# Patient Record
Sex: Female | Born: 1990 | Race: White | Hispanic: No | Smoking: Former smoker
Health system: Southern US, Community
[De-identification: ages and names within clinical notes are randomized; demographics above are authoritative.]

## PROBLEM LIST (undated history)

## (undated) DIAGNOSIS — I10 Essential (primary) hypertension: Secondary | ICD-10-CM

## (undated) DIAGNOSIS — J45909 Unspecified asthma, uncomplicated: Secondary | ICD-10-CM

## (undated) DIAGNOSIS — F329 Major depressive disorder, single episode, unspecified: Secondary | ICD-10-CM

## (undated) HISTORY — PX: TUBAL LIGATION: SHX77

---
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
## 2012-02-27 ENCOUNTER — Ambulatory Visit
Admission: RE | Admit: 2012-02-27 | Discharge: 2012-02-27 | Disposition: A | Discharge status: Home / Self Care | Payer: BC Managed Care – PPO | Source: Ambulatory Visit | Attending: Family Medicine | Admitting: Family Medicine

## 2012-02-27 ENCOUNTER — Other Ambulatory Visit: Payer: Self-pay | Admitting: Family Medicine

## 2012-02-27 DIAGNOSIS — R1031 Right lower quadrant pain: Secondary | ICD-10-CM

## 2012-02-27 MED ORDER — IOHEXOL 300 MG/ML  SOLN
125.0000 mL | Freq: Once | INTRAMUSCULAR | Status: AC | PRN
  Administered 2012-02-27: 125 mL via INTRAVENOUS

## 2017-08-31 ENCOUNTER — Emergency Department (HOSPITAL_COMMUNITY)
Admission: EM | Admit: 2017-08-31 | Discharge: 2017-09-01 | Disposition: A | Discharge status: Other Acute Inpatient Hospital | Payer: Self-pay | Attending: Emergency Medicine | Admitting: Emergency Medicine

## 2017-08-31 ENCOUNTER — Encounter (HOSPITAL_COMMUNITY): Payer: Self-pay

## 2017-08-31 DIAGNOSIS — T1491XA Suicide attempt, initial encounter: Secondary | ICD-10-CM

## 2017-08-31 DIAGNOSIS — Z79899 Other long term (current) drug therapy: Secondary | ICD-10-CM | POA: Insufficient documentation

## 2017-08-31 DIAGNOSIS — T426X2A Poisoning by other antiepileptic and sedative-hypnotic drugs, intentional self-harm, initial encounter: Secondary | ICD-10-CM | POA: Insufficient documentation

## 2017-08-31 DIAGNOSIS — T391X2A Poisoning by 4-Aminophenol derivatives, intentional self-harm, initial encounter: Secondary | ICD-10-CM | POA: Insufficient documentation

## 2017-08-31 DIAGNOSIS — I10 Essential (primary) hypertension: Secondary | ICD-10-CM | POA: Insufficient documentation

## 2017-08-31 DIAGNOSIS — R000 Tachycardia, unspecified: Secondary | ICD-10-CM | POA: Insufficient documentation

## 2017-08-31 DIAGNOSIS — T438X2A Poisoning by other psychotropic drugs, intentional self-harm, initial encounter: Secondary | ICD-10-CM | POA: Insufficient documentation

## 2017-08-31 DIAGNOSIS — T50902A Poisoning by unspecified drugs, medicaments and biological substances, intentional self-harm, initial encounter: Secondary | ICD-10-CM

## 2017-08-31 DIAGNOSIS — Z915 Personal history of self-harm: Secondary | ICD-10-CM | POA: Insufficient documentation

## 2017-08-31 DIAGNOSIS — T424X2A Poisoning by benzodiazepines, intentional self-harm, initial encounter: Secondary | ICD-10-CM | POA: Insufficient documentation

## 2017-08-31 HISTORY — DX: Essential (primary) hypertension: I10

## 2017-08-31 LAB — CBC WITH DIFFERENTIAL/PLATELET
BASOS PCT: 0 %
Basophils Absolute: 0 10*3/uL (ref 0.0–0.1)
EOS ABS: 0.2 10*3/uL (ref 0.0–0.7)
Eosinophils Relative: 2 %
HCT: 35.2 % — ABNORMAL LOW (ref 36.0–46.0)
HEMOGLOBIN: 11.4 g/dL — AB (ref 12.0–15.0)
Lymphocytes Relative: 23 %
Lymphs Abs: 2.1 10*3/uL (ref 0.7–4.0)
MCH: 26.8 pg (ref 26.0–34.0)
MCHC: 32.4 g/dL (ref 30.0–36.0)
MCV: 82.8 fL (ref 78.0–100.0)
Monocytes Absolute: 0.5 10*3/uL (ref 0.1–1.0)
Monocytes Relative: 6 %
NEUTROS PCT: 69 %
Neutro Abs: 6.5 10*3/uL (ref 1.7–7.7)
Platelets: 323 10*3/uL (ref 150–400)
RBC: 4.25 MIL/uL (ref 3.87–5.11)
RDW: 14.6 % (ref 11.5–15.5)
WBC: 9.3 10*3/uL (ref 4.0–10.5)

## 2017-08-31 MED ORDER — AMMONIA AROMATIC IN INHA
RESPIRATORY_TRACT | Status: AC
  Filled 2017-08-31: qty 10

## 2017-08-31 MED ORDER — SODIUM CHLORIDE 0.9 % IV BOLUS (SEPSIS)
1000.0000 mL | Freq: Once | INTRAVENOUS | Status: AC
  Administered 2017-08-31: 1000 mL via INTRAVENOUS

## 2017-08-31 NOTE — ED Notes (Signed)
ED Provider at bedside. 

## 2017-08-31 NOTE — ED Notes (Signed)
Poison control recommendations recheck tylenol at 4 hours of ingestion, check aspirin level, ekg, and iv fluids. They stated if she had seizures to give pt ativan and observe pt for 6-8 hours.

## 2017-08-31 NOTE — ED Triage Notes (Signed)
Pt stated that she took was under stress and was having a panic attack so she took 24 Tylenol PM, 10 gabapentin, 3 Klonopin and 1 Ambien. Pt stated she was not tryng to kill or harm herself.

## 2017-08-31 NOTE — ED Provider Notes (Signed)
[facility] DEPT Provider Note   CSN: [id number] Arrival date & time: 08/31/17  2215     History   Chief Complaint Chief Complaint  Patient presents with  . Drug Overdose    HPI Veronica Waters is a 26 y.o. female.  HPI   26 year old female with history of hypertension, seizure brought here via EMS from home for drug overdose. history is limited as patient is a poor historian.  Patient states that she was having a panic attack due to dealing with a recent divorce and therefore she took much medication to "calm me down". EMS documented that she took 24 Tylenol PM, 10 gabapentin, 3 Klonopin, and 1 Ambien. Patient currently denies intentional overdose to kill herself but does admit that she has tried to intentionally overdose with intention of harming herself 3 separate times in the past 6 months. She denies homicidal ideation, having auditory or visual hallucination. Denies any recent alcohol or illicit drug use. When asked if she is any pain, patient denies. Patient however does appear drowsy. Level 5 caveats applies  Past Medical History:  Diagnosis Date  . Hypertension   . Seizure (HCC)     There are no active problems to display for this patient.   Past Surgical History:  Procedure Laterality Date  . TUBAL LIGATION      OB History    No data available       Home Medications    Prior to Admission medications   Medication Sig Start Date End Date Taking? Authorizing Provider  ARIPiprazole (ABILIFY) 15 MG tablet Take 15 mg by mouth daily.   Yes [provider]  gabapentin (NEURONTIN) 800 MG tablet Take 800 mg by mouth 3 (three) times daily.   Yes [provider]  lisinopril-hydrochlorothiazide (PRINZIDE,ZESTORETIC) 20-12.5 MG tablet Take 1 tablet by mouth daily.   Yes [provider]    Family History History reviewed. No pertinent family history.  Social History Social History  Substance Use Topics  . Smoking status: Never Smoker  .  Smokeless tobacco: Never Used  . Alcohol use No     Allergies   Aspirin; Cranberry; and Vicodin [hydrocodone-acetaminophen]   Review of Systems Review of Systems  Unable to perform ROS: Acuity of condition     Physical Exam Updated Vital Signs BP 115/67 (BP Location: Left Arm)   Pulse (!) 117   Temp 98.1 F (36.7 C) (Oral)   Wt 131.5 kg (290 lb)   LMP  (LMP Unknown)   SpO2 97%   Physical Exam  Constitutional: She is oriented to person, place, and time. She appears well-developed and well-nourished. No distress.  Morbidly obese female laying in bed with eyes closed, easily arousable and answering questions.  HENT:  Head: Atraumatic.  Mouth/Throat: Oropharynx is clear and moist.  Eyes: Conjunctivae are normal.  Pupils are equal, dilated but reactive bilaterally  Neck: Normal range of motion. Neck supple.  No nuchal rigidity  Cardiovascular:  Tachycardia without murmurs rubs or gallops  Pulmonary/Chest:  Breathing with poor effort but no wheezes, rales or rhonchi  Abdominal: Soft. Bowel sounds are normal. She exhibits no distension. There is no tenderness.  Neurological: She is alert and oriented to person, place, and time. No cranial nerve deficit or sensory deficit. GCS eye subscore is 4. GCS verbal subscore is 5. GCS motor subscore is 6.  Able to move all 4 extremities with poor effort  Skin: No rash noted.  Psychiatric: Her affect is blunt. Her speech is delayed.  She is slowed. Thought content is not paranoid. She expresses no homicidal and no suicidal ideation.  Nursing note and vitals reviewed.    ED Treatments / Results  Labs (all labs ordered are listed, but only abnormal results are displayed) Labs Reviewed  CBC WITH DIFFERENTIAL/PLATELET - Abnormal; Notable for the following:       Result Value   Hemoglobin 11.4 (*)    HCT 35.2 (*)    All other components within normal limits  COMPREHENSIVE METABOLIC PANEL - Abnormal; Notable for the following:     Glucose, Bld 115 (*)    Calcium 8.4 (*)    Albumin 3.2 (*)    All other components within normal limits  ETHANOL  SALICYLATE LEVEL  ACETAMINOPHEN LEVEL  PREGNANCY, URINE  URINALYSIS, ROUTINE W REFLEX MICROSCOPIC  RAPID URINE DRUG SCREEN, HOSP PERFORMED    EKG  EKG Interpretation  Date/Time:  Saturday August 31 2017 22:32:33 EDT Ventricular Rate:  118 PR Interval:    QRS Duration: 90 QT Interval:  320 QTC Calculation: 449 R Axis:   74 Text Interpretation:  Sinus tachycardia Borderline T wave abnormalities No previous ECGs available Confirmed by Glynn Octave [phone]) on 08/31/2017 11:33:02 PM       Radiology No results found.  Procedures Procedures (including critical care time)  Medications Ordered in ED Medications  ammonia inhalant (not administered)  sodium chloride 0.9 % bolus 1,000 mL (0 mLs Intravenous Stopped 09/01/17 0008)     Initial Impression / Assessment and Plan / ED Course  I have reviewed the triage vital signs and the nursing notes.  Pertinent labs & imaging results that were available during my care of the patient were reviewed by me and considered in my medical decision making (see chart for details).     BP (!) 127/48   Pulse (!) 106   Temp 98.1 F (36.7 C) (Oral)   Resp 20   Wt 131.5 kg (290 lb)   LMP  (LMP Unknown)   SpO2 96%    Final Clinical Impressions(s) / ED Diagnoses   Final diagnoses:  Intentional drug overdose, initial encounter [facility])    New Prescriptions New Prescriptions   No medications on file   11:01 PM Pt here for evaluation of drug overdose.  Report hx of intentional drug overdose with self harm.  Nurse have contacted poison control who recommend recheck tylenol level at 4 hrs of ingestion, check aspirin level, EKG and IVF.  If pt develop seizure, then give ativan.  Pt to be observe for 6-8 hrs.    Pt is currently protecting her airway, she is tachycardic, IVF given.  Care discussed with Dr. Manus Gunning.    1:08  AM Pt sts she took her meds around 7pm.  However, questionable history.  Plan to recheck tylenol level at 2am.  Will continue monitoring and once pt is medically cleared she will need further evaluation by TTS.  She may need IVC paper.  Care sign out to Dr. Manus Gunning.     Fayrene Helper, PA-C 09/01/17 Gean Maidens, MD 09/01/17 [phone]

## 2017-08-31 NOTE — ED Notes (Signed)
Pt able to state place, year, and but not month, pt believes it's August

## 2017-09-01 ENCOUNTER — Inpatient Hospital Stay (HOSPITAL_COMMUNITY)
Admission: AD | Admit: 2017-09-01 | Discharge: 2017-09-05 | DRG: 885 | Disposition: A | Discharge status: Home / Self Care | Payer: No Typology Code available for payment source | Source: Intra-hospital | Attending: Psychiatry | Admitting: Psychiatry

## 2017-09-01 ENCOUNTER — Encounter (HOSPITAL_COMMUNITY): Payer: Self-pay | Admitting: *Deleted

## 2017-09-01 DIAGNOSIS — Z79899 Other long term (current) drug therapy: Secondary | ICD-10-CM | POA: Diagnosis not present

## 2017-09-01 DIAGNOSIS — F4310 Post-traumatic stress disorder, unspecified: Secondary | ICD-10-CM | POA: Diagnosis present

## 2017-09-01 DIAGNOSIS — G4700 Insomnia, unspecified: Secondary | ICD-10-CM | POA: Diagnosis present

## 2017-09-01 DIAGNOSIS — F603 Borderline personality disorder: Secondary | ICD-10-CM | POA: Diagnosis present

## 2017-09-01 DIAGNOSIS — Z635 Disruption of family by separation and divorce: Secondary | ICD-10-CM | POA: Diagnosis not present

## 2017-09-01 DIAGNOSIS — F332 Major depressive disorder, recurrent severe without psychotic features: Secondary | ICD-10-CM | POA: Diagnosis not present

## 2017-09-01 DIAGNOSIS — F419 Anxiety disorder, unspecified: Secondary | ICD-10-CM | POA: Diagnosis not present

## 2017-09-01 DIAGNOSIS — I10 Essential (primary) hypertension: Secondary | ICD-10-CM | POA: Diagnosis present

## 2017-09-01 DIAGNOSIS — F39 Unspecified mood [affective] disorder: Secondary | ICD-10-CM | POA: Diagnosis not present

## 2017-09-01 DIAGNOSIS — R45851 Suicidal ideations: Secondary | ICD-10-CM | POA: Diagnosis present

## 2017-09-01 DIAGNOSIS — Z885 Allergy status to narcotic agent status: Secondary | ICD-10-CM

## 2017-09-01 DIAGNOSIS — E669 Obesity, unspecified: Secondary | ICD-10-CM | POA: Diagnosis not present

## 2017-09-01 DIAGNOSIS — Z91018 Allergy to other foods: Secondary | ICD-10-CM | POA: Diagnosis not present

## 2017-09-01 DIAGNOSIS — Z915 Personal history of self-harm: Secondary | ICD-10-CM | POA: Diagnosis not present

## 2017-09-01 DIAGNOSIS — F410 Panic disorder [episodic paroxysmal anxiety] without agoraphobia: Secondary | ICD-10-CM | POA: Diagnosis present

## 2017-09-01 DIAGNOSIS — Z62810 Personal history of physical and sexual abuse in childhood: Secondary | ICD-10-CM | POA: Diagnosis present

## 2017-09-01 DIAGNOSIS — F515 Nightmare disorder: Secondary | ICD-10-CM | POA: Diagnosis not present

## 2017-09-01 DIAGNOSIS — G4710 Hypersomnia, unspecified: Secondary | ICD-10-CM | POA: Diagnosis present

## 2017-09-01 DIAGNOSIS — Z6841 Body Mass Index (BMI) 40.0 and over, adult: Secondary | ICD-10-CM | POA: Diagnosis not present

## 2017-09-01 DIAGNOSIS — J45909 Unspecified asthma, uncomplicated: Secondary | ICD-10-CM | POA: Diagnosis present

## 2017-09-01 DIAGNOSIS — R569 Unspecified convulsions: Secondary | ICD-10-CM | POA: Diagnosis present

## 2017-09-01 LAB — ACETAMINOPHEN LEVEL
ACETAMINOPHEN (TYLENOL), SERUM: 42 ug/mL — AB (ref 10–30)
Acetaminophen (Tylenol), Serum: 29 ug/mL (ref 10–30)
Acetaminophen (Tylenol), Serum: 62 ug/mL — ABNORMAL HIGH (ref 10–30)

## 2017-09-01 LAB — URINALYSIS, ROUTINE W REFLEX MICROSCOPIC
Bacteria, UA: NONE SEEN
Bilirubin Urine: NEGATIVE
Glucose, UA: NEGATIVE mg/dL
Ketones, ur: NEGATIVE mg/dL
Leukocytes, UA: NEGATIVE
NITRITE: NEGATIVE
Protein, ur: 30 mg/dL — AB
SPECIFIC GRAVITY, URINE: 1.036 — AB (ref 1.005–1.030)
pH: 5 (ref 5.0–8.0)

## 2017-09-01 LAB — RAPID URINE DRUG SCREEN, HOSP PERFORMED
AMPHETAMINES: NOT DETECTED
Barbiturates: NOT DETECTED
Benzodiazepines: POSITIVE — AB
Cocaine: NOT DETECTED
OPIATES: NOT DETECTED
TETRAHYDROCANNABINOL: NOT DETECTED

## 2017-09-01 LAB — COMPREHENSIVE METABOLIC PANEL
ALBUMIN: 3.2 g/dL — AB (ref 3.5–5.0)
ALT: 27 U/L (ref 14–54)
ANION GAP: 8 (ref 5–15)
AST: 21 U/L (ref 15–41)
Alkaline Phosphatase: 54 U/L (ref 38–126)
BILIRUBIN TOTAL: 0.3 mg/dL (ref 0.3–1.2)
BUN: 10 mg/dL (ref 6–20)
CO2: 22 mmol/L (ref 22–32)
Calcium: 8.4 mg/dL — ABNORMAL LOW (ref 8.9–10.3)
Chloride: 109 mmol/L (ref 101–111)
Creatinine, Ser: 0.55 mg/dL (ref 0.44–1.00)
GFR calc non Af Amer: >60 mL/min (ref >60)
GLUCOSE: 115 mg/dL — AB (ref 65–99)
POTASSIUM: 3.9 mmol/L (ref 3.5–5.1)
Sodium: 139 mmol/L (ref 135–145)
TOTAL PROTEIN: 6.9 g/dL (ref 6.5–8.1)

## 2017-09-01 LAB — PREGNANCY, URINE: Preg Test, Ur: NEGATIVE

## 2017-09-01 LAB — ETHANOL: Alcohol, Ethyl (B): <5 mg/dL (ref <5)

## 2017-09-01 LAB — SALICYLATE LEVEL

## 2017-09-01 MED ORDER — HYDROCHLOROTHIAZIDE 12.5 MG PO CAPS
12.5000 mg | ORAL_CAPSULE | Freq: Every day | ORAL | Status: DC
  Administered 2017-09-01 – 2017-09-05 (×5): 12.5 mg via ORAL
  Filled 2017-09-01 (×9): qty 1

## 2017-09-01 MED ORDER — HYDROXYZINE HCL 25 MG PO TABS
25.0000 mg | ORAL_TABLET | Freq: Four times a day (QID) | ORAL | Status: DC | PRN
  Administered 2017-09-03: 25 mg via ORAL
  Filled 2017-09-01: qty 1

## 2017-09-01 MED ORDER — ARIPIPRAZOLE 15 MG PO TABS
15.0000 mg | ORAL_TABLET | Freq: Every day | ORAL | Status: DC
  Administered 2017-09-01 – 2017-09-02 (×2): 15 mg via ORAL
  Filled 2017-09-01 (×5): qty 1

## 2017-09-01 MED ORDER — CHLORDIAZEPOXIDE HCL 25 MG PO CAPS
25.0000 mg | ORAL_CAPSULE | Freq: Four times a day (QID) | ORAL | Status: DC | PRN
  Administered 2017-09-01 – 2017-09-02 (×3): 25 mg via ORAL
  Filled 2017-09-01 (×3): qty 1

## 2017-09-01 MED ORDER — ONDANSETRON 4 MG PO TBDP: 4.0000 mg | ORAL_TABLET | Freq: Four times a day (QID) | ORAL | Status: DC | PRN

## 2017-09-01 MED ORDER — MAGNESIUM HYDROXIDE 400 MG/5ML PO SUSP: 30.0000 mL | Freq: Every day | ORAL | Status: DC | PRN

## 2017-09-01 MED ORDER — ADULT MULTIVITAMIN W/MINERALS CH
1.0000 | ORAL_TABLET | Freq: Every day | ORAL | Status: DC
  Administered 2017-09-02 – 2017-09-05 (×4): 1 via ORAL
  Filled 2017-09-01 (×8): qty 1

## 2017-09-01 MED ORDER — VITAMIN B-1 100 MG PO TABS
100.0000 mg | ORAL_TABLET | Freq: Every day | ORAL | Status: DC
  Administered 2017-09-02 – 2017-09-05 (×4): 100 mg via ORAL
  Filled 2017-09-01 (×7): qty 1

## 2017-09-01 MED ORDER — THIAMINE HCL 100 MG/ML IJ SOLN: 100.0000 mg | Freq: Once | INTRAMUSCULAR | Status: DC

## 2017-09-01 MED ORDER — LOPERAMIDE HCL 2 MG PO CAPS: 2.0000 mg | ORAL_CAPSULE | ORAL | Status: DC | PRN

## 2017-09-01 MED ORDER — ZOLPIDEM TARTRATE 5 MG PO TABS
5.0000 mg | ORAL_TABLET | Freq: Once | ORAL | Status: AC | PRN
  Administered 2017-09-01: 5 mg via ORAL
  Filled 2017-09-01: qty 1

## 2017-09-01 MED ORDER — VORTIOXETINE HBR 10 MG PO TABS
1.0000 | ORAL_TABLET | Freq: Every day | ORAL | Status: DC
  Filled 2017-09-01 (×3): qty 1

## 2017-09-01 MED ORDER — LISINOPRIL-HYDROCHLOROTHIAZIDE 20-12.5 MG PO TABS: 1.0000 | ORAL_TABLET | Freq: Every day | ORAL | Status: DC

## 2017-09-01 MED ORDER — GABAPENTIN 400 MG PO CAPS
800.0000 mg | ORAL_CAPSULE | Freq: Three times a day (TID) | ORAL | Status: DC
  Administered 2017-09-01 – 2017-09-02 (×3): 800 mg via ORAL
  Filled 2017-09-01 (×8): qty 2

## 2017-09-01 MED ORDER — VORTIOXETINE HBR 5 MG PO TABS
10.0000 mg | ORAL_TABLET | Freq: Every day | ORAL | Status: DC
  Administered 2017-09-01 – 2017-09-02 (×2): 10 mg via ORAL
  Filled 2017-09-01 (×4): qty 2

## 2017-09-01 MED ORDER — GABAPENTIN 400 MG PO CAPS
800.0000 mg | ORAL_CAPSULE | Freq: Once | ORAL | Status: AC
  Administered 2017-09-01: 800 mg via ORAL
  Filled 2017-09-01 (×2): qty 2

## 2017-09-01 MED ORDER — LISINOPRIL 20 MG PO TABS
20.0000 mg | ORAL_TABLET | Freq: Every day | ORAL | Status: DC
  Administered 2017-09-01 – 2017-09-05 (×5): 20 mg via ORAL
  Filled 2017-09-01 (×9): qty 1

## 2017-09-01 MED ORDER — TRAZODONE HCL 50 MG PO TABS: 50.0000 mg | ORAL_TABLET | Freq: Every evening | ORAL | Status: DC | PRN

## 2017-09-01 MED ORDER — ALUM & MAG HYDROXIDE-SIMETH 200-200-20 MG/5ML PO SUSP: 30.0000 mL | ORAL | Status: DC | PRN

## 2017-09-01 MED ORDER — SODIUM CHLORIDE 0.9 % IV BOLUS (SEPSIS)
1000.0000 mL | Freq: Once | INTRAVENOUS | Status: AC
Start: 2017-09-01 — End: 2017-09-01
  Administered 2017-09-01: 1000 mL via INTRAVENOUS

## 2017-09-01 MED ORDER — ALBUTEROL SULFATE HFA 108 (90 BASE) MCG/ACT IN AERS: 1.0000 | INHALATION_SPRAY | Freq: Four times a day (QID) | RESPIRATORY_TRACT | Status: DC | PRN

## 2017-09-01 MED ORDER — HYDROXYZINE HCL 25 MG PO TABS: 25.0000 mg | ORAL_TABLET | Freq: Three times a day (TID) | ORAL | Status: DC | PRN
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
## 2017-09-01 NOTE — BH Assessment (Signed)
Called to do a tele assessment.  Spoke with patient's nurse who indicated that patient was not alert enough to assess at this time.
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
## 2017-09-01 NOTE — ED Notes (Signed)
[place] RN at [facility] states this patient's case is closed and she is medically clear from their standpoint.

## 2017-09-01 NOTE — Progress Notes (Signed)
Writer observed patient up in the dayroom writing in her journal or coloring. She had minimal to no interaction with peers. Writer spoke with her 1:1 about her medications available and she became very upset and angry because she reported that she takes Ambien and cursed saying, " you don't understand I don't need to be in this fucking place." She inquired about her neurontin asking if she be getting anymore at bedtime. Writer explained to her that I would speak with NP on sight and see if a one time order could be given until she see the doctor on tomorrow. She just went silent and walked off. Writer received order an notified her, she was on the phone crying at the time. Safety maintained on unit with 15 min checks.

## 2017-09-01 NOTE — Progress Notes (Signed)
[facility] Group Notes:  (Nursing/MHT/Case Management/Adjunct)  Date:  09/01/2017  Time:  2045 Type of Therapy:  wrap up group  Participation Level:  Active  Participation Quality:  Resistant and Supportive  Affect:  Depressed, Irritable and Tearful  Cognitive:  Appropriate  Insight:  Lacking  Engagement in Group:  Limited  Modes of Intervention:  Clarification, Education and Support  Summary of Progress/Problems: Pt shares one good thing was that her mother and best friend visited her but is unhappy that she is forced to be here, pt is involuntarily committed. Pt states that her mother understanding her more would be emotionally supportive and having help with her bills would be a physical support. Pt is unable to feel grateful for anything in her life right now.   Veronica Waters, Veronica Waters 09/01/2017, 10:35 PM

## 2017-09-01 NOTE — Tx Team (Signed)
Initial Treatment Plan 09/01/2017 8:17 PM Veronica Waters [id number]    PATIENT STRESSORS: Educational concerns Financial difficulties Health problems Marital or family conflict   PATIENT STRENGTHS: Ability for insight Active sense of humor Average or above average intelligence Capable of independent living   PATIENT IDENTIFIED PROBLEMS: Depression  Overdose          " I didn't want to die"  " Life has been stressful"       DISCHARGE CRITERIA:  Ability to meet basic life and health needs Adequate post-discharge living arrangements Improved stabilization in mood, thinking, and/or behavior  PRELIMINARY DISCHARGE PLAN: Attend aftercare/continuing care group Attend PHP/IOP Attend 12-step recovery group Outpatient therapy  PATIENT/FAMILY INVOLVEMENT: This treatment plan has been presented to and reviewed with the patient, Veronica Waters, and/or family member, .  The patient and family have been given the opportunity to ask questions and make suggestions.  Rich Braveuke, Remas Sobel Lynn, RN 09/01/2017, 8:17 PM

## 2017-09-02 DIAGNOSIS — E669 Obesity, unspecified: Secondary | ICD-10-CM

## 2017-09-02 DIAGNOSIS — Z635 Disruption of family by separation and divorce: Secondary | ICD-10-CM

## 2017-09-02 DIAGNOSIS — Z6841 Body Mass Index (BMI) 40.0 and over, adult: Secondary | ICD-10-CM

## 2017-09-02 DIAGNOSIS — F332 Major depressive disorder, recurrent severe without psychotic features: Principal | ICD-10-CM

## 2017-09-02 DIAGNOSIS — R45851 Suicidal ideations: Secondary | ICD-10-CM

## 2017-09-02 MED ORDER — GABAPENTIN 300 MG PO CAPS
600.0000 mg | ORAL_CAPSULE | Freq: Three times a day (TID) | ORAL | Status: DC
  Administered 2017-09-02 – 2017-09-05 (×9): 600 mg via ORAL
  Filled 2017-09-02: qty 2
  Filled 2017-09-02 (×4): qty 42
  Filled 2017-09-02 (×2): qty 2
  Filled 2017-09-02: qty 42
  Filled 2017-09-02: qty 6
  Filled 2017-09-02 (×8): qty 2
  Filled 2017-09-02: qty 42
  Filled 2017-09-02 (×2): qty 2

## 2017-09-02 MED ORDER — CLONAZEPAM 0.5 MG PO TABS
0.5000 mg | ORAL_TABLET | Freq: Three times a day (TID) | ORAL | Status: DC | PRN
  Administered 2017-09-02 – 2017-09-05 (×9): 0.5 mg via ORAL
  Filled 2017-09-02 (×9): qty 1

## 2017-09-02 MED ORDER — PRAZOSIN HCL 1 MG PO CAPS
1.0000 mg | ORAL_CAPSULE | Freq: Every day | ORAL | Status: DC
  Administered 2017-09-02 – 2017-09-03 (×2): 1 mg via ORAL
  Filled 2017-09-02: qty 7
  Filled 2017-09-02 (×3): qty 1
  Filled 2017-09-02: qty 7
  Filled 2017-09-02 (×3): qty 1

## 2017-09-02 MED ORDER — DULOXETINE HCL 30 MG PO CPEP
30.0000 mg | ORAL_CAPSULE | Freq: Every day | ORAL | Status: DC
  Administered 2017-09-03: 30 mg via ORAL
  Filled 2017-09-02 (×3): qty 1

## 2017-09-02 MED ORDER — LURASIDONE HCL 20 MG PO TABS
20.0000 mg | ORAL_TABLET | Freq: Every day | ORAL | Status: DC
  Administered 2017-09-03 – 2017-09-05 (×3): 20 mg via ORAL
  Filled 2017-09-02: qty 1
  Filled 2017-09-02: qty 7
  Filled 2017-09-02 (×2): qty 1
  Filled 2017-09-02: qty 7
  Filled 2017-09-02: qty 1

## 2017-09-02 MED ORDER — ZOLPIDEM TARTRATE 5 MG PO TABS
5.0000 mg | ORAL_TABLET | Freq: Every day | ORAL | Status: DC
  Filled 2017-09-02: qty 1
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
## 2017-09-02 NOTE — Progress Notes (Signed)
DAR NOTE: Pt present with flat affect and depressed mood in the unit. Pt has been isolating himself most of the time stating she did not sleep well last night . Pt denies physical pain, took all her meds as scheduled. As per self inventory, pt had a poor night sleep, fair appetite, low energy, and good concentration. Pt rate depression at 9, hopeless ness at 8, and anxiety at 8. Pt stated her goal is to "stay positive and learn coping skills." Pt's safety ensured with 15 minute and environmental checks. Pt currently endorses passive SI, but denies HI and A/V hallucinations. Pt verbally agrees to seek staff if SI/HI or A/VH occurs and to consult with staff before acting on these thoughts. Will continue POC.

## 2017-09-02 NOTE — Plan of Care (Signed)
Problem: Coping: Goal: Ability to interact with others will improve Outcome: Progressing Pt informed the staff about her being suicidal

## 2017-09-02 NOTE — BHH Group Notes (Signed)
LCSW Group Therapy Note   09/02/2017 1:15pm   Type of Therapy and Topic:  Group Therapy:  Overcoming Obstacles   Participation Level:  Minimal   Description of Group:    In this group patients will be encouraged to explore what they see as obstacles to their own wellness and recovery. They will be guided to discuss their thoughts, feelings, and behaviors related to these obstacles. The group will process together ways to cope with barriers, with attention given to specific choices patients can make. Each patient will be challenged to identify changes they are motivated to make in order to overcome their obstacles. This group will be process-oriented, with patients participating in exploration of their own experiences as well as giving and receiving support and challenge from other group members.   Therapeutic Goals: 1. Patient will identify personal and current obstacles as they relate to admission. 2. Patient will identify barriers that currently interfere with their wellness or overcoming obstacles.  3. Patient will identify feelings, thought process and behaviors related to these barriers. 4. Patient will identify two changes they are willing to make to overcome these obstacles:      Summary of Patient Progress   Veronica Waters was attentive but did not actively participate in discussion unless prompted. She shared that her goal was to "return to work." Veronica Waters continues to show progress in the group setting with limited insight.    Therapeutic Modalities:   Cognitive Behavioral Therapy Solution Focused Therapy Motivational Interviewing Relapse Prevention Therapy  Ledell PeoplesHeather N Smart, LCSW 09/02/2017 2:16 PM

## 2017-09-02 NOTE — BHH Suicide Risk Assessment (Signed)
BHH INPATIENT:  Family/Significant Other Suicide Prevention Education  Suicide Prevention Education:  Patient Refusal for Family/Significant Other Suicide Prevention Education: The patient Veronica Waters has refused to provide written consent for family/significant other to be provided Family/Significant Other Suicide Prevention Education during admission and/or prior to discharge.  Physician notified.  Verdene LennertLauren C Byrne Capek 09/02/2017, 12:37 PM

## 2017-09-02 NOTE — Progress Notes (Signed)
Recreation Therapy Notes  Date: 09/02/17 Time: 0930 Location: 400 Hall Dayroom  Group Topic: Stress Management  Goal Area(s) Addresses:  Patient will verbalize importance of using healthy stress management.  Patient will identify positive emotions associated with healthy stress management.   Behavioral Response: Engaged  Intervention: Stress Management  Activity :  Meditation.   LRT introduced the stress management technique of meditation.  LRT played Waters meditation from the Calm app that focused on scanning the body.  Patients were to focus on and make not of whatever sensations they were feeling.  Education:  Stress Management, Discharge Planning.   Education Outcome: Acknowledges edcuation/In group clarification offered/Needs additional education  Clinical Observations/Feedback: Pt attended group.   Veronica Waters, LRT/CTRS         Veronica Waters 09/02/2017 12:02 PM 

## 2017-09-03 LAB — TSH: TSH: 3.994 u[IU]/mL (ref 0.350–4.500)

## 2017-09-03 MED ORDER — DULOXETINE HCL 20 MG PO CPEP
40.0000 mg | ORAL_CAPSULE | Freq: Every day | ORAL | Status: DC
  Administered 2017-09-04 – 2017-09-05 (×2): 40 mg via ORAL
  Filled 2017-09-03 (×2): qty 2
  Filled 2017-09-03 (×2): qty 14
  Filled 2017-09-03 (×2): qty 2

## 2017-09-03 NOTE — Progress Notes (Signed)
[facility] MD Progress Note  09/03/2017 12:30 PM Veronica Waters  MRN:  [id number] Subjective:  Patient states " I am feeling anxious today". Denies suicidal ideations. Describes ongoing depression, and describes ongoing decreased energy level, decreased enjoyment. Reports hypersomnia. Denies medication side effects.  Objective : I have discussed case with treatment team and have met with patient. Patient presents with depressed mood and flat affect, but does smile at times appropriately. As above, reports ongoing depression, anxiety, but today denies suicidal ideations. Denies hallucinations, does not appear internally preoccupied. She has been going to some groups, no disruptive or agitated behaviors on unit . Thus far tolerating medications well ( currently on Latuda, Cymbalta, Minipress  )  Labs - TSH 3.99    Principal Problem: MDD (major depressive disorder), recurrent severe, without psychosis ([place]) Diagnosis:   Patient Active Problem List   Diagnosis Date Noted  . MDD (major depressive disorder), recurrent severe, without psychosis ([place]) [F33.2] 09/01/2017   Total Time spent with patient: 20 minutes   Past Medical History:  Past Medical History:  Diagnosis Date  . Hypertension   . Seizure [facility])     Past Surgical History:  Procedure Laterality Date  . TUBAL LIGATION     Family History: History reviewed. No pertinent family history. Social History:  History  Alcohol Use No     History  Drug use: Unknown    Social History   Social History  . Marital status: Single    Spouse name: N/A  . Number of children: N/A  . Years of education: N/A   Social History Main Topics  . Smoking status: Never Smoker  . Smokeless tobacco: Never Used  . Alcohol use No  . Drug use: Unknown  . Sexual activity: Not Asked   Other Topics Concern  . None   Social History Narrative  . None   Additional Social History:    Pain Medications: n/a Negative Consequences of Use: Financial,  Legal Withdrawal Symptoms: Agitation  Sleep: Fair  Appetite:  Fair  Current Medications: Current Facility-Administered Medications  Medication Dose Route Frequency Provider Last Rate Last Dose  . albuterol (PROVENTIL HFA;VENTOLIN HFA) 108 (90 Base) MCG/ACT inhaler 1-2 puff  1-2 puff Inhalation Q6H PRN Okonkwo, Justina A, NP      . alum & mag hydroxide-simeth (MAALOX/MYLANTA) 200-200-20 MG/5ML suspension 30 mL  30 mL Oral Q4H PRN Okonkwo, Justina A, NP      . clonazePAM (KLONOPIN) tablet 0.5 mg  0.5 mg Oral TID PRN , Myer Peer, MD   0.5 mg at 09/03/17 0821  . DULoxetine (CYMBALTA) DR capsule 30 mg  30 mg Oral Daily , Myer Peer, MD   30 mg at 09/03/17 0815  . gabapentin (NEURONTIN) capsule 600 mg  600 mg Oral TID , Myer Peer, MD   600 mg at 09/03/17 1200  . lisinopril (PRINIVIL,ZESTRIL) tablet 20 mg  20 mg Oral Daily , Myer Peer, MD   20 mg at 09/03/17 0815   And  . hydrochlorothiazide (MICROZIDE) capsule 12.5 mg  12.5 mg Oral Daily ,  A, MD   12.5 mg at 09/03/17 0815  . hydrOXYzine (ATARAX/VISTARIL) tablet 25 mg  25 mg Oral Q6H PRN Arfeen, Arlyce Harman, MD      . loperamide (IMODIUM) capsule 2-4 mg  2-4 mg Oral PRN Arfeen, Arlyce Harman, MD      . lurasidone (LATUDA) tablet 20 mg  20 mg Oral Q breakfast , Myer Peer, MD   20 mg at 09/03/17 0815  .  magnesium hydroxide (MILK OF MAGNESIA) suspension 30 mL  30 mL Oral Daily PRN Okonkwo, Justina A, NP      . multivitamin with minerals tablet 1 tablet  1 tablet Oral Daily Arfeen, Arlyce Harman, MD   1 tablet at 09/03/17 0815  . ondansetron (ZOFRAN-ODT) disintegrating tablet 4 mg  4 mg Oral Q6H PRN Arfeen, Arlyce Harman, MD      . prazosin (MINIPRESS) capsule 1 mg  1 mg Oral QHS , Myer Peer, MD   1 mg at 09/02/17 2112  . thiamine (B-1) injection 100 mg  100 mg Intramuscular Once Arfeen, Dossie Der T, MD      . thiamine (VITAMIN B-1) tablet 100 mg  100 mg Oral Daily Arfeen, Arlyce Harman, MD   100 mg at 09/03/17 6168    Lab Results:   Results for orders placed or performed during the hospital encounter of 09/01/17 (from the past 48 hour(s))  TSH     Status: None   Collection Time: 09/03/17  6:13 AM  Result Value Ref Range   TSH 3.994 0.350 - 4.500 uIU/mL    Comment: Performed by a 3rd Generation assay with a functional sensitivity of <=0.01 uIU/mL. Performed at [facility], [place] [address] [place], [place] [address]     Blood Alcohol level:  Lab Results  Component Value Date   ETH <5 21/10/5519    Metabolic Disorder Labs: No results found for: HGBA1C, MPG No results found for: PROLACTIN No results found for: CHOL, TRIG, HDL, CHOLHDL, VLDL, LDLCALC  Physical Findings: AIMS: Facial and Oral Movements Muscles of Facial Expression: None, normal Lips and Perioral Area: None, normal Jaw: None, normal Tongue: None, normal,Extremity Movements Upper (arms, wrists, hands, fingers): None, normal Lower (legs, knees, ankles, toes): None, normal, Trunk Movements Neck, shoulders, hips: None, normal, Overall Severity Severity of abnormal movements (highest score from questions above): None, normal Incapacitation due to abnormal movements: None, normal Patient's awareness of abnormal movements (rate only patient's report): No Awareness, Dental Status Current problems with teeth and/or dentures?: No Does patient usually wear dentures?: No  CIWA:  CIWA-Ar Total: 3 COWS:  COWS Total Score: 0  Musculoskeletal: Strength & Muscle Tone: within normal limits Gait & Station: normal Patient leans: N/A  Psychiatric Specialty Exam: Physical Exam  ROS describes mild headache, denies chest pain, no shortness of breath, no vomiting  Blood pressure 124/67, pulse (!) 123, temperature 98.7 F (37.1 C), temperature source Oral, resp. rate 18, height 5' 7" (1.702 m), weight 131.5 kg (290 lb), SpO2 100 %.Body mass index is 45.42 kg/m.  General Appearance: Fairly Groomed  Eye Contact:  Good  Speech:  Normal Rate   Volume:  Normal  Mood:  Depressed  Affect:  remains constricted, anxious, but smiles at times appropriately  Thought Process:  Linear and Descriptions of Associations: Intact  Orientation:  Full (Time, Place, and Person)  Thought Content:  no hallucinations, no delusions, not internally preoccupied   Suicidal Thoughts:  No denies any current suicidal ideations, denies any homicidal or violent ideations  Homicidal Thoughts:  No  Memory:  recent and remote grossly intact   Judgement:  Fair  Insight:  Fair  Psychomotor Activity:  Decreased  Concentration:  Concentration: Good and Attention Span: Good  Recall:  Good  Fund of Knowledge:  Good  Language:  Good  Akathisia:  Negative  Handed:  Right  AIMS (if indicated):     Assets:  Desire for Improvement Resilience  ADL's:  Intact  Cognition:  WNL  Sleep:  Number of Hours: 4.75   Assessment - At this time patient remains depressed, vaguely anxious, and endorsing neuro-vegetative symptoms such as decreased energy, anhedonia. No psychotic symptoms, no current suicidal ideations, tolerating medications well at present.   Treatment Plan Summary: Daily contact with patient to assess and evaluate symptoms and progress in treatment, Medication management, Plan inpatient treatment  and medications as below Encourage ongoing group and milieu participation to work on coping skills and symptom reduction Continue Klonopin 0.5 mgrs TID PRN for anxiety Continue Neurontin 600 mgrs TID for anxiety,pain Increase Cymbalta to 40 mgrs QDAY for depression, anxiety Continue Minipress 1 mgr QHS for nightmares  Continue Latuda 20 mgrs QDAY for mood disorder  Treatment team working on disposition [place], MD 09/03/2017, 12:30 PM

## 2017-09-03 NOTE — Progress Notes (Signed)
D: Patient reports ongoing anxiety and requested her clonopin twice today. She presents with flat, blunted affect and depressed and anxious mood.  She rates her depression as a 10; hopelessness and anxiety as an 8.  Her goal is to "stay positive and start my new meds."  She denies any thoughts of self harm.   A: Continue to monitor medication management and MD orders.  Safety checks completed every 15 minutes per protocol.  Offer support and encouragement as needed. R: Patient remains isolative and withdrawn.

## 2017-09-03 NOTE — Plan of Care (Signed)
Problem: Safety: Goal: Periods of time without injury will increase Outcome: Progressing Pt safe on the unit at this time   

## 2017-09-03 NOTE — Progress Notes (Signed)
Adult Psychoeducational Group Note  Date:  09/03/2017 Time:  5:16 AM  Group Topic/Focus:  Wrap-Up Group:   The focus of this group is to help patients review their daily goal of treatment and discuss progress on daily workbooks.  Participation Level:  Active  Participation Quality:  Appropriate  Affect:  Appropriate  Cognitive:  Appropriate  Insight: Appropriate  Engagement in Group:  Engaged  Modes of Intervention:  Discussion  Additional Comments:  Pt stated her goal for today was to get some rest and learned some coping skills to use when she become upset. Pt stated she felt good when she achieved her goal. Pt rated her over all day was a 4 out of 10.  Felipa FurnaceChristopher  Wilhemenia Camba 09/03/2017, 5:16 AM

## 2017-09-03 NOTE — Progress Notes (Signed)
D: Pt  SI- contracts denies HI/AVH. Pt is pleasant and cooperative. Pt stated she was very anxious .   A: Pt was offered support and encouragement. Pt was given scheduled medications. Pt was encourage to attend groups. Q 15 minute checks were done for safety.    R:Pt attends groups and interacts well with peers and staff. Pt is taking medication. Pt receptive to treatment and safety maintained on unit.

## 2017-09-03 NOTE — [facility] Group Notes (Signed)
LCSW Group Therapy Note  09/03/2017 1:15pm  Type of Therapy/Topic:  Group Therapy:  Feelings about Diagnosis  Participation Level:  Active   Description of Group:   This group will allow patients to explore their thoughts and feelings about diagnoses they have received. Patients will be guided to explore their level of understanding and acceptance of these diagnoses. Facilitator will encourage patients to process their thoughts and feelings about the reactions of others to their diagnosis and will guide patients in identifying ways to discuss their diagnosis with significant others in their lives. This group will be process-oriented, with patients participating in exploration of their own experiences, giving and receiving support, and processing challenge from other group members.   Therapeutic Goals: 1. Patient will demonstrate understanding of diagnosis as evidenced by identifying two or more symptoms of the disorder 2. Patient will be able to express two feelings regarding the diagnosis 3. Patient will demonstrate their ability to communicate their needs through discussion and/or role play  Summary of Patient Progress: Pt reports feeling more motivated for treatment and wold like to utilize her support system in a more effective way. Pt offered validation to peers and encouragement regarding their depression.   Therapeutic Modalities:   Cognitive Behavioral Therapy Brief Therapy Feelings Identification    Veronica LennertLauren C Brycen Bean, LCSW 09/03/2017 3:58 PM

## 2017-09-04 DIAGNOSIS — F39 Unspecified mood [affective] disorder: Secondary | ICD-10-CM

## 2017-09-04 DIAGNOSIS — F515 Nightmare disorder: Secondary | ICD-10-CM

## 2017-09-04 DIAGNOSIS — F419 Anxiety disorder, unspecified: Secondary | ICD-10-CM

## 2017-09-04 MED ORDER — ACETAMINOPHEN 325 MG PO TABS
650.0000 mg | ORAL_TABLET | Freq: Once | ORAL | Status: AC
  Administered 2017-09-04: 650 mg via ORAL
  Filled 2017-09-04 (×2): qty 2

## 2017-09-04 MED ORDER — PROPRANOLOL HCL 10 MG PO TABS
ORAL_TABLET | ORAL | Status: AC
  Filled 2017-09-04: qty 2

## 2017-09-04 MED ORDER — PROPRANOLOL HCL 20 MG PO TABS
20.0000 mg | ORAL_TABLET | Freq: Two times a day (BID) | ORAL | Status: DC
  Administered 2017-09-04 – 2017-09-05 (×3): 20 mg via ORAL
  Filled 2017-09-04 (×4): qty 1

## 2017-09-04 NOTE — Progress Notes (Signed)
[facility]  09/04/2017 1:38 PM Veronica Waters  MRN:  [id number]   Subjective:  Patient reports feeling much better today. She still has some complaint of anxiety. She also reports that her elevated HR has been there for a long time and the Propranolol she was taking helped with that. She reports that she has a plan to follow up at [facility] and is looking to go back to Partial hospitalization after discharge. She is requesting Dr. Jama Flavorsobos discharge her tomorrow.  Objective : Patient Is cooperative and pleasant. Will start her on Propranolol 20 mg PO BID for anxiety, with the plan that it will help with her HR as well. No decision has been made about discharge yet. She has been going to some groups, no disruptive or agitated behaviors on unit . Continuing to tolerate medications well ( currently on Latuda, Cymbalta, Minipress  )      Principal Problem: MDD (major depressive disorder), recurrent severe, without psychosis (HCC) Diagnosis:   Patient Active Problem List   Diagnosis Date Noted  . MDD (major depressive disorder), recurrent severe, without psychosis (HCC) [F33.2] 09/01/2017   Total Time spent with patient: 25 minutes   Past Medical History:  Past Medical History:  Diagnosis Date  . Hypertension   . Seizure [facility](HCC)     Past Surgical History:  Procedure Laterality Date  . TUBAL LIGATION     Family History: History reviewed. No pertinent family history. Social History:  History  Alcohol Use No     History  Drug use: Unknown    Social History   Social History  . Marital status: Single    Spouse name: N/A  . Number of children: N/A  . Years of education: N/A   Social History Main Topics  . Smoking status: Never Smoker  . Smokeless tobacco: Never Used  . Alcohol use No  . Drug use: Unknown  . Sexual activity: Not Asked   Other Topics Concern  . None   Social History Narrative  . None   Additional Social History:    Pain Medications: n/a Negative  Consequences of Use: Financial, Legal Withdrawal Symptoms: Agitation  Sleep: Fair  Appetite:  Fair  Current Medications: Current Facility-Administered Medications  Medication Dose Route Frequency Provider Last Rate Last Dose  . albuterol (PROVENTIL HFA;VENTOLIN HFA) 108 (90 Base) MCG/ACT inhaler 1-2 puff  1-2 puff Inhalation Q6H PRN Okonkwo, Justina A, NP      . alum & mag hydroxide-simeth (MAALOX/MYLANTA) 200-200-20 MG/5ML suspension 30 mL  30 mL Oral Q4H PRN Okonkwo, Justina A, NP      . clonazePAM (KLONOPIN) tablet 0.5 mg  0.5 mg Oral TID PRN Cobos, Rockey SituFernando A, MD   0.5 mg at 09/04/17 1334  . DULoxetine (CYMBALTA) DR capsule 40 mg  40 mg Oral Daily Cobos, Rockey SituFernando A, MD   40 mg at 09/04/17 0759  . gabapentin (NEURONTIN) capsule 600 mg  600 mg Oral TID Cobos, Rockey SituFernando A, MD   600 mg at 09/04/17 1208  . lisinopril (PRINIVIL,ZESTRIL) tablet 20 mg  20 mg Oral Daily Cobos, Rockey SituFernando A, MD   20 mg at 09/04/17 0759   And  . hydrochlorothiazide (MICROZIDE) capsule 12.5 mg  12.5 mg Oral Daily Cobos, Rockey SituFernando A, MD   12.5 mg at 09/04/17 0759  . hydrOXYzine (ATARAX/VISTARIL) tablet 25 mg  25 mg Oral Q6H PRN Cleotis NipperArfeen, Syed T, MD   25 mg at 09/03/17 2102  . loperamide (IMODIUM) capsule 2-4 mg  2-4 mg Oral  PRN Cleotis Nipper, MD      . lurasidone (LATUDA) tablet 20 mg  20 mg Oral Q breakfast Cobos, Rockey Situ, MD   20 mg at 09/04/17 0759  . magnesium hydroxide (MILK OF MAGNESIA) suspension 30 mL  30 mL Oral Daily PRN Okonkwo, Justina A, NP      . multivitamin with minerals tablet 1 tablet  1 tablet Oral Daily Arfeen, Phillips Grout, MD   1 tablet at 09/04/17 0759  . ondansetron (ZOFRAN-ODT) disintegrating tablet 4 mg  4 mg Oral Q6H PRN Arfeen, Syed T, MD      . prazosin (MINIPRESS) capsule 1 mg  1 mg Oral QHS Cobos, Rockey Situ, MD   1 mg at 09/03/17 2102  . propranolol (INDERAL) tablet 20 mg  20 mg Oral BID Money, Feliz Beam B, FNP      . thiamine (B-1) injection 100 mg  100 mg Intramuscular Once Arfeen, Kathi Ludwig T, MD       . thiamine (VITAMIN B-1) tablet 100 mg  100 mg Oral Daily Arfeen, Phillips Grout, MD   100 mg at 09/04/17 1610    Lab Results:  Results for orders placed or performed during the hospital encounter of 09/01/17 (from the past 48 hour(s))  TSH     Status: None   Collection Time: 09/03/17  6:13 AM  Result Value Ref Range   TSH 3.994 0.350 - 4.500 uIU/mL    Comment: Performed by a 3rd Generation assay with a functional sensitivity of <=0.01 uIU/mL. Performed at [facility], [address], [place], [place] [address]     Blood Alcohol level:  Lab Results  Component Value Date   ETH <5 08/31/2017    Metabolic Disorder Labs: No results found for: HGBA1C, MPG No results found for: PROLACTIN No results found for: CHOL, TRIG, HDL, CHOLHDL, VLDL, LDLCALC  Physical Findings: AIMS: Facial and Oral Movements Muscles of Facial Expression: None, normal Lips and Perioral Area: None, normal Jaw: None, normal Tongue: None, normal,Extremity Movements Upper (arms, wrists, hands, fingers): None, normal Lower (legs, knees, ankles, toes): None, normal, Trunk Movements Neck, shoulders, hips: None, normal, Overall Severity Severity of abnormal movements (highest score from questions above): None, normal Incapacitation due to abnormal movements: None, normal Patient's awareness of abnormal movements (rate only patient's report): No Awareness, Dental Status Current problems with teeth and/or dentures?: No Does patient usually wear dentures?: No  CIWA:  CIWA-Ar Total: 3 COWS:  COWS Total Score: 0  Musculoskeletal: Strength & Muscle Tone: within normal limits Gait & Station: normal Patient leans: N/A  Psychiatric Specialty Exam: Physical Exam  Nursing note and vitals reviewed. Constitutional: She is oriented to person, place, and time. She appears well-developed and well-nourished.  Cardiovascular: Normal rate.   Respiratory: Effort normal.  Musculoskeletal: Normal range of  motion.  Neurological: She is alert and oriented to person, place, and time.  Skin: Skin is warm.    Review of Systems  Constitutional: Negative.   HENT: Negative.   Eyes: Negative.   Respiratory: Negative.   Cardiovascular: Negative.   Gastrointestinal: Negative.   Genitourinary: Negative.   Musculoskeletal: Negative.   Skin: Negative.   Neurological: Negative.   Endo/Heme/Allergies: Negative.    describes mild headache, denies chest pain, no shortness of breath, no vomiting  Blood pressure 133/60, pulse (!) 128, temperature 98.6 F (37 C), temperature source Oral, resp. rate 20, height  (1.702 m), weight 131.5 kg (290 lb), SpO2 100 %.Body mass index is 45.42 kg/m.  General Appearance: Fairly Groomed  Eye Contact:  Good  Speech:  Normal Rate  Volume:  Normal  Mood:  Euthymic  Affect:  remains constricted, anxious, but smiles at times appropriately  Thought Process:  Linear and Descriptions of Associations: Intact  Orientation:  Full (Time, Place, and Person)  Thought Content:  WDL  Suicidal Thoughts:  No   Homicidal Thoughts:  No  Memory:  recent and remote grossly intact   Judgement:  Fair  Insight:  Fair  Psychomotor Activity:  Decreased  Concentration:  Concentration: Good and Attention Span: Good  Recall:  Good  Fund of Knowledge:  Good  Language:  Good  Akathisia:  Negative  Handed:  Right  AIMS (if indicated):     Assets:  Desire for Improvement Resilience  ADL's:  Intact  Cognition:  WNL  Sleep:  Number of Hours: 6   Treatment Plan Summary: Daily contact with patient to assess and evaluate symptoms and progress in treatment, Medication management, Plan inpatient treatment  and medications as below   -Encourage ongoing group and milieu participation to work on Pharmacologist and symptom reduction -Start Propranolol 20 mg PO BID for anxiety and reduce heart rate -Continue Klonopin 0.5 mgrs TID PRN for anxiety -Continue Neurontin 600 mgrs TID for  anxiety,pain -Continue Cymbalta to 40 mgrs QDAY for depression, anxiety -Continue Minipress 1 mgr QHS for nightmares  -Continue Latuda 20 mgrs QDAY for mood disorder  -Treatment team working on disposition planning   Maryfrances Bunnell, FNP 09/04/2017, 1:38 PM   Agree with NP Progress Note

## 2017-09-04 NOTE — Progress Notes (Signed)
D: Pt passive SI-contracts for safety. Pt is pleasant and cooperative. Pt stated she was feeling worse earlier than she did when she came in due to not being able to talk to her family like she wants to. Pt was very depressed this evening and endorsed feelings of anxiety, but pt did brighten up and talk with writer for over 20 minutes and appeared to have a brighter affect. Pt stated she kind of likes being by herself, but pt was encouraged to interact more with her peers on the unit.   A: Pt was offered support and encouragement. Pt was given scheduled medications. Pt was encourage to attend groups. Q 15 minute checks were done for safety.   R:Pt attends groups and interacts well with peers and staff. Pt is taking medication. Pt has no complaints.Pt receptive to treatment and safety maintained on unit.

## 2017-09-04 NOTE — [facility] Group Notes (Signed)
[facility] Mental Health Association Group Therapy 09/04/2017 1:15pm  Type of Therapy: Mental Health Association Presentation  Participation Level: Active  Participation Quality: Attentive  Affect: Appropriate  Cognitive: Oriented  Insight: Developing/Improving  Engagement in Therapy: Engaged  Modes of Intervention: Discussion, Education and Socialization  Summary of Progress/Problems: Mental Health Association (MHA) Speaker came to talk about his personal journey with mental health. The pt processed ways by which to relate to the speaker. MHA speaker provided handouts and educational information pertaining to groups and services offered by the MHA. Pt was engaged in speaker's presentation and was receptive to resources provided.    Adelyna Brockman C Vergene Marland, LCSW 09/04/2017 4:03 PM  

## 2017-09-04 NOTE — Progress Notes (Signed)
Patient ID: Veronica Waters, female   DOB: July 20, 1991, 26 y.o.   MRN: [id number]   MD Cobos notified during treatment team of patient's elevated pulse.

## 2017-09-04 NOTE — Progress Notes (Signed)
Patient ID: Veronica Waters, female   DOB: 05-28-1991, 26 y.o.   MRN: [id number]  EKG performed and placed on patient's chart.

## 2017-09-04 NOTE — Progress Notes (Cosign Needed)
Adult Psychoeducational Group Note  Date:  09/04/2017 Time:  4:45 AM  Group Topic/Focus:  Wrap-Up Group:   The focus of this group is to help patients review their daily goal of treatment and discuss progress on daily workbooks.  Participation Level:  Active  Participation Quality:  Appropriate  Affect:  Appropriate  Cognitive:  Appropriate  Insight: Appropriate  Engagement in Group:  Engaged  Modes of Intervention:  Discussion  Additional Comments:  Pt stated her goal for today was to talk with doctor about discharge plan. Pt stated she was able to accomplished her goal today. Pt rated her day a 4 out of 10.  Felipa FurnaceChristopher  Amandajo Gonder 09/04/2017, 4:45 AM

## 2017-09-04 NOTE — Progress Notes (Signed)
Recreation Therapy Notes  Date: 09/04/17 Time: 0930 Location: [address] Dayroom  Group Topic: Stress Management  Goal Area(s) Addresses:  Patient will verbalize importance of using healthy stress management.  Patient will identify positive emotions associated with healthy stress management.   Intervention: Stress Management  Activity :  Guided Imagery.  LRT introduced the stress management technique of guided imagery.  LRT read a script to allow patients to participate in the activity.  Patients were to follow along as LRT read the script to engage in the activity.  Education:  Stress Management, Discharge Planning.   Education Outcome: Acknowledges edcuation/In group clarification offered/Needs additional education  Clinical Observations/Feedback: Pt did not attend group.   Caroll RancherMarjette Gracianna Vink, LRT/CTRS         Caroll RancherLindsay, Vincie Linn A 09/04/2017 12:14 PM

## 2017-09-04 NOTE — Progress Notes (Signed)
Pt complained of pain from headache of 9/10.  Allergy to Aspirin and pt overdosed on acetaminophen prior to admission; on-site provider notified and acetaminophen 650 mg POX1 ordered and administered.  Additional lab orders placed by provider for tomorrow AM.  On-site provider notified of pt blood pressure of 110/46.  No new orders at this time.  Pt encouraged to rest.  PO fluids encouraged and provided.  Fall prevention techniques reviewed with pt, pt verbalized understanding.  Pt denies needs and concerns at this time.  Will continue to monitor and assess.

## 2017-09-04 NOTE — Progress Notes (Signed)
Patient ID: Veronica Waters, female   DOB: January 22, 1991, 26 y.o.   MRN: [id number]  DAR: Pt. Denies SI/HI and A/V Hallucinations. She reports sleep is good, appetite is "sucky" this morning however reported good on her self inventory sheet today. She reports her energy level is normal and her concentration is good. She rates depression 7/10, hopelessness 5/10, and anxiety 6/10. Patient does not report any pain or discomfort at this time. Support and encouragement provided to the patient. Scheduled medications administered to patient per physician's orders. She reports anxiety and received PRN Klonopin. Her pulse is elevated and MD Cobos was made aware. Patient is minimal and guarded at this time. She is seen in the milieu intermittently but appears to be keeping to herself. Q15 minute checks are maintained for safety.

## 2017-09-05 MED ORDER — DULOXETINE HCL 40 MG PO CPEP: 40.0000 mg | ORAL_CAPSULE | Freq: Every day | ORAL | 0 refills | Status: DC

## 2017-09-05 MED ORDER — ALBUTEROL SULFATE HFA 108 (90 BASE) MCG/ACT IN AERS: 1.0000 | INHALATION_SPRAY | Freq: Four times a day (QID) | RESPIRATORY_TRACT | 0 refills | Status: DC | PRN

## 2017-09-05 MED ORDER — HYDROCHLOROTHIAZIDE 12.5 MG PO CAPS: 12.5000 mg | ORAL_CAPSULE | Freq: Every day | ORAL | 0 refills | Status: DC

## 2017-09-05 MED ORDER — LISINOPRIL 20 MG PO TABS: 20.0000 mg | ORAL_TABLET | Freq: Every day | ORAL | 0 refills | Status: DC

## 2017-09-05 MED ORDER — HYDROXYZINE HCL 50 MG PO TABS
50.0000 mg | ORAL_TABLET | Freq: Once | ORAL | Status: AC
  Administered 2017-09-05: 50 mg via ORAL
  Filled 2017-09-05 (×2): qty 1

## 2017-09-05 MED ORDER — PROPRANOLOL HCL 20 MG PO TABS: 20.0000 mg | ORAL_TABLET | Freq: Two times a day (BID) | ORAL | 0 refills | Status: DC

## 2017-09-05 MED ORDER — LURASIDONE HCL 20 MG PO TABS: 20.0000 mg | ORAL_TABLET | Freq: Every day | ORAL | 0 refills | Status: DC

## 2017-09-05 MED ORDER — PROPRANOLOL HCL 10 MG PO TABS: 10.0000 mg | ORAL_TABLET | Freq: Two times a day (BID) | ORAL | 0 refills | Status: DC

## 2017-09-05 MED ORDER — PROPRANOLOL HCL 10 MG PO TABS
10.0000 mg | ORAL_TABLET | Freq: Two times a day (BID) | ORAL | Status: DC
  Filled 2017-09-05 (×2): qty 14
  Filled 2017-09-05 (×2): qty 1

## 2017-09-05 MED ORDER — PRAZOSIN HCL 1 MG PO CAPS: 1.0000 mg | ORAL_CAPSULE | Freq: Every day | ORAL | 0 refills | Status: DC

## 2017-09-05 MED ORDER — GABAPENTIN 300 MG PO CAPS: 600.0000 mg | ORAL_CAPSULE | Freq: Three times a day (TID) | ORAL | 0 refills | Status: DC

## 2017-09-05 NOTE — Progress Notes (Signed)
  [facility] :  Will you be returning to the same living situation after discharge:  Yes,  Pt returning home At discharge, do you have transportation home?: Yes,  Pt mother to pick up Do you have the ability to pay for your medications: Yes,  Pt provided with samples and prescriptions  Release of information consent forms completed and in the chart;  Patient's signature needed at discharge.  Patient to Follow up at: Follow-up Information    [facility] Follow up on 09/09/2017.   Why:  at 9:20am for your assessment with the psychiatrist. You may resume group on Friday 9/14.  Contact information: [address],  [place], [place] [phone] Fax: [phone]       [facility] Follow up on 09/09/2017.   Specialty:  Behavioral Health Why:  at 8:00am for your hospital discharge appointment. Please plan to stay for a few hours to complete this process.  Contact information: [address] [place] [address] [phone]           Next level of care provider [facility] Link:no  Safety Planning and Suicide Prevention discussed: Yes,  with Pt; declined family contact  Have you used any form of tobacco in the last 30 days? (Cigarettes, Smokeless Tobacco, Cigars, and/or Pipes): No  Has patient been referred to the Quitline?: N/A patient is not a smoker  Patient has been referred for addiction treatment: Yes  Verdene LennertLauren C Tatum Corl, LCSW 09/05/2017, 10:29 AM

## 2017-09-05 NOTE — [facility] Suicide Risk Assessment (Addendum)
[facility]   Principal Problem: MDD (major depressive disorder), recurrent severe, without psychosis (HCC) Discharge Diagnoses:  Patient Active Problem List   Diagnosis Date Noted  . MDD (major depressive disorder), recurrent severe, without psychosis (HCC) [F33.2] 09/01/2017    Total Time spent with patient: 30 minutes  Musculoskeletal: Strength & Muscle Tone: within normal limits Gait & Station: normal Patient leans: N/A  Psychiatric Specialty Exam: ROS denies headache, no chest pain, no shortness of breath, no vomiting   Blood pressure 118/82, pulse 96, temperature 98.2 F (36.8 C), temperature source Oral, resp. rate 16, height 5\' 7"  (1.702 m), weight 131.5 kg (290 lb), SpO2 98 %.Body mass index is 45.42 kg/m.  General Appearance: improved grooming   Eye Contact::  Good  Speech:  Normal Rate409  Volume:  Decreased  Mood:  reports her mood is improved compared to her admission  Affect:  less constricted   Thought Process:  Linear and Descriptions of Associations: Intact  Orientation:  Other:  fully alert and attentive   Thought Content:  denies hallucinations, no delusions, not internally preoccupied   Suicidal Thoughts:  No denies suicidal or self injurious ideations, denies homicidal ideations  Homicidal Thoughts:  No  Memory:  recent and remote grossly intact   Judgement:  Other:  improving   Insight:  improving   Psychomotor Activity:  Decreased  Concentration:  Good  Recall:  Good  Fund of Knowledge:Good  Language: Good  Akathisia:  Negative  Handed:  Right  AIMS (if indicated):     Assets:  Communication Skills Desire for Improvement Resilience  Sleep:  Number of Hours: 5.25  Cognition: WNL  ADL's:  Intact   Mental Status Per Nursing Assessment::   On Admission:     Demographic Factors:  26 year old divorced female, employed, no children   Loss Factors: Recent finalized divorce   Historical Factors: History of prior  psychiatric admissions, history of depression, history of PTSD symptoms History of substance abuse, sober x 2 years   Risk Reduction Factors:   Employed and Positive coping skills or problem solving skills  Continued Clinical Symptoms:  Patient reports she is feeling better, less depressed, describes mood as 6/10,  and her affect presents more reactive. No thought disorder, no suicidal or self injurious ideations, no homicidal or violent ideations, no hallucinations, no delusions . Future oriented .  Denies medication side effects- but felt slightly dizzy after Inderal dose this AM. Will decrease dose to 10 mgrs BID- it has been effective in decreasing symptoms of anxiety and tachycardia.  Side effects reviewed  Behavior on unit calm and appropriate, polite and pleasant on approach.   Cognitive Features That Contribute To Risk:  No gross cognitive deficits noted upon discharge. Is alert , attentive, and oriented x 3   Suicide Risk:  Mild:  Suicidal ideation of limited frequency, intensity, duration, and specificity.  There are no identifiable plans, no associated intent, mild dysphoria and related symptoms, good self-control (both objective and subjective assessment), few other risk factors, and identifiable protective factors, including available and accessible social support.  Follow-up Information    [facility] Follow up on 09/09/2017.   Why:  at 9:20am for your assessment with the psychiatrist. You may resume group on Friday 9/14.  Contact information: [address],  [place], [place] [phone] Fax: [phone]       [facility] Follow up on 09/09/2017.   Specialty:  Behavioral Health Why:  at 8:00am  for your hospital discharge appointment. Please plan to stay for a few hours to complete this process.  Contact information: [address] [place] [address] [phone]           Plan Of Care/Follow-up recommendations:  Activity:   as tolerated  Diet:  Heart Healthy Tests:  NA Other:  See below  Expresses readiness for discharge, leaving unit in good spirits  Plans to return home- states mother is going to pick her up later today Plans to follow up as above    Craige Cotta, MD 09/05/2017, 10:43 AM

## 2017-09-05 NOTE — Progress Notes (Signed)
Discharge D- Patient verbalizes readiness for discharge: Denies SI/HI/AVH and pain.   A- Discharge instructions read and discussed with patient.  All belongings returned to patient to include cell phone, shorts, charger, and bracelet. R- Patient cooperative with discharge process.  Patient verbalizes understanding of discharge instructions.  Signed for return of belongings. Escorted to the lobby.
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
## 2018-01-05 ENCOUNTER — Other Ambulatory Visit: Payer: Self-pay

## 2018-01-05 ENCOUNTER — Inpatient Hospital Stay (HOSPITAL_COMMUNITY)
Admission: RE | Admit: 2018-01-05 | Discharge: 2018-01-14 | DRG: 885 | Disposition: A | Discharge status: Home / Self Care | Payer: Federal, State, Local not specified - Other | Attending: Psychiatry | Admitting: Psychiatry

## 2018-01-05 ENCOUNTER — Encounter (HOSPITAL_COMMUNITY): Payer: Self-pay

## 2018-01-05 DIAGNOSIS — F419 Anxiety disorder, unspecified: Secondary | ICD-10-CM | POA: Diagnosis not present

## 2018-01-05 DIAGNOSIS — F332 Major depressive disorder, recurrent severe without psychotic features: Principal | ICD-10-CM | POA: Diagnosis present

## 2018-01-05 DIAGNOSIS — Z818 Family history of other mental and behavioral disorders: Secondary | ICD-10-CM | POA: Diagnosis not present

## 2018-01-05 DIAGNOSIS — E663 Overweight: Secondary | ICD-10-CM | POA: Diagnosis present

## 2018-01-05 DIAGNOSIS — Z638 Other specified problems related to primary support group: Secondary | ICD-10-CM | POA: Diagnosis not present

## 2018-01-05 DIAGNOSIS — I10 Essential (primary) hypertension: Secondary | ICD-10-CM | POA: Diagnosis present

## 2018-01-05 DIAGNOSIS — Z23 Encounter for immunization: Secondary | ICD-10-CM

## 2018-01-05 DIAGNOSIS — R45851 Suicidal ideations: Secondary | ICD-10-CM | POA: Diagnosis not present

## 2018-01-05 DIAGNOSIS — Z6841 Body Mass Index (BMI) 40.0 and over, adult: Secondary | ICD-10-CM | POA: Diagnosis not present

## 2018-01-05 DIAGNOSIS — Z813 Family history of other psychoactive substance abuse and dependence: Secondary | ICD-10-CM | POA: Diagnosis not present

## 2018-01-05 DIAGNOSIS — Z915 Personal history of self-harm: Secondary | ICD-10-CM

## 2018-01-05 DIAGNOSIS — Z630 Problems in relationship with spouse or partner: Secondary | ICD-10-CM | POA: Diagnosis not present

## 2018-01-05 DIAGNOSIS — Z91410 Personal history of adult physical and sexual abuse: Secondary | ICD-10-CM | POA: Diagnosis not present

## 2018-01-05 DIAGNOSIS — Z599 Problem related to housing and economic circumstances, unspecified: Secondary | ICD-10-CM | POA: Diagnosis not present

## 2018-01-05 DIAGNOSIS — Z634 Disappearance and death of family member: Secondary | ICD-10-CM | POA: Diagnosis not present

## 2018-01-05 MED ORDER — PRAZOSIN HCL 1 MG PO CAPS
1.0000 mg | ORAL_CAPSULE | Freq: Every day | ORAL | Status: DC
  Administered 2018-01-05: 1 mg via ORAL
  Filled 2018-01-05 (×3): qty 1

## 2018-01-05 MED ORDER — INFLUENZA VAC SPLIT QUAD 0.5 ML IM SUSY
0.5000 mL | PREFILLED_SYRINGE | INTRAMUSCULAR | Status: AC
  Administered 2018-01-08: 0.5 mL via INTRAMUSCULAR
  Filled 2018-01-05: qty 0.5

## 2018-01-05 MED ORDER — HYDROCHLOROTHIAZIDE 12.5 MG PO CAPS
12.5000 mg | ORAL_CAPSULE | Freq: Every day | ORAL | Status: DC
  Administered 2018-01-06 – 2018-01-14 (×9): 12.5 mg via ORAL
  Filled 2018-01-05 (×9): qty 1
  Filled 2018-01-05: qty 7
  Filled 2018-01-05: qty 1

## 2018-01-05 MED ORDER — LURASIDONE HCL 20 MG PO TABS
20.0000 mg | ORAL_TABLET | Freq: Every day | ORAL | Status: DC
  Filled 2018-01-05: qty 1

## 2018-01-05 MED ORDER — PROPRANOLOL HCL 10 MG PO TABS
10.0000 mg | ORAL_TABLET | Freq: Two times a day (BID) | ORAL | Status: DC
  Administered 2018-01-05 – 2018-01-14 (×18): 10 mg via ORAL
  Filled 2018-01-05 (×4): qty 1
  Filled 2018-01-05: qty 14
  Filled 2018-01-05 (×9): qty 1
  Filled 2018-01-05: qty 14
  Filled 2018-01-05 (×9): qty 1

## 2018-01-05 MED ORDER — MAGNESIUM HYDROXIDE 400 MG/5ML PO SUSP: 30.0000 mL | Freq: Every day | ORAL | Status: DC | PRN

## 2018-01-05 MED ORDER — CLONIDINE HCL 0.1 MG PO TABS
0.1000 mg | ORAL_TABLET | Freq: Once | ORAL | Status: AC
  Administered 2018-01-05: 0.1 mg via ORAL
  Filled 2018-01-05 (×2): qty 1

## 2018-01-05 MED ORDER — DULOXETINE HCL 20 MG PO CPEP
20.0000 mg | ORAL_CAPSULE | Freq: Every day | ORAL | Status: DC
  Administered 2018-01-06: 20 mg via ORAL
  Filled 2018-01-05 (×2): qty 1

## 2018-01-05 MED ORDER — ALUM & MAG HYDROXIDE-SIMETH 200-200-20 MG/5ML PO SUSP: 30.0000 mL | ORAL | Status: DC | PRN

## 2018-01-05 MED ORDER — QUETIAPINE FUMARATE 50 MG PO TABS
50.0000 mg | ORAL_TABLET | Freq: Every day | ORAL | Status: DC
  Administered 2018-01-05: 50 mg via ORAL
  Filled 2018-01-05 (×3): qty 1

## 2018-01-05 MED ORDER — LISINOPRIL 20 MG PO TABS
20.0000 mg | ORAL_TABLET | Freq: Every day | ORAL | Status: DC
  Administered 2018-01-06 – 2018-01-14 (×9): 20 mg via ORAL
  Filled 2018-01-05 (×2): qty 1
  Filled 2018-01-05: qty 7
  Filled 2018-01-05 (×8): qty 1

## 2018-01-05 MED ORDER — HYDROXYZINE HCL 25 MG PO TABS
25.0000 mg | ORAL_TABLET | Freq: Three times a day (TID) | ORAL | Status: DC | PRN
  Administered 2018-01-08 – 2018-01-13 (×3): 25 mg via ORAL
  Filled 2018-01-05 (×2): qty 1
  Filled 2018-01-05: qty 10
  Filled 2018-01-05 (×2): qty 1

## 2018-01-05 MED ORDER — GABAPENTIN 300 MG PO CAPS
600.0000 mg | ORAL_CAPSULE | Freq: Three times a day (TID) | ORAL | Status: DC
  Administered 2018-01-06: 600 mg via ORAL
  Filled 2018-01-05 (×4): qty 2

## 2018-01-05 MED ORDER — ALBUTEROL SULFATE HFA 108 (90 BASE) MCG/ACT IN AERS: 1.0000 | INHALATION_SPRAY | Freq: Four times a day (QID) | RESPIRATORY_TRACT | Status: DC | PRN

## 2018-01-05 NOTE — H&P (Signed)
Behavioral Health Medical Screening Exam  Veronica Waters is an 27 y.o. female.  Total Time spent with patient: 15 minutes  Psychiatric Specialty Exam: Physical Exam  Constitutional: She is oriented to person, place, and time. She appears well-developed and well-nourished. No distress.  HENT:  Head: Normocephalic and atraumatic.  Right Ear: External ear normal.  Left Ear: External ear normal.  Cardiovascular: Regular rhythm and normal heart sounds.  Respiratory: Effort normal and breath sounds normal. No respiratory distress.  Neurological: She is alert and oriented to person, place, and time.  Skin: Skin is warm and dry. She is not diaphoretic.  Psychiatric: Her speech is normal. Her mood appears anxious. Her affect is blunt. She is not withdrawn and not actively hallucinating. Thought content is not paranoid and not delusional. Cognition and memory are normal. She expresses impulsivity and inappropriate judgment. She exhibits a depressed mood. She expresses suicidal ideation. She expresses no homicidal ideation. She expresses no suicidal plans.    Review of Systems  Constitutional: Negative for chills and fever.  Respiratory: Negative for cough, shortness of breath and wheezing.   Cardiovascular: Negative for chest pain and palpitations.  Neurological: Negative for dizziness.  Psychiatric/Behavioral: Positive for depression and suicidal ideas. Negative for hallucinations, memory loss and substance abuse. The patient is nervous/anxious and has insomnia.   All other systems reviewed and are negative.   Blood pressure (!) 181/106, pulse (!) 114, temperature 98.6 F (37 C), temperature source Oral, resp. rate (!) 26, height 5' 6.75" (1.695 m), weight (!) 142.9 kg (315 lb), SpO2 100 %.Body mass index is 49.71 kg/m.  General Appearance: Casual and Fairly Groomed  Eye Contact:  Fair  Speech:  Clear and Coherent and Normal Rate  Volume:  Normal  Mood:  Anxious, Depressed, Hopeless, Irritable  and Worthless  Affect:  Blunt  Thought Process:  Coherent and Descriptions of Associations: Intact  Orientation:  Full (Time, Place, and Person)  Thought Content:  Logical and Hallucinations: None  Suicidal Thoughts:  Yes.  without intent/plan  Homicidal Thoughts:  No  Memory:  Immediate;   Fair Recent;   Fair  Judgement:  Fair  Insight:  Fair  Psychomotor Activity:  Normal  Concentration: Concentration: Fair and Attention Span: Fair  Recall:  [employer]Fair  Fund of Knowledge:Fair  Language: Good  Akathisia:  No  Handed:  Right  AIMS (if indicated):     Assets:  Communication Skills Desire for Improvement Financial Resources/Insurance Housing Leisure Time Transportation  Sleep:       Musculoskeletal: Strength & Muscle Tone: within normal limits Gait & Station: normal   Blood pressure (!) 181/106, pulse (!) 114, temperature 98.6 F (37 C), temperature source Oral, resp. rate (!) 26, height 5' 6.75" (1.695 m), weight (!) 142.9 kg (315 lb), SpO2 100 %.  Recommendations:  Based on my evaluation the patient does not appear to have an emergency medical condition.  Jackelyn PolingJason A Xochilt Conant, NP 01/05/2018, 11:01 PM
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
## 2018-01-05 NOTE — Progress Notes (Signed)
Patient presents with sarcastic/superficial/anxious/apprehensive affect and behavior during admission interview and assessment. VS monitored and recorded. VS reported to provider. Skin check performed with Mardella LaymanLindsey MHT and revealed multiple tattoos. Contraband was not found. Patient was oriented to unit and schedule. Pt states "I stopped taking my meds about three weeks ago because I believe I didn't need them anymore. Then, I started getting depressed and not eating. I got in a fight with my mom and stated I wanted to die. I realized I just needed to come on in before I did anything rash.". Pt denies SI/HI/AVH at this time. PO fluids provided. Safety maintained. Rest encouraged.

## 2018-01-05 NOTE — Tx Team (Signed)
Initial Treatment Plan 01/05/2018 10:45 PM Veronica Waters ZOX:[id number]    PATIENT STRESSORS: Health problems Marital or family conflict Medication change or noncompliance   PATIENT STRENGTHS: Ability for insight Active sense of humor Average or above average intelligence   PATIENT IDENTIFIED PROBLEMS: "I want to get back on my meds"  "I want to attend mental health appointments close to home"                   DISCHARGE CRITERIA:  Ability to meet basic life and health needs Adequate post-discharge living arrangements Improved stabilization in mood, thinking, and/or behavior  PRELIMINARY DISCHARGE PLAN: Participate in family therapy Return to previous living arrangement Return to previous work or school arrangements  PATIENT/FAMILY INVOLVEMENT: This treatment plan has been presented to and reviewed with the patient, Veronica Napoleonrica Ricklefs.  The patient and family have been given the opportunity to ask questions and make suggestions.  Jonetta SpeakAshton E Shelita Steptoe, RN 01/05/2018, 10:45 PM

## 2018-01-06 DIAGNOSIS — F4010 Social phobia, unspecified: Secondary | ICD-10-CM

## 2018-01-06 DIAGNOSIS — F419 Anxiety disorder, unspecified: Secondary | ICD-10-CM

## 2018-01-06 DIAGNOSIS — R45851 Suicidal ideations: Secondary | ICD-10-CM

## 2018-01-06 DIAGNOSIS — Z818 Family history of other mental and behavioral disorders: Secondary | ICD-10-CM

## 2018-01-06 DIAGNOSIS — F410 Panic disorder [episodic paroxysmal anxiety] without agoraphobia: Secondary | ICD-10-CM

## 2018-01-06 DIAGNOSIS — Z813 Family history of other psychoactive substance abuse and dependence: Secondary | ICD-10-CM

## 2018-01-06 DIAGNOSIS — F332 Major depressive disorder, recurrent severe without psychotic features: Principal | ICD-10-CM

## 2018-01-06 DIAGNOSIS — Z91410 Personal history of adult physical and sexual abuse: Secondary | ICD-10-CM

## 2018-01-06 LAB — COMPREHENSIVE METABOLIC PANEL
ALK PHOS: 78 U/L (ref 38–126)
ALT: 27 U/L (ref 14–54)
ANION GAP: 7 (ref 5–15)
AST: 18 U/L (ref 15–41)
Albumin: 3.5 g/dL (ref 3.5–5.0)
BILIRUBIN TOTAL: 0.8 mg/dL (ref 0.3–1.2)
BUN: 11 mg/dL (ref 6–20)
CALCIUM: 9.3 mg/dL (ref 8.9–10.3)
CO2: 24 mmol/L (ref 22–32)
Chloride: 109 mmol/L (ref 101–111)
Creatinine, Ser: 0.74 mg/dL (ref 0.44–1.00)
GLUCOSE: 98 mg/dL (ref 65–99)
Potassium: 4 mmol/L (ref 3.5–5.1)
Sodium: 140 mmol/L (ref 135–145)
TOTAL PROTEIN: 8 g/dL (ref 6.5–8.1)

## 2018-01-06 LAB — CBC
HEMATOCRIT: 38.5 % (ref 36.0–46.0)
HEMOGLOBIN: 12 g/dL (ref 12.0–15.0)
MCH: 25.8 pg — AB (ref 26.0–34.0)
MCHC: 31.2 g/dL (ref 30.0–36.0)
MCV: 82.8 fL (ref 78.0–100.0)
Platelets: 379 10*3/uL (ref 150–400)
RBC: 4.65 MIL/uL (ref 3.87–5.11)
RDW: 14.5 % (ref 11.5–15.5)
WBC: 9.4 10*3/uL (ref 4.0–10.5)

## 2018-01-06 LAB — PREGNANCY, URINE: PREG TEST UR: NEGATIVE

## 2018-01-06 LAB — RAPID URINE DRUG SCREEN, HOSP PERFORMED
AMPHETAMINES: NOT DETECTED
Barbiturates: NOT DETECTED
Benzodiazepines: NOT DETECTED
COCAINE: NOT DETECTED
OPIATES: NOT DETECTED
Tetrahydrocannabinol: NOT DETECTED

## 2018-01-06 LAB — TSH: TSH: 5.934 u[IU]/mL — ABNORMAL HIGH (ref 0.350–4.500)

## 2018-01-06 LAB — LIPID PANEL
CHOLESTEROL: 218 mg/dL — AB (ref 0–200)
HDL: 33 mg/dL — ABNORMAL LOW (ref >40)
LDL Cholesterol: 158 mg/dL — ABNORMAL HIGH (ref 0–99)
Total CHOL/HDL Ratio: 6.6 RATIO
Triglycerides: 134 mg/dL (ref <150)
VLDL: 27 mg/dL (ref 0–40)

## 2018-01-06 LAB — HEMOGLOBIN A1C
Hgb A1c MFr Bld: 5.4 % (ref 4.8–5.6)
MEAN PLASMA GLUCOSE: 108.28 mg/dL

## 2018-01-06 MED ORDER — FLUOXETINE HCL 20 MG PO CAPS
20.0000 mg | ORAL_CAPSULE | Freq: Every day | ORAL | Status: DC
  Administered 2018-01-06 – 2018-01-08 (×3): 20 mg via ORAL
  Filled 2018-01-06 (×5): qty 1

## 2018-01-06 MED ORDER — ZIPRASIDONE HCL 20 MG PO CAPS
20.0000 mg | ORAL_CAPSULE | Freq: Two times a day (BID) | ORAL | Status: DC
  Administered 2018-01-06 – 2018-01-14 (×16): 20 mg via ORAL
  Filled 2018-01-06 (×14): qty 1
  Filled 2018-01-06: qty 14
  Filled 2018-01-06 (×2): qty 1
  Filled 2018-01-06: qty 14
  Filled 2018-01-06 (×2): qty 1

## 2018-01-06 MED ORDER — GABAPENTIN 100 MG PO CAPS
200.0000 mg | ORAL_CAPSULE | Freq: Three times a day (TID) | ORAL | Status: DC
  Administered 2018-01-06 – 2018-01-08 (×6): 200 mg via ORAL
  Filled 2018-01-06 (×11): qty 2

## 2018-01-06 MED ORDER — AMITRIPTYLINE HCL 25 MG PO TABS
50.0000 mg | ORAL_TABLET | Freq: Every evening | ORAL | Status: DC | PRN
  Administered 2018-01-06 – 2018-01-13 (×8): 50 mg via ORAL
  Filled 2018-01-06 (×6): qty 2
  Filled 2018-01-06: qty 7
  Filled 2018-01-06 (×2): qty 2

## 2018-01-06 MED ORDER — CLONAZEPAM 0.5 MG PO TABS
0.5000 mg | ORAL_TABLET | Freq: Two times a day (BID) | ORAL | Status: DC | PRN
  Administered 2018-01-06 – 2018-01-11 (×10): 0.5 mg via ORAL
  Filled 2018-01-06 (×11): qty 1

## 2018-01-06 NOTE — Tx Team (Signed)
Interdisciplinary Treatment and Diagnostic Plan Update  01/06/2018 Time of Session: [place] MRN: [id number]  Principal Diagnosis: Severe recurrent major depression without psychotic features [facility])  Secondary Diagnoses: Principal Problem:   Severe recurrent major depression without psychotic features ([place])   Current Medications:  Current Facility-Administered Medications  Medication Dose Route Frequency Provider Last Rate Last Dose  . albuterol (PROVENTIL HFA;VENTOLIN HFA) 108 (90 Base) MCG/ACT inhaler 1-2 puff  1-2 puff Inhalation Q6H PRN Lindon Romp A, NP      . alum & mag hydroxide-simeth (MAALOX/MYLANTA) 200-200-20 MG/5ML suspension 30 mL  30 mL Oral Q4H PRN Lindon Romp A, NP      . clonazePAM Bobbye [place]) tablet 0.5 mg  0.5 mg Oral BID PRN Money, Lowry Ram, FNP   0.5 mg at 01/06/18 1217  . FLUoxetine (PROZAC) capsule 20 mg  20 mg Oral Daily Money, Lowry Ram, FNP   20 mg at 01/06/18 1217  . gabapentin (NEURONTIN) capsule 200 mg  200 mg Oral TID Cobos, Fernando A, MD      . hydrochlorothiazide (MICROZIDE) capsule 12.5 mg  12.5 mg Oral Daily Lindon Romp A, NP   12.5 mg at 01/06/18 7741  . hydrOXYzine (ATARAX/VISTARIL) tablet 25 mg  25 mg Oral TID PRN Rozetta Nunnery, NP      . Influenza vac split quadrivalent PF (FLUARIX) injection 0.5 mL  0.5 mL Intramuscular Tomorrow-1000 Lindon Romp A, NP      . lisinopril (PRINIVIL,ZESTRIL) tablet 20 mg  20 mg Oral Daily Lindon Romp A, NP   20 mg at 01/06/18 0821  . magnesium hydroxide (MILK OF MAGNESIA) suspension 30 mL  30 mL Oral Daily PRN Lindon Romp A, NP      . propranolol (INDERAL) tablet 10 mg  10 mg Oral BID Lindon Romp A, NP   10 mg at 01/06/18 0821  . ziprasidone (GEODON) capsule 20 mg  20 mg Oral BID WC Money, Lowry Ram, FNP       PTA Medications: Medications Prior to Admission  Medication Sig Dispense Refill Last Dose  . albuterol (PROVENTIL HFA;VENTOLIN HFA) 108 (90 Base) MCG/ACT inhaler Inhale 1-2 puffs into the lungs every  6 (six) hours as needed for wheezing or shortness of breath. 1 Inhaler 0   . clonazePAM (KLONOPIN) 0.5 MG tablet Take 0.5 mg by mouth 3 (three) times daily as needed for anxiety.   08/31/2017 at Unknown time  . DULoxetine 40 MG CPEP Take 40 mg by mouth daily. For mood control 30 capsule 0   . gabapentin (NEURONTIN) 300 MG capsule Take 2 capsules (600 mg total) by mouth 3 (three) times daily. 180 capsule 0   . hydrochlorothiazide (MICROZIDE) 12.5 MG capsule Take 1 capsule (12.5 mg total) by mouth daily. 30 capsule 0   . lisinopril (PRINIVIL,ZESTRIL) 20 MG tablet Take 1 tablet (20 mg total) by mouth daily. For high blood pressure 30 tablet 0   . lurasidone (LATUDA) 20 MG TABS tablet Take 1 tablet (20 mg total) by mouth daily with supper. For mood control 30 tablet 0   . prazosin (MINIPRESS) 1 MG capsule Take 1 capsule (1 mg total) by mouth at bedtime. PTSD symptoms 30 capsule 0   . propranolol (INDERAL) 10 MG tablet Take 1 tablet (10 mg total) by mouth 2 (two) times daily. 60 tablet 0     Patient Stressors: Health problems Marital or family conflict Medication change or noncompliance  Patient Strengths: Ability for insight Active sense of humor Average or above  average intelligence  Treatment Modalities: Medication Management, Group therapy, Case management,  1 to 1 session with clinician, Psychoeducation, Recreational therapy.   Physician Treatment Plan for Primary Diagnosis: Severe recurrent major depression without psychotic features ([place]) Long Term Goal(s): Improvement in symptoms so as ready for discharge Improvement in symptoms so as ready for discharge   Short Term Goals: Ability to demonstrate self-control will improve Compliance with prescribed medications will improve Ability to verbalize feelings will improve Ability to disclose and discuss suicidal ideas  Medication Management: Evaluate patient's response, side effects, and tolerance of medication regimen.  Therapeutic  Interventions: 1 to 1 sessions, Unit Group sessions and Medication administration.  Evaluation of Outcomes: Not Met  Physician Treatment Plan for Secondary Diagnosis: Principal Problem:   Severe recurrent major depression without psychotic features ([place])  Long Term Goal(s): Improvement in symptoms so as ready for discharge Improvement in symptoms so as ready for discharge   Short Term Goals: Ability to demonstrate self-control will improve Compliance with prescribed medications will improve Ability to verbalize feelings will improve Ability to disclose and discuss suicidal ideas     Medication Management: Evaluate patient's response, side effects, and tolerance of medication regimen.  Therapeutic Interventions: 1 to 1 sessions, Unit Group sessions and Medication administration.  Evaluation of Outcomes: Not Met   RN Treatment Plan for Primary Diagnosis: Severe recurrent major depression without psychotic features ([place]) Long Term Goal(s): Knowledge of disease and therapeutic regimen to maintain health will improve  Short Term Goals: Ability to identify and develop effective coping behaviors will improve and Compliance with prescribed medications will improve  Medication Management: RN will administer medications as ordered by provider, will assess and evaluate patient's response and provide education to patient for prescribed medication. RN will report any adverse and/or side effects to prescribing provider.  Therapeutic Interventions: 1 on 1 counseling sessions, Psychoeducation, Medication administration, Evaluate responses to treatment, Monitor vital signs and CBGs as ordered, Perform/monitor CIWA, COWS, AIMS and Fall Risk screenings as ordered, Perform wound care treatments as ordered.  Evaluation of Outcomes: Not Met   LCSW Treatment Plan for Primary Diagnosis: Severe recurrent major depression without psychotic features ([place]) Long Term Goal(s): Safe transition to appropriate  next level of care at discharge, Engage patient in therapeutic group addressing interpersonal concerns.  Short Term Goals: Engage patient in aftercare planning with referrals and resources, Identify triggers associated with mental health/substance abuse issues and Increase skills for wellness and recovery  Therapeutic Interventions: Assess for all discharge needs, 1 to 1 time with Social worker, Explore available resources and support systems, Assess for adequacy in community support network, Educate family and significant other(s) on suicide prevention, Complete Psychosocial Assessment, Interpersonal group therapy.  Evaluation of Outcomes: Not Met   Progress in Treatment: Attending groups: No. Participating in groups: No. Taking medication as prescribed: Yes. Toleration medication: Yes. Family/Significant other contact made: No, will contact:  when given permission Patient understands diagnosis: Yes. Discussing patient identified problems/goals with staff: Yes. Medical problems stabilized or resolved: Yes. Denies suicidal/homicidal ideation: Yes. Issues/concerns per patient self-inventory: No. Other: none  New problem(s) identified: No, Describe:  none  New Short Term/Long Term Goal(s):  Discharge Plan or Barriers:   Reason for Continuation of Hospitalization: Depression Medication stabilization  Estimated Length of Stay: 3-5 days.  Attendees: Patient: 01/06/2018   Physician: Dr Parke Poisson, MD 01/06/2018   Nursing: Grayland Ormond, RN 01/06/2018   RN Care Manager: 01/06/2018   Social Worker: Lurline Idol, LCSW 01/06/2018   Recreational Therapist:  01/06/2018   Other:  01/06/2018   Other:  01/06/2018   Other: 01/06/2018     Scribe for Treatment Team: Joanne Chars, [place] 01/06/2018 1:59 PM

## 2018-01-06 NOTE — Progress Notes (Signed)
Recreation Therapy Notes  Date: 01/06/18 Time: 0930 Location: [address] Dayroom  Group Topic: Stress Management  Goal Area(s) Addresses:  Patient will verbalize importance of using healthy stress management.  Patient will identify positive emotions associated with healthy stress management.   Intervention: Stress Management  Activity :  [place].  LRT introduced the stress management technique of guided imagery.  LRT read a script to allow patients to visualize being in a protected wildlife sanctuary.  Patients were to follow along as script was read to engage in activity.  Education:  Stress Management, Discharge Planning.   Education Outcome: Acknowledges edcuation/In group clarification offered/Needs additional education  Clinical Observations/Feedback: Pt did not attend group.     Jairus Tonne, LRT/CTRS         Maika Kaczmarek A 01/06/2018 12:46 PM 

## 2018-01-06 NOTE — Progress Notes (Signed)
Pt presents with a flat affect and depressed mood. Pt rates depression 10/10. Anxiety 10/10. Pt endorses suicidal thoughts with no plan. Pt denies intent and verbally contracts for safety. Pt reports difficulty sleeping last night after taking sleep medication. Treatment made aware of pt complaint. Medications reviewed and administered as ordered per MD. Verbal support provided. Pt encouraged to attend groups. 15 minute checks performed for safety.  Pt compliant with tx plan.
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
## 2018-01-06 NOTE — Progress Notes (Addendum)
Patient ID: Veronica Waters, female   DOB: [birth year], 27 y.o.   MRN: [id number]  Pt currently presents with an animated affect and  behavior. Pt affect is labile. Pt seen smiling, then tearful and then irritable. Pt behavior is impulsive and guarded. Pt reports ongoing poor sleep with current medication regimen. Requests to take a sleep aid but does not want Minipress.   Pt provided with medications per providers orders. Pt's labs and vitals were monitored throughout the night. Pt given a 1:1 about emotional and mental status. Pt supported and encouraged to express concerns and questions. Provider notified of patients requests. Pt educated on medications, given a patient handout.  Pt's safety ensured with 15 minute and environmental checks. Endorses SI, [facility]. Pt currently denies HI and A/V hallucinations. Pt verbally agrees to seek staff if SI worsens, HI or A/VH occurs and to consult with staff before acting on any harmful thoughts. Will continue POC.
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
## 2018-01-06 NOTE — [facility] Group Notes (Signed)
[facility] LCSW Group Therapy Note  Date/Time: 01/06/18, 1315  Type of Therapy and Topic:  Group Therapy:  Overcoming Obstacles  Participation Level: Did not attend   Description of Group:    In this group patients will be encouraged to explore what they see as obstacles to their own wellness and recovery. They will be guided to discuss their thoughts, feelings, and behaviors related to these obstacles. The group will process together ways to cope with barriers, with attention given to specific choices patients can make. Each patient will be challenged to identify changes they are motivated to make in order to overcome their obstacles. This group will be process-oriented, with patients participating in exploration of their own experiences as well as giving and receiving support and challenge from other group members.  Therapeutic Goals: 1. Patient will identify personal and current obstacles as they relate to admission. 2. Patient will identify barriers that currently interfere with their wellness or overcoming obstacles.  3. Patient will identify feelings, thought process and behaviors related to these barriers. 4. Patient will identify two changes they are willing to make to overcome these obstacles:    Summary of Patient Progress      Therapeutic Modalities:   Cognitive Behavioral Therapy Solution Focused Therapy Motivational Interviewing Relapse Prevention Therapy  Daleen SquibbGreg Miquan Tandon, LCSW
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
## 2018-01-07 DIAGNOSIS — G4700 Insomnia, unspecified: Secondary | ICD-10-CM

## 2018-01-07 DIAGNOSIS — Z811 Family history of alcohol abuse and dependence: Secondary | ICD-10-CM

## 2018-01-07 DIAGNOSIS — R450 Nervousness: Secondary | ICD-10-CM

## 2018-01-07 DIAGNOSIS — Z638 Other specified problems related to primary support group: Secondary | ICD-10-CM

## 2018-01-07 DIAGNOSIS — Z91411 Personal history of adult psychological abuse: Secondary | ICD-10-CM

## 2018-01-07 LAB — T4, FREE: Free T4: 0.74 ng/dL (ref 0.61–1.12)

## 2018-01-07 NOTE — [facility] Group Notes (Signed)
[facility] LCSW Group Therapy Note  Date/Time: 01/07/18, 1315  Type of Therapy/Topic:  Group Therapy:  Feelings about Diagnosis  Participation Level:  Did Not Attend   Mood:   Description of Group:    This group will allow patients to explore their thoughts and feelings about diagnoses they have received. Patients will be guided to explore their level of understanding and acceptance of these diagnoses. Facilitator will encourage patients to process their thoughts and feelings about the reactions of others to their diagnosis, and will guide patients in identifying ways to discuss their diagnosis with significant others in their lives. This group will be process-oriented, with patients participating in exploration of their own experiences as well as giving and receiving support and challenge from other group members.   Therapeutic Goals: 1. Patient will demonstrate understanding of diagnosis as evidence by identifying two or more symptoms of the disorder:  2. Patient will be able to express two feelings regarding the diagnosis 3. Patient will demonstrate ability to communicate their needs through discussion and/or role plays  Summary of Patient Progress:        Therapeutic Modalities:   Cognitive Behavioral Therapy Brief Therapy Feelings Identification   Greg Naol Ontiveros, LCSW 

## 2018-01-07 NOTE — Progress Notes (Signed)
D: Patient denies SI, HI or AVH. Patient presents as flat and minimal with no complaints.  Pt. States that she slept well with medication but has had a poor appetite, concentration and decreased energy.  Pt. States her goal for today is to "get back on my medication".  She is visualized on the phone and in the day room.    A: Patient given emotional support from RN. Patient encouraged to come to staff with concerns and/or questions. Patient's medication routine continued. Patient's orders and plan of care reviewed.   R: Patient remains appropriate and cooperative. Will continue to monitor patient q15 minutes for safety.

## 2018-01-07 NOTE — Progress Notes (Signed)
The focus of this group is to help patients review their daily goal of treatment and discuss progress on daily workbooks. Pt attended the evening group session and responded to all discussion prompts from the Writer. Pt shared that today was a generally bad day on the unit. "I tried not to isolate today, which I do a lot when I'm depressed. I left my room for groups but that was about it."  When asked for something positive from her day, Pt mentioned being able to talk with a trusted friend on the phone who was supportive of her.  Pt rated her day a 1 out of 10 and she appeared depressed in the dayroom. Pt verbalized having hope that medication would help her feel better.

## 2018-01-07 NOTE — Progress Notes (Signed)
[facility] MD Progress Note  01/07/2018 11:34 AM Veronica Waters  MRN:  [id number] Subjective:  Veronica Waters reports " I have been feeling well because I was off my medications for the past 3 weeks."  Objective: Veronica Waters is awake, alert and oriented. Seen resting in bed, with slight irritability.   Denies suicidal or homicidal ideation. States " I mainly struggle with depression. Patient was prescribed Prozac 47m, Elavil 50 mg  And Geodon 20 mg BID states taken and tolerating medications.  Reports she follows- up  [facility].  States her mood has been getting worst and needs to be started with her medications.  ESoleireports her depression is 10/10 today. States is has no appetite.   Denies auditory or visual hallucination and does not appear to be responding to internal stimuli.  Support, encouragement and reassurance was provided.   Principal Problem: Severe recurrent major depression without psychotic features ([place] Diagnosis:   Patient Active Problem List   Diagnosis Date Noted  . Severe recurrent major depression without psychotic features ([employer] [F33.2] 01/05/2018  . MDD (major depressive disorder), recurrent severe, without psychosis ([place] [F33.2] 09/01/2017   Total Time spent with patient: 20 minutes  Past Psychiatric History:   Past Medical History:  Past Medical History:  Diagnosis Date  . Hypertension   . Seizure ([facility]     Past Surgical History:  Procedure Laterality Date  . TUBAL LIGATION     Family History: History reviewed. No pertinent family history. Family Psychiatric  History:  Social History:  Social History   Substance and Sexual Activity  Alcohol Use No     Social History   Substance and Sexual Activity  Drug Use No    Social History   Socioeconomic History  . Marital status: Single    Spouse name: None  . Number of children: None  . Years of education: None  . Highest education level: None  Social Needs  . Financial resource strain: None  . Food insecurity -  worry: None  . Food insecurity - inability: None  . Transportation needs - medical: None  . Transportation needs - non-medical: None  Occupational History  . None  Tobacco Use  . Smoking status: Never Smoker  . Smokeless tobacco: Never Used  Substance and Sexual Activity  . Alcohol use: No  . Drug use: No  . Sexual activity: None  Other Topics Concern  . None  Social History Narrative  . None   Additional Social History:    Pain Medications: See MAR Prescriptions: See MAR Over the Counter: See MAR History of alcohol / drug use?: No history of alcohol / drug abuse(Pt denies. )                    Sleep: Fair  Appetite:  Fair  Current Medications: Current Facility-Administered Medications  Medication Dose Route Frequency Provider Last Rate Last Dose  . albuterol (PROVENTIL HFA;VENTOLIN HFA) 108 (90 Base) MCG/ACT inhaler 1-2 puff  1-2 puff Inhalation Q6H PRN BLindon RompA, NP      . alum & mag hydroxide-simeth (MAALOX/MYLANTA) 200-200-20 MG/5ML suspension 30 mL  30 mL Oral Q4H PRN BLindon RompA, NP      . amitriptyline (ELAVIL) tablet 50 mg  50 mg Oral QHS PRN SPatriciaann ClanE, PA-C   50 mg at 01/06/18 2130  . clonazePAM (KLONOPIN) tablet 0.5 mg  0.5 mg Oral BID PRN Money, TLowry Ram FNP   0.5 mg at 01/06/18 2131  . FLUoxetine (  PROZAC) capsule 20 mg  20 mg Oral Daily Money, Lowry Ram, FNP   20 mg at 01/07/18 0843  . gabapentin (NEURONTIN) capsule 200 mg  200 mg Oral TID Cobos, Myer Peer, MD   200 mg at 01/07/18 0843  . hydrochlorothiazide (MICROZIDE) capsule 12.5 mg  12.5 mg Oral Daily Lindon Romp A, NP   12.5 mg at 01/07/18 0843  . hydrOXYzine (ATARAX/VISTARIL) tablet 25 mg  25 mg Oral TID PRN Rozetta Nunnery, NP      . Influenza vac split quadrivalent PF (FLUARIX) injection 0.5 mL  0.5 mL Intramuscular Tomorrow-1000 Lindon Romp A, NP      . lisinopril (PRINIVIL,ZESTRIL) tablet 20 mg  20 mg Oral Daily Lindon Romp A, NP   20 mg at 01/07/18 0843  . magnesium hydroxide  (MILK OF MAGNESIA) suspension 30 mL  30 mL Oral Daily PRN Lindon Romp A, NP      . propranolol (INDERAL) tablet 10 mg  10 mg Oral BID Lindon Romp A, NP   10 mg at 01/07/18 0843  . ziprasidone (GEODON) capsule 20 mg  20 mg Oral BID WC Money, Lowry Ram, FNP   20 mg at 01/07/18 0768    Lab Results:  Results for orders placed or performed during the hospital encounter of 01/05/18 (from the past 48 hour(s))  CBC     Status: Abnormal   Collection Time: 01/06/18  6:20 AM  Result Value Ref Range   WBC 9.4 4.0 - 10.5 K/uL   RBC 4.65 3.87 - 5.11 MIL/uL   Hemoglobin 12.0 12.0 - 15.0 g/dL   HCT 38.5 36.0 - 46.0 %   MCV 82.8 78.0 - 100.0 fL   MCH 25.8 (L) 26.0 - 34.0 pg   MCHC 31.2 30.0 - 36.0 g/dL   RDW 14.5 11.5 - 15.5 %   Platelets 379 150 - 400 K/uL    Comment: Performed at [facility], [place] [address] [place], [place] [address]  Comprehensive metabolic panel     Status: None   Collection Time: 01/06/18  6:20 AM  Result Value Ref Range   Sodium 140 135 - 145 mmol/L   Potassium 4.0 3.5 - 5.1 mmol/L   Chloride 109 101 - 111 mmol/L   CO2 24 22 - 32 mmol/L   Glucose, Bld 98 65 - 99 mg/dL   BUN 11 6 - 20 mg/dL   Creatinine, Ser 0.74 0.44 - 1.00 mg/dL   Calcium 9.3 8.9 - 10.3 mg/dL   Total Protein 8.0 6.5 - 8.1 g/dL   Albumin 3.5 3.5 - 5.0 g/dL   AST 18 15 - 41 U/L   ALT 27 14 - 54 U/L   Alkaline Phosphatase 78 38 - 126 U/L   Total Bilirubin 0.8 0.3 - 1.2 mg/dL   GFR calc non Af Amer >60 >60 mL/min   GFR calc Af Amer >60 >60 mL/min    Comment: (NOTE) The eGFR has been calculated using the CKD EPI equation. This calculation has not been validated in all clinical situations. eGFR's persistently <60 mL/min signify possible Chronic Kidney Disease.    Anion gap 7 5 - 15    Comment: Performed at [facility], [place] [address], [place], [place] [address]  Hemoglobin A1c     Status: None   Collection Time: 01/06/18  6:20 AM  Result Value Ref Range    Hgb A1c MFr Bld 5.4 4.8 - 5.6 %    Comment: (NOTE) Pre diabetes:  5.7%-6.4% Diabetes:              >6.4% Glycemic control for   <7.0% adults with diabetes    Mean Plasma Glucose 108.28 mg/dL    Comment: Performed at [facility] [address] [place], [place] [address]  Lipid panel     Status: Abnormal   Collection Time: 01/06/18  6:20 AM  Result Value Ref Range   Cholesterol 218 (H) 0 - 200 mg/dL   Triglycerides 134 <150 mg/dL   HDL 33 (L) >40 mg/dL   Total CHOL/HDL Ratio 6.6 RATIO   VLDL 27 0 - 40 mg/dL   LDL Cholesterol 158 (H) 0 - 99 mg/dL    Comment:        Total Cholesterol/HDL:CHD Risk Coronary Heart Disease Risk Table                     Men   Women  1/2 Average Risk   3.4   3.3  Average Risk       5.0   4.4  2 X Average Risk   9.6   7.1  3 X Average Risk  23.4   11.0        Use the calculated Patient Ratio above and the CHD Risk Table to determine the patient's CHD Risk.        ATP III CLASSIFICATION (LDL):  <100     mg/dL   Optimal  100-129  mg/dL   Near or Above                    Optimal  130-159  mg/dL   Borderline  160-189  mg/dL   High  >190     mg/dL   Very High Performed at [facility] [address], [place], [place] [address]   TSH     Status: Abnormal   Collection Time: 01/06/18  6:20 AM  Result Value Ref Range   TSH 5.934 (H) 0.350 - 4.500 uIU/mL    Comment: Performed by a 3rd Generation assay with a functional sensitivity of <=0.01 uIU/mL. Performed at [facility], [place] [address] [place], [place] [address]   Pregnancy, urine     Status: None   Collection Time: 01/06/18  2:02 PM  Result Value Ref Range   Preg Test, Ur NEGATIVE NEGATIVE    Comment:        THE SENSITIVITY OF THIS METHODOLOGY IS >20 mIU/mL. Performed at [facility], [place] [address], [place], [place] [address]   Urine rapid drug screen (hosp performed)not at [facility]     Status: None   Collection Time:  01/06/18  2:02 PM  Result Value Ref Range   Opiates NONE DETECTED NONE DETECTED   Cocaine NONE DETECTED NONE DETECTED   Benzodiazepines NONE DETECTED NONE DETECTED   Amphetamines NONE DETECTED NONE DETECTED   Tetrahydrocannabinol NONE DETECTED NONE DETECTED   Barbiturates NONE DETECTED NONE DETECTED    Comment: (NOTE) DRUG SCREEN FOR MEDICAL PURPOSES ONLY.  IF CONFIRMATION IS NEEDED FOR ANY PURPOSE, NOTIFY LAB WITHIN 5 DAYS. LOWEST DETECTABLE LIMITS FOR URINE DRUG SCREEN Drug Class                     Cutoff (ng/mL) Amphetamine and metabolites    1000 Barbiturate and metabolites    200 Benzodiazepine                 200  Tricyclics and metabolites     300 Opiates and metabolites        300 Cocaine and metabolites        300 THC                            50 Performed at [facility], [place] [address], [place], [place] [address]   T4, free     Status: None   Collection Time: 01/07/18  7:16 AM  Result Value Ref Range   Free T4 0.74 0.61 - 1.12 ng/dL    Comment: (NOTE) Biotin ingestion may interfere with free T4 tests. If the results are inconsistent with the TSH level, previous test results, or the clinical presentation, then consider biotin interference. If needed, order repeat testing after stopping biotin. Performed at [facility], [place] [address], [place], [place] [address]     Blood Alcohol level:  Lab Results  Component Value Date   ETH <5 71/05/2693    Metabolic Disorder Labs: Lab Results  Component Value Date   HGBA1C 5.4 01/06/2018   MPG 108.28 01/06/2018   No results found for: PROLACTIN Lab Results  Component Value Date   CHOL 218 (H) 01/06/2018   TRIG 134 01/06/2018   HDL 33 (L) 01/06/2018   CHOLHDL 6.6 01/06/2018   VLDL 27 01/06/2018   LDLCALC 158 (H) 01/06/2018    Physical Findings: AIMS:  , ,  ,  ,    CIWA:    COWS:     Musculoskeletal: Strength & Muscle Tone: within normal limits Gait & Station: normal Patient  leans: N/A  Psychiatric Specialty Exam: Physical Exam  Vitals reviewed. Constitutional: She appears well-developed.  Neurological: She is alert.  Psychiatric: She has a normal mood and affect. Her behavior is normal.    Review of Systems  Cardiovascular: Negative for chest pain and palpitations.  Neurological: Negative for dizziness and headaches.  Psychiatric/Behavioral: Positive for depression. Negative for suicidal ideas. The patient is nervous/anxious.   All other systems reviewed and are negative.   Blood pressure (!) 141/94, pulse (!) 114, temperature 98.2 F (36.8 C), temperature source Oral, resp. rate 16, height 5' 6.75" (1.695 m), weight (!) 142.9 kg (315 lb), SpO2 100 %.Body mass index is 49.71 kg/m.  General Appearance: Casual and Guarded  Eye Contact:  Fair  Speech:  Clear and Coherent  Volume:  Normal  Mood:  Anxious and Depressed  Affect:  Depressed and Flat  Thought Process:  Coherent  Orientation:  Full (Time, Place, and Person)  Thought Content:  Logical and Hallucinations: None  Suicidal Thoughts:  No  Homicidal Thoughts:  No  Memory:  Immediate;   Fair Recent;   Fair Remote;   Fair  Judgement:  Fair  Insight:  Fair  Psychomotor Activity:  Normal  Concentration:  Concentration: Fair  Recall:  [employer] of Knowledge:  Fair  Language:  Good  Akathisia:  No  Handed:  Right  AIMS (if indicated):     Assets:  Communication Skills Desire for Improvement Resilience Social Support  ADL's:  Intact  Cognition:  WNL  Sleep:  Number of Hours: 5.5     Treatment Plan Summary: Daily contact with patient to assess and evaluate symptoms and progress in treatment and Medication management   Continue with current treatment plan on 01/07/2018 except where noted   Mood stabilization:   Continue with Neurontin 231m PO TID  Continue  Prozac 20 mg  PO Q D  Continue Geodon 20 mg PO BID with meals   HTN:  Continue Inderal 10 mg  PO BID, Lisinopril 20 mg     and Microzide 12.32m   Anxiety  Continue Klonopin 0.521mPO PRN    Reviewed labs:  TSH 5.93-T4 0.74 - T3 pending results  BAL - , UDS -  Will continue to monitor vitals ,medication compliance and treatment side effects while patient is here.  CSW will start working on disposition.  Patient to participate in therapeutic milieu  TaDerrill CenterNP 01/07/2018, 11:34 AM

## 2018-01-07 NOTE — BHH Group Notes (Signed)
Adult Psychoeducational Group Note  Date:  01/07/2018 Time:  4:30 AM  Group Topic/Focus:  Wrap-Up Group:   The focus of this group is to help patients review their daily goal of treatment and discuss progress on daily workbooks.  Participation Level:  Active  Participation Quality:  Appropriate and Attentive  Affect:  Appropriate  Cognitive:  Alert and Appropriate  Insight: Appropriate and Good  Engagement in Group:  Engaged  Modes of Intervention:  Discussion and Education  Additional Comments:  Pt attended and participated in wrap up group. Pt rated their day a 1 because they "had a rough day". Pt goal was to get back on their meds, and they were able to get the Dr to regulate those for them. A positive noted by the pt was that they were in their room all day.   Chrisandra NettersOctavia A Januel Doolan 01/07/2018, 4:30 AM

## 2018-01-07 NOTE — Progress Notes (Signed)
Recreation Therapy Notes  Animal-Assisted Activity (AAA) Program Checklist/Progress Notes Patient Eligibility Criteria Checklist & Daily Group note for Rec TxIntervention  Date: 01.15.2019 Time: 2:45pm [address]   AAA/T Program Assumption of Risk Form signed by Patient/ or Parent Legal Guardian Yes  Patient is free of allergies or sever asthma Yes  Patient reports no fear of animals Yes  Patient reports no history of cruelty to animals Yes  Patient understands his/her participation is voluntary Yes  Patient washes hands before animal contact Yes  Patient washes hands after animal contact Yes  Behavioral Response: Appropriate   Education:Hand Washing, Appropriate Animal Interaction   Education Outcome: Acknowledges education.   Clinical Observations/Feedback: Patient attended session and interacted appropriately with therapy dog and peers.   Marykay Lexenise L Ayleah Hofmeister, LRT/CTRS         Ashlen Kiger L 01/07/2018 3:05 PM
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
## 2018-01-07 NOTE — Progress Notes (Signed)
Nursing Progress Note 1900-0730  D) Patient presents with flat affect and depressed mood. Patient did attend group but was minimal with writer this evening. Patient isolated to her room this evening. Patient requested PRN Elavil for sleep. Patient denies SI/HI/AVH or pain. Patient contracts for safety on the unit. Patient states, "my day was bad" but was vague with Clinical research associatewriter about stressors. Patient states, "I just want to get out of here".  A) Emotional support given. 1:1 interaction and active listening provided. Patient medicated as prescribed. Medications and plan of care reviewed with patient. Patient verbalized understanding without further questions. Snacks and fluids provided. Opportunities for questions or concerns presented to patient. Patient encouraged to continue to work on treatment goals. Labs, vital signs and patient behavior monitored throughout shift. Patient safety maintained with q15 min safety checks. High fall risk precautions in place and reviewed with patient; patient verbalized understanding.  R) Patient receptive to interaction with nurse. Patient remains safe on the unit at this time. Patient denies any adverse medication reactions at this time. Patient is resting in bed without complaints. Will continue to monitor.

## 2018-01-08 DIAGNOSIS — Z634 Disappearance and death of family member: Secondary | ICD-10-CM

## 2018-01-08 LAB — T3, FREE: T3 FREE: 3.5 pg/mL (ref 2.0–4.4)

## 2018-01-08 MED ORDER — FLUOXETINE HCL 20 MG PO CAPS
40.0000 mg | ORAL_CAPSULE | Freq: Every day | ORAL | Status: DC
  Administered 2018-01-09 – 2018-01-11 (×3): 40 mg via ORAL
  Filled 2018-01-08 (×5): qty 2

## 2018-01-08 MED ORDER — GABAPENTIN 400 MG PO CAPS
400.0000 mg | ORAL_CAPSULE | Freq: Three times a day (TID) | ORAL | Status: DC
  Administered 2018-01-08 – 2018-01-14 (×18): 400 mg via ORAL
  Filled 2018-01-08 (×8): qty 1
  Filled 2018-01-08: qty 21
  Filled 2018-01-08 (×3): qty 1
  Filled 2018-01-08: qty 21
  Filled 2018-01-08 (×4): qty 1
  Filled 2018-01-08: qty 21
  Filled 2018-01-08 (×6): qty 1

## 2018-01-08 MED ORDER — FLUOXETINE HCL 20 MG PO CAPS
20.0000 mg | ORAL_CAPSULE | Freq: Once | ORAL | Status: AC
  Administered 2018-01-08: 20 mg via ORAL
  Filled 2018-01-08: qty 1

## 2018-01-08 NOTE — Progress Notes (Addendum)
Nursing Progress Note 1900-0730  D) Patient presents with depressed mood and is anxious. Patient did attend group and was seen interactive in the milieu. Patient requests PRN Elavil and Klonopin this evening at bedtime. Patient states, "I just want to break through that door, break into my locker, get my keys- not my phone though, just my keys, and drive into something. I shouldn't even be thinking like this. I've been here since Sunday and I don't feel better at all". Patient reports passive SI but denies HI/AVH or pain. Patient does contract for safety on the unit and has no plan to harm herself on the unit. Patient verbally agrees to seek staff if she feels like harming herself here. Patient denies that she would actually attempt to elope from the unit. Charge nurse, [facility] and [facility] notified of patient's comments.  A) Emotional support given. 1:1 interaction and active listening provided. Patient medicated as prescribed. Medications and plan of care reviewed with patient. Patient verbalized understanding without further questions. Snacks and fluids provided. Opportunities for questions or concerns presented to patient. Patient encouraged to continue to work on treatment goals. Labs, vital signs and patient behavior monitored throughout shift. Patient safety maintained with q15 min safety checks. High fall risk precautions in place and reviewed with patient; patient verbalized understanding.  R) Patient receptive to interaction with nurse. Patient remains safe on the unit at this time. Patient denies any adverse medication reactions at this time. Patient is resting in bed without complaints. Will continue to monitor.

## 2018-01-08 NOTE — [facility] Group Notes (Signed)
[facility] 01/08/2018 1:15pm  Type of Therapy: Mental Health Association Presentation  Participation Level: Active  Participation Quality: Attentive  Affect: Appropriate  Cognitive: Oriented  Insight: Developing/Improving  Engagement in Therapy: Engaged  Modes of Intervention: Discussion, Education and Socialization  Summary of Progress/Problems: Mental Health Association (MHA) Speaker came to talk about his personal journey with mental health. The pt processed ways by which to relate to the speaker. MHA speaker provided handouts and educational information pertaining to groups and services offered by the [facility]. Pt was engaged in speaker's presentation and was receptive to resources provided.    Lorri FrederickWierda, Jimia Gentles Jon, LCSW 01/08/2018 4:06 PM

## 2018-01-08 NOTE — Progress Notes (Signed)
Recreation Therapy Notes  Date: 01/08/18 Time: 0930 Location: [address] Dayroom  Group Topic: Stress Management  Goal Area(s) Addresses:  Patient will verbalize importance of using healthy stress management.  Patient will identify positive emotions associated with healthy stress management.   Intervention: Stress Management  Activity :  Meditation.  LRT introduced the stress management group of meditation.  LRT played a meditation from the Calm app to participate in the meditation.  Patients were to listen and follow along with the meditation in order to engage in the activity.  Education:  Stress Management, Discharge Planning.   Education Outcome: Acknowledges edcuation/In group clarification offered/Needs additional education  Clinical Observations/Feedback: Pt did not attend group.    Caroll RancherMarjette Rayson Rando, LRT/CTRS         Caroll RancherLindsay, Tamyra Fojtik A 01/08/2018 12:35 PM
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
## 2018-01-08 NOTE — Progress Notes (Signed)
DAR NOTE: Pt present with flat affect and depressed mood in the unit. Pt has been isolating a lot in her room. Pt denies physical pain, took all her meds as scheduled. As per self inventory, pt had a poor night sleep, poor appetite, low energy, and poor concentration. Pt rate depression at 10, hopeless ness at 8, and anxiety at 10. Pt's safety ensured with 15 minute and environmental checks. Pt currently denies SI/HI and A/V hallucinations. Pt verbally agrees to seek staff if SI/HI or A/VH occurs and to consult with staff before acting on these thoughts. Will continue POC.

## 2018-01-08 NOTE — Progress Notes (Signed)
Patient states that she had a bad day overall. She explained that she dealt with a number of bad telephone calls in which her family members were very dramatic which made her feel depressed and suicidal. She does not wish to speak with anyone on the telephone and would like to focus on herself for now. Her goal for tomorrow is to get out of bed and spend more time attending groups. She admits to having been off of her medication prior to being admitted to the hospital.

## 2018-01-09 MED ORDER — LITHIUM CARBONATE 300 MG PO CAPS
600.0000 mg | ORAL_CAPSULE | Freq: Every day | ORAL | Status: DC
  Administered 2018-01-09 – 2018-01-13 (×5): 600 mg via ORAL
  Filled 2018-01-09: qty 2
  Filled 2018-01-09: qty 7
  Filled 2018-01-09 (×5): qty 2
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
## 2018-01-09 NOTE — Progress Notes (Signed)
DAR NOTE: Patient presents with anxious affect and depressed mood. Pt has stayed in the room most f the day. Pt complained of increased depression and feels she is not getting nay batter. Reports poor sleep, fair appetite, low energy and poor concentration. Pt states she has no motivation to do anything. Denies pain, auditory and visual hallucinations.  Rates depression at 10, hopelessness at 9, and anxiety at 10.  Maintained on routine safety checks.  Medications given as prescribed.  Support and encouragement offered as needed.  States goal for today is "to get back on my med and try to go to groups to day. Will continue to monitor.

## 2018-01-09 NOTE — [facility] Group Notes (Signed)
[facility] LCSW Group Therapy Note  Date/Time: 01/09/18, 1315  Type of Therapy/Topic:  Group Therapy:  Balance in Life  Participation Level:  Did not attend  Description of Group:    This group will address the concept of balance and how it feels and looks when one is unbalanced. Patients will be encouraged to process areas in their lives that are out of balance, and identify reasons for remaining unbalanced. Facilitators will guide patients utilizing problem- solving interventions to address and correct the stressor making their life unbalanced. Understanding and applying boundaries will be explored and addressed for obtaining  and maintaining a balanced life. Patients will be encouraged to explore ways to assertively make their unbalanced needs known to significant others in their lives, using other group members and facilitator for support and feedback.  Therapeutic Goals: 1. Patient will identify two or more emotions or situations they have that consume much of in their lives. 2. Patient will identify signs/triggers that life has become out of balance:  3. Patient will identify two ways to set boundaries in order to achieve balance in their lives:  4. Patient will demonstrate ability to communicate their needs through discussion and/or role plays  Summary of Patient Progress:          Therapeutic Modalities:   Cognitive Behavioral Therapy Solution-Focused Therapy Assertiveness Training  Daleen SquibbGreg Marselino Slayton, LCSW

## 2018-01-09 NOTE — Progress Notes (Signed)
Psychoeducational Group Note  Date:  01/09/2018 Time:  0915 Group Topic/Focus:  TED talk on vulnerability   Participation Level: Did Not Attend  Participation Quality:  Not Applicable  Affect:  Not Applicable  Cognitive:  Not Applicable  Insight:  Not Applicable  Engagement in Group: Not Applicable  Additional Comments:    Gwenevere Ghazili, Makynzie Dobesh Patience 01/09/2018, 1:06 PM

## 2018-01-09 NOTE — Progress Notes (Signed)
Nursing Progress Note 1900-0730  D) Patient presents with flat affect and depressed mood. Patient reports that her day "started off bad" but that "it could always be worse". Patient did attend group. Patient is seen interactive in the milieu. Patient denies SI/HI/AVH or pain. Patient contracts for safety on the unit. Patient requests PRN Elavil and Klonopin at bedtime. Patient provided first dose of Lithium; educated provided. Patient verbalized understanding and denies concerns at this time.  A) Emotional support given. 1:1 interaction and active listening provided. Patient medicated as prescribed. Medications and plan of care reviewed with patient. Patient verbalized understanding without further questions. Snacks and fluids provided. Opportunities for questions or concerns presented to patient. Patient encouraged to continue to work on treatment goals. Labs, vital signs and patient behavior monitored throughout shift. Patient safety maintained with q15 min safety checks. High fall risk precautions in place and reviewed with patient; patient verbalized understanding.  R) Patient receptive to interaction with nurse. Patient remains safe on the unit at this time. Patient denies any adverse medication reactions at this time. Patient is resting in bed without complaints. Will continue to monitor.

## 2018-01-09 NOTE — [facility] Suicide Risk Assessment (Signed)
[facility] INPATIENT:  Family/Significant Other Suicide Prevention Education  Suicide Prevention Education:  Patient Refusal for Family/Significant Other Suicide Prevention Education: The patient Veronica Waters has refused to provide written consent for family/significant other to be provided Family/Significant Other Suicide Prevention Education during admission and/or prior to discharge.  Physician notified.  Lorri FrederickWierda, Kariann Wecker Jon, LCSW 01/09/2018, 7:55 AM

## 2018-01-10 MED ORDER — CLONAZEPAM 0.5 MG PO TABS
0.5000 mg | ORAL_TABLET | Freq: Once | ORAL | Status: AC
  Administered 2018-01-10: 0.5 mg via ORAL

## 2018-01-10 NOTE — Progress Notes (Signed)
Recreation Therapy Notes  Date: 01/10/18 Time: 0930 Location: 300 Hall Dayroom  Group Topic: Stress Management  Goal Area(s) Addresses:  Patient will verbalize importance of using healthy stress management.  Patient will identify positive emotions associated with healthy stress management.   Intervention: Stress Management  Activity :  Meditation.  LRT introduced the stress management technique of meditation.  LRT played a meditation dealing with gratitude.  Patients were to listen and follow along as the meditation played.  Education:  Stress Management, Discharge Planning.   Education Outcome: Acknowledges edcuation/In group clarification offered/Needs additional education  Clinical Observations/Feedback: Pt did not attend group.    Caroll RancherMarjette Kerem Gilmer, LRT/CTRS         Lillia AbedLindsay, Skylur Fuston A 01/10/2018 1:17 PM

## 2018-01-10 NOTE — Progress Notes (Signed)
Nutrition Education Note  Pt attended group focusing on general, healthful nutrition education.  RD emphasized the importance of eating regular meals and snacks throughout the day. Consuming sugar-free beverages and incorporating fruits and vegetables into diet when possible. Provided examples of healthy snacks. Patient encouraged to leave group with a goal to improve nutrition/healthy eating.   Diet Order: Diet regular Room service appropriate? Yes; Fluid consistency: Thin Pt is also offered choice of unit snacks mid-morning and mid-afternoon.  Pt is eating as desired.   If additional nutrition issues arise, please consult RD.    Taite Schoeppner, MS, RD, LDN, CNSC Inpatient Clinical Dietitian Pager # [phone] After hours/weekend pager # [phone]     

## 2018-01-10 NOTE — Progress Notes (Signed)
Patient described her day as having been "half and half". She states that she remains depressed, however, her nurse (Pattie) was able to have a long talk with her. She felt that her nurse was very supportive. She also mentioned that she spent less time in her room today. As for the theme of the day, her relapse prevention strategy will be to attend counseling sessions and to continue to take her medication on a regular basis.
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
## 2018-01-10 NOTE — Tx Team (Signed)
Interdisciplinary Treatment and Diagnostic Plan Update  01/10/2018 Time of Session: 0940 Veronica Waters MRN: [id number]  Principal Diagnosis: Severe recurrent major depression without psychotic features [facility])  Secondary Diagnoses: Principal Problem:   Severe recurrent major depression without psychotic features (HCC)   Current Medications:  Current Facility-Administered Medications  Medication Dose Route Frequency Provider Last Rate Last Dose  . albuterol (PROVENTIL HFA;VENTOLIN HFA) 108 (90 Base) MCG/ACT inhaler 1-2 puff  1-2 puff Inhalation Q6H PRN Nira Conn A, NP      . alum & mag hydroxide-simeth (MAALOX/MYLANTA) 200-200-20 MG/5ML suspension 30 mL  30 mL Oral Q4H PRN Nira Conn A, NP      . amitriptyline (ELAVIL) tablet 50 mg  50 mg Oral QHS PRN Donell Sievert E, PA-C   50 mg at 01/09/18 2101  . clonazePAM (KLONOPIN) tablet 0.5 mg  0.5 mg Oral BID PRN Money, Gerlene Burdock, FNP   0.5 mg at 01/09/18 2101  . FLUoxetine (PROZAC) capsule 40 mg  40 mg Oral Daily Izediuno, Delight Ovens, MD   40 mg at 01/10/18 0806  . gabapentin (NEURONTIN) capsule 400 mg  400 mg Oral TID Georgiann Cocker, MD   400 mg at 01/10/18 0806  . hydrochlorothiazide (MICROZIDE) capsule 12.5 mg  12.5 mg Oral Daily Nira Conn A, NP   12.5 mg at 01/10/18 4098  . hydrOXYzine (ATARAX/VISTARIL) tablet 25 mg  25 mg Oral TID PRN Jackelyn Poling, NP   25 mg at 01/08/18 1838  . lisinopril (PRINIVIL,ZESTRIL) tablet 20 mg  20 mg Oral Daily Nira Conn A, NP   20 mg at 01/10/18 0807  . lithium carbonate capsule 600 mg  600 mg Oral QHS Izediuno, Delight Ovens, MD   600 mg at 01/09/18 2101  . magnesium hydroxide (MILK OF MAGNESIA) suspension 30 mL  30 mL Oral Daily PRN Nira Conn A, NP      . propranolol (INDERAL) tablet 10 mg  10 mg Oral BID Nira Conn A, NP   10 mg at 01/10/18 0806  . ziprasidone (GEODON) capsule 20 mg  20 mg Oral BID WC Money, Gerlene Burdock, FNP   20 mg at 01/10/18 1191   PTA Medications: Medications Prior to  Admission  Medication Sig Dispense Refill Last Dose  . acetaminophen (TYLENOL) 500 MG tablet Take 500-1,000 mg by mouth every 6 (six) hours as needed for headache.   Past Week at Unknown time  . albuterol (PROVENTIL HFA;VENTOLIN HFA) 108 (90 Base) MCG/ACT inhaler Inhale 1-2 puffs into the lungs every 6 (six) hours as needed for wheezing or shortness of breath. 1 Inhaler 0 Past Week at Unknown time  . clonazePAM (KLONOPIN) 0.5 MG tablet Take 0.5 mg by mouth 3 (three) times daily as needed for anxiety.   Past Month at Unknown time  . FLUoxetine (PROZAC) 20 MG capsule Take 20 mg by mouth daily.   Past Month at Unknown time  . gabapentin (NEURONTIN) 300 MG capsule Take 2 capsules (600 mg total) by mouth 3 (three) times daily. (Patient taking differently: Take 900 mg by mouth 3 (three) times daily. ) 180 capsule 0 Past Month at Unknown time  . lurasidone (LATUDA) 20 MG TABS tablet Take 1 tablet (20 mg total) by mouth daily with supper. For mood control (Patient taking differently: Take 40 mg by mouth daily with supper. For mood control) 30 tablet 0 Past Month at Unknown time  . zolpidem (AMBIEN) 5 MG tablet Take 5 mg by mouth at bedtime as needed for sleep.  Past Month at Unknown time  . DULoxetine 40 MG CPEP Take 40 mg by mouth daily. For mood control (Patient not taking: Reported on 01/06/2018) 30 capsule 0 Not Taking at Unknown time  . hydrochlorothiazide (MICROZIDE) 12.5 MG capsule Take 1 capsule (12.5 mg total) by mouth daily. 30 capsule 0 more than a month  . lisinopril (PRINIVIL,ZESTRIL) 20 MG tablet Take 1 tablet (20 mg total) by mouth daily. For high blood pressure 30 tablet 0 more than a month  . prazosin (MINIPRESS) 1 MG capsule Take 1 capsule (1 mg total) by mouth at bedtime. PTSD symptoms (Patient not taking: Reported on 01/06/2018) 30 capsule 0 Not Taking at Unknown time  . propranolol (INDERAL) 10 MG tablet Take 1 tablet (10 mg total) by mouth 2 (two) times daily. (Patient not taking: Reported  on 01/06/2018) 60 tablet 0 Not Taking at Unknown time    Patient Stressors: Health problems Marital or family conflict Medication change or noncompliance  Patient Strengths: Ability for insight Active sense of humor Average or above average intelligence  Treatment Modalities: Medication Management, Group therapy, Case management,  1 to 1 session with clinician, Psychoeducation, Recreational therapy.   Physician Treatment Plan for Primary Diagnosis: Severe recurrent major depression without psychotic features (HCC) Long Term Goal(s): Improvement in symptoms so as ready for discharge Improvement in symptoms so as ready for discharge   Short Term Goals: Ability to demonstrate self-control will improve Compliance with prescribed medications will improve Ability to verbalize feelings will improve Ability to disclose and discuss suicidal ideas  Medication Management: Evaluate patient's response, side effects, and tolerance of medication regimen.  Therapeutic Interventions: 1 to 1 sessions, Unit Group sessions and Medication administration.  Evaluation of Outcomes: Progressing  Physician Treatment Plan for Secondary Diagnosis: Principal Problem:   Severe recurrent major depression without psychotic features (HCC)  Long Term Goal(s): Improvement in symptoms so as ready for discharge Improvement in symptoms so as ready for discharge   Short Term Goals: Ability to demonstrate self-control will improve Compliance with prescribed medications will improve Ability to verbalize feelings will improve Ability to disclose and discuss suicidal ideas     Medication Management: Evaluate patient's response, side effects, and tolerance of medication regimen.  Therapeutic Interventions: 1 to 1 sessions, Unit Group sessions and Medication administration.  Evaluation of Outcomes: Progressing   RN Treatment Plan for Primary Diagnosis: Severe recurrent major depression without psychotic features  (HCC) Long Term Goal(s): Knowledge of disease and therapeutic regimen to maintain health will improve  Short Term Goals: Ability to identify and develop effective coping behaviors will improve and Compliance with prescribed medications will improve  Medication Management: RN will administer medications as ordered by provider, will assess and evaluate patient's response and provide education to patient for prescribed medication. RN will report any adverse and/or side effects to prescribing provider.  Therapeutic Interventions: 1 on 1 counseling sessions, Psychoeducation, Medication administration, Evaluate responses to treatment, Monitor vital signs and CBGs as ordered, Perform/monitor CIWA, COWS, AIMS and Fall Risk screenings as ordered, Perform wound care treatments as ordered.  Evaluation of Outcomes: Progressing   LCSW Treatment Plan for Primary Diagnosis: Severe recurrent major depression without psychotic features (HCC) Long Term Goal(s): Safe transition to appropriate next level of care at discharge, Engage patient in therapeutic group addressing interpersonal concerns.  Short Term Goals: Engage patient in aftercare planning with referrals and resources, Identify triggers associated with mental health/substance abuse issues and Increase skills for wellness and recovery  Therapeutic Interventions: Assess for  all discharge needs, 1 to 1 time with Child psychotherapistocial worker, Explore available resources and support systems, Assess for adequacy in community support network, Educate family and significant other(s) on suicide prevention, Complete Psychosocial Assessment, Interpersonal group therapy.  Evaluation of Outcomes: Progressing   Progress in Treatment: Attending groups: No. Participating in groups: No. Taking medication as prescribed: Yes. Toleration medication: Yes. Family/Significant other contact made: No, will contact:  pt refused Patient understands diagnosis: Yes. Discussing patient  identified problems/goals with staff: Yes. Medical problems stabilized or resolved: Yes. Denies suicidal/homicidal ideation: Yes. Issues/concerns per patient self-inventory: No. Other: none  New problem(s) identified: No, Describe:  none  New Short Term/Long Term Goal(s):Pt goal: to get my medication back on track.  Discharge Plan or Barriers: [facility]  Reason for Continuation of Hospitalization: Depression Medication stabilization  Estimated Length of Stay: 3-5 days.  Attendees: Patient: Veronica Waters 01/10/2018   Physician: Dr Jama Flavorsobos, MD 01/10/2018   Nursing: Quintella ReichertBeverly Knight, RN 01/10/2018   RN Care Manager: 01/10/2018   Social Worker: Daleen SquibbGreg Tanique Matney, LCSW 01/10/2018   Recreational Therapist:  01/10/2018   Other:  01/10/2018   Other:  01/10/2018   Other: 01/10/2018     Scribe for Treatment Team: Lorri FrederickWierda, Aleister Lady Jon, LCSW 01/10/2018 9:44 AM

## 2018-01-10 NOTE — [facility] Group Notes (Signed)
  [facility] LCSW Group Therapy Note  Date/Time: 01/10/18, 1315  Type of Therapy/Topic:  Group Therapy:  Emotion Regulation  Participation Level:  Did Not Attend   Mood:  Description of Group:    The purpose of this group is to assist patients in learning to regulate negative emotions and experience positive emotions. Patients will be guided to discuss ways in which they have been vulnerable to their negative emotions. These vulnerabilities will be juxtaposed with experiences of positive emotions or situations, and patients challenged to use positive emotions to combat negative ones. Special emphasis will be placed on coping with negative emotions in conflict situations, and patients will process healthy conflict resolution skills.  Therapeutic Goals: 1. Patient will identify two positive emotions or experiences to reflect on in order to balance out negative emotions:  2. Patient will label two or more emotions that they find the most difficult to experience:  3. Patient will be able to demonstrate positive conflict resolution skills through discussion or role plays:   Summary of Patient Progress:       Therapeutic Modalities:   Cognitive Behavioral Therapy Feelings Identification Dialectical Behavioral Therapy  Daleen SquibbGreg Aisley Whan, LCSW
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
## 2018-01-10 NOTE — Progress Notes (Signed)
D.  Pt remains extremely anxious, states that if she left here she would "just go die".  Pt states she will contract for safety on the unit, states "there ain't nothing I could use to hurt myself here".  Reminded Pt of coping skills she had learned with Nurse Patty today and Pt states "she did talk to me about that but I just flip my shit"    A.  Spoke with NP and received one time order for additional dose of Klonopin 0.5 mg.  R.  Pt took dose and is sitting up in bed.  She remains safe at this time, will continue to monitor.

## 2018-01-10 NOTE — Progress Notes (Signed)
D Pt is observed OOB UAL on the 400 hall today. Tolerates well. Makes good eye contact. States " I remember you...you took care of me last time  I Iwas here ". A She completes her daily assessment. On this she wrote she has not had any SI today and she rated her depression, hopelessness and anxeity " 7/5/9", respectively. R She requested and was given a prn kllonopin stating " I just need something for my anxiety". Pt given prn klonopin per MD order and also writer spoke with pt about practicing deep breathing and relaxation today and pt states she will try " to do this". Safetyin palce

## 2018-01-11 MED ORDER — FLUOXETINE HCL 20 MG PO CAPS
60.0000 mg | ORAL_CAPSULE | Freq: Every day | ORAL | Status: DC
  Administered 2018-01-12 – 2018-01-14 (×3): 60 mg via ORAL
  Filled 2018-01-11 (×3): qty 3
  Filled 2018-01-11: qty 21
  Filled 2018-01-11: qty 3

## 2018-01-11 MED ORDER — LORAZEPAM 1 MG PO TABS
ORAL_TABLET | ORAL | Status: AC
  Administered 2018-01-11: 1 mg via ORAL
  Filled 2018-01-11: qty 1

## 2018-01-11 MED ORDER — ACETAMINOPHEN 325 MG PO TABS
650.0000 mg | ORAL_TABLET | Freq: Four times a day (QID) | ORAL | Status: DC | PRN
  Administered 2018-01-11 – 2018-01-14 (×2): 650 mg via ORAL
  Filled 2018-01-11 (×2): qty 2

## 2018-01-11 MED ORDER — LORAZEPAM 0.5 MG PO TABS
0.5000 mg | ORAL_TABLET | Freq: Three times a day (TID) | ORAL | Status: DC
  Administered 2018-01-11: 0.5 mg via ORAL
  Filled 2018-01-11: qty 1

## 2018-01-11 MED ORDER — LORAZEPAM 1 MG PO TABS
1.0000 mg | ORAL_TABLET | Freq: Once | ORAL | Status: AC
  Administered 2018-01-11 – 2018-01-12 (×2): 1 mg via ORAL

## 2018-01-11 NOTE — Progress Notes (Signed)
D.  Pt presents with flat affect but pleasant.  States doctor put her back on Ativan rather than Klonopin and that this works better for her anxiety.  Pt states that providers in this hospital listen to her and that is why she prefers to come here when necessary.  Pt did attend evening wrap up group, and has had more interaction this evening than last.  Pt denies HI/AVH but does continue to endorse passive SI.  Pt contracts for safety on the unit.  A.  Support and encouragement offered, medication given as ordered  R.  Pt remains safe on the unit, will continue to monitor.

## 2018-01-11 NOTE — Progress Notes (Signed)
D Patient has been in her bed...agitated, anxious, sad and " tore up" the majority of the day. She is guarded, sad, emotional and says " I don't know what to do..I'm give out.Marland Kitchen.Marland Kitchen.I'm tired and I'm tired of feeling tired". A Writer received pt's self inventory from pt first thing this morning and on this she wrote she has experienced SI today but she is able to contract for safety and she rated her dep" 09/29/09", respectively. ression, hopelessness and anxeity . She is adamant that she not attend groups today" I can't ..I can't concentrate.I don't even know what I want anymore....". This evening at shift change pt became agitated, started to cry and was unconsolable. Pt stated " nobody saw me today.or yesterday...Marland Kitchen."Pt refused prn vistaril Writer called NP  and received one time order for ativan and it was given to pt pfr MD order. R Pt contracts for safety..Marland Kitchen
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
## 2018-01-11 NOTE — Progress Notes (Addendum)
Adult Psychoeducational Group Note  Date:  01/11/2018 Time:  9:32 PM  Group Topic/Focus:  Wrap-Up Group:   The focus of this group is to help patients review their daily goal of treatment and discuss progress on daily workbooks.  Participation Level:  Active  Participation Quality:  Appropriate  Affect:  Appropriate  Cognitive:  Appropriate  Insight: Appropriate  Engagement in Group:  Engaged  Modes of Intervention:  Discussion  Additional Comments:  Patient attended group and said that her day was a 3. Patient said she had a very bad start to her day.  Thanks to Nurse Patty, who helped her though the day by counseling her and allowing her to speak to the physician to have her medications changed. Patient was asked if she had step parents and she said she did. In fact, they are excellent parents and she calls them Mom and Dad.  Veronica Waters W [name] 01/11/2018, 9:32 PM

## 2018-01-11 NOTE — [facility] Group Notes (Signed)
LCSW Group Therapy Note  01/11/2018     10:15-11:15AM  Type of Therapy and Topic:  Group Therapy:  Decisional Balance/Substance Use  Participation Level:  Did not attend        Description of Group:  The main focus of today's process group was learning how to use a decisional balance exercise to make a decision about whether to change an unhealthy coping skill, as well as how to use the information gathered in the actual process of planning that change.  Patients listed some of their most frequently utilized unhealthy coping techniques and CSW pointed out the similarities.  Motivational Interviewing was utilized to help patients explore in-depth the perceived benefits and costs of unhealthy coping techniques (with an emphasis on drinking & drugging) as well as the benefits and costs of replacing that with other, healthy coping skills.  A handout was distributed for patients to be able to do this exercise for themselves.     Therapeutic Goals 1. Patient will be able to utilize the decision balance exercise on their own 2. Patient will list coping skills they use to fulfill their needs 3. Patient will identify the differences between healthy and  unhealthy coping skills 4. Patient will verbalize the costs and benefits of drinking/drugging versus making the choice to change 5. Patient will learn how about how using this exercise on an ongoing basis can help to make decisions about whether to make a change, and how everyone is at a different place in that decision-making process.  Summary of Patient Progress: N/A  Therapeutic Modalities Cognitive Behavioral Therapy Motivational Interviewing  Sheryll Dymek Grossman-Orr, LCSW 01/11/2018, 1:58 PM    

## 2018-01-12 DIAGNOSIS — E663 Overweight: Secondary | ICD-10-CM

## 2018-01-12 DIAGNOSIS — Z915 Personal history of self-harm: Secondary | ICD-10-CM

## 2018-01-12 DIAGNOSIS — Z630 Problems in relationship with spouse or partner: Secondary | ICD-10-CM

## 2018-01-12 DIAGNOSIS — Z6841 Body Mass Index (BMI) 40.0 and over, adult: Secondary | ICD-10-CM

## 2018-01-12 DIAGNOSIS — Z599 Problem related to housing and economic circumstances, unspecified: Secondary | ICD-10-CM

## 2018-01-12 MED ORDER — LORAZEPAM 1 MG PO TABS: 1.0000 mg | ORAL_TABLET | Freq: Once | ORAL | Status: DC

## 2018-01-12 MED ORDER — LORAZEPAM 0.5 MG PO TABS
0.5000 mg | ORAL_TABLET | Freq: Three times a day (TID) | ORAL | Status: DC | PRN
  Administered 2018-01-12 – 2018-01-14 (×7): 0.5 mg via ORAL
  Filled 2018-01-12 (×7): qty 1

## 2018-01-12 MED ORDER — LORAZEPAM 1 MG PO TABS
ORAL_TABLET | ORAL | Status: AC
  Administered 2018-01-12: 1 mg via ORAL
  Filled 2018-01-12: qty 1

## 2018-01-12 NOTE — Progress Notes (Signed)
1:1 NSG note:  D.  Pt sitting up in bed on approach.  Pt did not attend evening wrap up group.  Pt appears depressed, minimal responses to questions.  When asked what time she would like to take her night medications Pt responded "I don't care" without eye contact.  When asked if she would like them now, she responded with the same.  Pt would not respond to question of whether or not she is suicidal at this time.  Pt would not respond when asked if she would contract for safety.  A.  Support and encouragement offered, medications brought to Pt and Pt did take medications without issue.  Pt asked for and was given Gatorade.  R.  Pt remains safe and is currently sitting in dayroom with sitter.  Will continue 1:1 as ordered for Pt safety.

## 2018-01-12 NOTE — [facility] Group Notes (Signed)
[facility]  Date/Time:  01/12/2018 10:00-11:00AM  Type of Therapy and Topic:  Group Therapy:  Healthy and Unhealthy Supports  Participation Level:  Active   Description of Group:  Patients in this group were introduced to the idea of adding a variety of healthy supports to address the various needs in their lives. The picture on the front of Sunday's workbook was used to demonstrate why more supports are needed in every patient's life.  Patients identified and described healthy supports versus unhealthy supports in general, then gave examples of each in their own lives.   They discussed what additional healthy supports could be helpful in their recovery and wellness after discharge in order to prevent future hospitalizations.   An emphasis was placed on using counselor, doctor, therapy groups, 12-step groups, and problem-specific support groups to expand supports.  They also worked as a group on developing a specific plan for several patients to deal with unhealthy supports through boundary-setting, psychoeducation with loved ones, and even termination of relationships.   Therapeutic Goals:   1)  discuss importance of adding supports to stay well once out of the hospital  2)  compare healthy versus unhealthy supports and identify some examples of each  3)  generate ideas and descriptions of healthy supports that can be added  4)  offer mutual support about how to address unhealthy supports  5)  encourage active participation in and adherence to discharge plan    Summary of Patient Progress:  The patient did not come to group until the last 20-25 minutes and before the end of group was laughing and contributing to the discussion heavily, stating that she was really glad she came even though she had not wanted to.  The patient expressed a willingness to add learning more about her own diagnosis to help in her recovery journey.   Therapeutic Modalities:   Motivational  Interviewing Brief Solution-Focused Therapy  Ambrose MantleMareida Grossman-Orr, LCSW

## 2018-01-12 NOTE — Progress Notes (Signed)
Called to Pt's room and Pt sitting in bed and stating she wanted to "get OUT of this place".  Pt also stated that she is suicidal and would kill herself if she leaves.  Explained criteria for discharge to Pt and Pt states "I don't care any more".  Pt placed her hands over her face, then appeared to raise hand enough to look out and see if staff member was still there.  Explained that our goal is to keep Pt safe and that she can't be discharged until the doctor is sure she can be.  Pt would not look at staff member.  She remains on 1:1 for her safety.  Will continue to monitor.
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
## 2018-01-13 NOTE — Progress Notes (Signed)
[facility] Post 1:1 Observation Documentation  For the first (8) hours following discontinuation of 1:1 precautions, a progress note entry by nursing staff should be documented at least every 2 hours, reflecting the patient's behavior, condition, mood, and conversation.  Use the progress notes for additional entries.  Time 1:1 discontinued:  1058  Patient's Behavior:  Anxious. Sitting on the bed.   Patient's Condition:  15 minute checks performed for safety.   Patient's Conversation:  Soft & minimal logical   Veronica Waters, Veronica Waters 01/13/2018, 3:01 PM

## 2018-01-13 NOTE — Progress Notes (Signed)
1:1 NSG note:  D.  Pt  Has rested fairly well tonight, 5.5 hours of sleep recorded.  Pt did request Vistaril prn around 0345 this morning for continued anxiety and agitation but was able to return to sleep after taking it.  No acute distress noted.  A.  1:1 continued as ordered for Pt safety  R.  Pt remains safe on the unit

## 2018-01-13 NOTE — Progress Notes (Signed)
D: Pt was in the dayroom upon initial approach.  Pt presents with anxious, depressed affect and mood.  Upon initial assessment, pt reports her day was "good" and her goal was to "go to groups."  When asked if she met goal, she states "I think."  Pt went to her room during evening group.  Later in the night, she approached writer and stated "I don't know if I should tell you, but I feel suicidal."  Pt denies having a plan and she verbally contracts for safety.  She reports she really wants to go home but she is unsure if she is ready to.  Pt denies HI, denies hallucinations, denies pain.    A: Introduced self to pt.  Actively listened to pt and offered support and encouragement. Medications administered per order.  PRN medication administered for sleep and anxiety.  Q15 minute safety checks maintained.  Encouraged pt to reiterate any concerns related to discharge to treatment team and she agrees to do so.  R: Pt is safe on the unit.  Pt is compliant with medications.  Pt verbally contracts for safety.  Will continue to monitor and assess.   

## 2018-01-13 NOTE — [facility] Group Notes (Signed)
LCSW Group Therapy Note 01/13/2018 2:54 PM  Type of Therapy and Topic: Group Therapy: Overcoming Obstacles  Participation Level: Did Not Attend  Description of Group:  In this group patients will be encouraged to explore what they see as obstacles to their own wellness and recovery. They will be guided to discuss their thoughts, feelings, and behaviors related to these obstacles. The group will process together ways to cope with barriers, with attention given to specific choices patients can make. Each patient will be challenged to identify changes they are motivated to make in order to overcome their obstacles. This group will be process-oriented, with patients participating in exploration of their own experiences as well as giving and receiving support and challenge from other group members.  Therapeutic Goals: 1. Patient will identify personal and current obstacles as they relate to admission. 2. Patient will identify barriers that currently interfere with their wellness or overcoming obstacles.  3. Patient will identify feelings, thought process and behaviors related to these barriers. 4. Patient will identify two changes they are willing to make to overcome these obstacles:   Summary of Patient Progress  Invited, chose not to attend.     Therapeutic Modalities:  Cognitive Behavioral Therapy Solution Focused Therapy Motivational Interviewing Relapse Prevention Therapy   Alcario DroughtJolan Sherrel Ploch LCSWA Clinical Social Worker

## 2018-01-13 NOTE — Progress Notes (Signed)
1:1 Note: Pt presents with a flat affect. Pt continues to report that she's increasingly anxious. Pt received Ativan this morning for anxiety. Pt asking to have Ativan increased to 1 mg TID. Pt reports passive SI. No active SI verbalized by pt at this time. Pt unable to contract for safety. Pt isolative to her room. When asked to go to groups, pt will cover her face with her hands and hold her head down. Pt remains on 1:1 observation for safety.

## 2018-01-13 NOTE — Progress Notes (Signed)
Recreation Therapy Notes  Date: 01/13/18 Time: 0930 Location: [address] Dayroom  Group Topic: Stress Management  Goal Area(s) Addresses:  Patient will verbalize importance of using healthy stress management.  Patient will identify positive emotions associated with healthy stress management.   Intervention: Stress Management  Activity :  Meditation.  LRT introduced the stress management technique of meditation.  LRT played a meditation from the Calm app dealing with forgiveness of self.  Patients were to follow along with the meditation as it played to engage in the activity.  Education:  Stress Management, Discharge Planning.   Education Outcome: Acknowledges edcuation/In group clarification offered/Needs additional education  Clinical Observations/Feedback: Pt did not attend group.     Caroll RancherMarjette Randale Carvalho, LRT/CTRS         Caroll RancherLindsay, Hunter Pinkard A 01/13/2018 12:16 PM

## 2018-01-13 NOTE — Progress Notes (Signed)
[facility] Post 1:1 Observation Documentation  For the first (8) hours following discontinuation of 1:1 precautions, a progress note entry by nursing staff should be documented at least every 2 hours, reflecting the patient's behavior, condition, mood, and conversation.  Use the progress notes for additional entries.  Time 1:1 discontinued:  1058  Patient's Behavior:  Pt observed sitting in the dayroom playing games with her peers,  Patient's Condition:  Pt verbally contracts for safety. 15 minute checks performed for safety.  Patient's Conversation: logical/coherent     Lashaya Kienitz L 01/13/2018, 5:07 PM

## 2018-01-13 NOTE — Progress Notes (Signed)
1:1 NSG note:  D.  Pt up to bathroom, then remained sitting up in bed.  Refused eye contact but did say "hey" to the new MHT reporting to be her sitter.  Pt has been resting with eyes closed, snoring respirations up to that point.  No acute distress noted.  A.  1:1 continued as ordered for patient safety  R. Pt remains safe on the unit, will continue to monitor.

## 2018-01-13 NOTE — Progress Notes (Signed)
[facility] Post 1:1 Observation Documentation  For the first (8) hours following discontinuation of 1:1 precautions, a progress note entry by nursing staff should be documented at least every 2 hours, reflecting the patient's behavior, condition, mood, and conversation.  Use the progress notes for additional entries.  Time 1:1 discontinued:  1058 am  Patient's Behavior:  Pt observed engaging with her visitors. Pt appropriate at this time.  Patient's Condition:  15 minute checks performed for safety.  Patient's Conversation:  Logical/coherent   Veronica Waters L 01/13/2018, 6:53 PM

## 2018-01-13 NOTE — [facility] Group Notes (Signed)
[facility] Group Notes:  (Nursing/MHT/Case Management/Adjunct)  Date:  01/13/2018  Time:  1600  Type of Therapy:  Psychoeducational Skills  Participation Level:  Did Not Attend  Participation Quality:    Affect:    Cognitive:    Insight:    Engagement in Group:    Modes of Intervention:    Summary of Progress/Problems:

## 2018-01-13 NOTE — Plan of Care (Signed)
  Progressing Self-Concept: Ability to disclose and discuss suicidal ideas will improve 01/13/2018 2353 - Progressing by Arrie Aranhurch, Nickalus Thornsberry J, RN Note Pt endorses SI without a plan tonight.  She verbally contracts for safety.
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
## 2018-01-13 NOTE — Progress Notes (Signed)
[facility] Post 1:1 Observation Documentation  For the first (8) hours following discontinuation of 1:1 precautions, a progress note entry by nursing staff should be documented at least every 2 hours, reflecting the patient's behavior, condition, mood, and conversation.  Use the progress notes for additional entries.  Time 1:1 discontinued:  1058 am  Patient's Behavior:  Anxious. Cooperative. Denies SI and able to contract for safety.  Patient's Condition:  Safety maintained. 15 minute checks performed for safety.  Patient's Conversation:  Logical/coherent   Dmani Mizer L 01/13/2018, 11:12 AM

## 2018-01-13 NOTE — Progress Notes (Signed)
Psychoeducational Group Note  Date:  01/13/2018 Time:  2113  Group Topic/Focus:  Wrap-Up Group:   The focus of this group is to help patients review their daily goal of treatment and discuss progress on daily workbooks.  Participation Level: Did Not Attend  Participation Quality:  Not Applicable  Affect:  Not Applicable  Cognitive:  Not Applicable  Insight:  Not Applicable  Engagement in Group: Not Applicable  Additional Comments:  The patient did not attend group since she left the dayroom prior to the outset of the evening group.   Veronica Waters, Aaliya Maultsby S 01/13/2018, 9:13 PM

## 2018-01-14 LAB — LITHIUM LEVEL: Lithium Lvl: 0.35 mmol/L — ABNORMAL LOW (ref 0.60–1.20)

## 2018-01-14 MED ORDER — LORAZEPAM 0.5 MG PO TABS: 0.5000 mg | ORAL_TABLET | Freq: Two times a day (BID) | ORAL | 0 refills | Status: DC | PRN

## 2018-01-14 MED ORDER — FLUOXETINE HCL 20 MG PO CAPS: 60.0000 mg | ORAL_CAPSULE | Freq: Every day | ORAL | 0 refills | Status: DC

## 2018-01-14 MED ORDER — AMITRIPTYLINE HCL 50 MG PO TABS: 50.0000 mg | ORAL_TABLET | Freq: Every evening | ORAL | 0 refills | Status: DC | PRN

## 2018-01-14 MED ORDER — LITHIUM CARBONATE 600 MG PO CAPS: 600.0000 mg | ORAL_CAPSULE | Freq: Every day | ORAL | 0 refills | Status: DC

## 2018-01-14 MED ORDER — HYDROXYZINE HCL 25 MG PO TABS: 25.0000 mg | ORAL_TABLET | Freq: Three times a day (TID) | ORAL | 0 refills | Status: DC | PRN

## 2018-01-14 MED ORDER — LISINOPRIL 20 MG PO TABS: 20.0000 mg | ORAL_TABLET | Freq: Every day | ORAL | 0 refills | Status: DC

## 2018-01-14 MED ORDER — HYDROCHLOROTHIAZIDE 12.5 MG PO CAPS: 12.5000 mg | ORAL_CAPSULE | Freq: Every day | ORAL | 0 refills | Status: DC

## 2018-01-14 MED ORDER — ALBUTEROL SULFATE HFA 108 (90 BASE) MCG/ACT IN AERS: 1.0000 | INHALATION_SPRAY | Freq: Four times a day (QID) | RESPIRATORY_TRACT | 0 refills | Status: DC | PRN

## 2018-01-14 MED ORDER — ZIPRASIDONE HCL 20 MG PO CAPS: 20.0000 mg | ORAL_CAPSULE | Freq: Two times a day (BID) | ORAL | 0 refills | Status: DC

## 2018-01-14 MED ORDER — GABAPENTIN 400 MG PO CAPS: 400.0000 mg | ORAL_CAPSULE | Freq: Three times a day (TID) | ORAL | 0 refills | Status: DC

## 2018-01-14 MED ORDER — PROPRANOLOL HCL 10 MG PO TABS: 10.0000 mg | ORAL_TABLET | Freq: Two times a day (BID) | ORAL | 0 refills | Status: DC

## 2018-01-14 NOTE — Progress Notes (Signed)
Pt complains of pain from headache of 9/10 this morning.  She reports she slept well and states "I'm in a good mood, I think I'm ready to go home."  PRN medication administered for pain.  Will continue to monitor and assess.

## 2018-01-14 NOTE — Progress Notes (Signed)
  [facility] :  Will you be returning to the same living situation after discharge:  Yes,  with family  At discharge, do you have transportation home?: Yes,  family  Do you have the ability to pay for your medications: Yes,  insurance   Release of information consent forms completed and in the chart;  Patient's signature needed at discharge.  Patient to Follow up at: Follow-up Information    [facility] Go on 01/23/2018.   Specialty:  Behavioral Health Why:  Please attend your medication appt with Dr. Vanetta ShawlHisada on Thursday, 01/23/18, at 2pm.  Please attend your therapy appt with Florencia ReasonsPeggy Bynum on Tuesday, 01/28/18, at 10am. Contact information: [address] [place] [id number] [phone]          Next level of care provider [facility]:yes  Safety Planning and Suicide Prevention discussed: Yes,  with the patient   Have you used any form of tobacco in the last 30 days? (Cigarettes, Smokeless Tobacco, Cigars, and/or Pipes): No  Has patient been referred to the Quitline?: Patient refused referral  Patient has been referred for addiction treatment: Pt. refused referral  Maeola SarahJolan E Nguyen Butler, LCSWA 01/14/2018, 10:58 AM

## 2018-01-14 NOTE — [facility] Suicide Risk Assessment (Addendum)
[facility] Discharge Suicide Risk Assessment   Principal Problem: Severe recurrent major depression without psychotic features [facility]) Discharge Diagnoses:  Patient Active Problem List   Diagnosis Date Noted  . Severe recurrent major depression without psychotic features (HCC) [F33.2] 01/05/2018  . MDD (major depressive disorder), recurrent severe, without psychosis (HCC) [F33.2] 09/01/2017    Total Time spent with patient: 30 minutes  Musculoskeletal: Strength & Muscle Tone: within normal limits Gait & Station: normal Patient leans: N/A  Psychiatric Specialty Exam: ROS denies headache, denies chest pain, no shortness of breath, no vomiting   Blood pressure (!) 145/76, pulse (!) 115, temperature 97.7 F (36.5 C), temperature source Oral, resp. rate 18, height 5' 6.75" (1.695 m), weight (!) 142.9 kg (315 lb), SpO2 100 %.Body mass index is 49.71 kg/m.  General Appearance: improved grooming   Eye Contact::  Good  Speech:  Normal Rate409  Volume:  Normal  Mood:  states mood is improved, and describes mood as 8/10 today  Affect:  appropriate, smiles at times appropriatley   Thought Process:  Linear and Descriptions of Associations: Intact  Orientation:  Full (Time, Place, and Person)  Thought Content:  no hallucinations, no delusions, not internally preoccupied   Suicidal Thoughts:  No denies any suicidal ideations, denies any self injurious ideations, and is future oriented, denies homicidal ideations  Homicidal Thoughts:  No  Memory:  recent and remote grossly intact   Judgement:  Other:  fair- improving   Insight:  fair- improving   Psychomotor Activity:  Normal  Concentration:  Good  Recall:  Good  Fund of Knowledge:Good  Language: Good  Akathisia:  Negative  Handed:  Right  AIMS (if indicated):   no abnormal or involuntary movements noted or reported   Assets:  Communication Skills Desire for Improvement Resilience  Sleep:  Number of Hours: 6  Cognition: WNL  ADL's:  Intact    Mental Status Per Nursing Assessment::   On Admission:     Demographic Factors:  Divorced, no children, lives with roommate, employed   Loss Factors: Medication non compliance   Historical Factors: History of depression, history of suicide attempts, history of self burning, history of PTSD symptoms. Patient reports chronic history of intermittent self injurious ideation ( self cutting, burning )  Prior psychiatric admission last year  Risk Reduction Factors:   Employed, Living with another person, especially a relative and Positive coping skills or problem solving skills  Continued Clinical Symptoms:  At this time patient reports feeling better than she did at admission, describes improved mood , affect presents reactive, smiles at times appropriately, no thought disorder, no suicidal or self injurious ideations at this time, no homicidal ideations, no psychotic symptoms, future oriented, stating she plans to go church tomorrow and planning on returning to work next week, requesting letter for work. Denies medication side effects and states she feels they are helping significantly  We reviewed medication side effects, including movement disorders , metabolic disturbances on antipsychotic, and teratogenic effects of Lithium. We reviewed precautions related to sedation such as not driving if sedated  Li serum level low at 0.35. As patient reports good clinical response and no side effects, will not increase dose further at discharge- patient to follow up with outpatient psychiatrist and discuss further titration if indicated .   Cognitive Features That Contribute To Risk:  No gross cognitive deficits noted upon discharge. Is alert , attentive, and oriented x 3   Suicide Risk:  Mild:  Suicidal ideation of limited frequency,  intensity, duration, and specificity.  There are no identifiable plans, no associated intent, mild dysphoria and related symptoms, good self-control (both objective  and subjective assessment), few other risk factors, and identifiable protective factors, including available and accessible social support.  Follow-up Information    [facility]. Go on 01/23/2018.   Specialty:  Behavioral Health Why:  Please attend your medication appt with Dr. Vanetta ShawlHisada on Thursday, 01/23/18, at 2pm.  Please attend your therapy appt with Florencia ReasonsPeggy Bynum on Tuesday, 01/28/18, at 10am. Contact information: [address] [place] [id number] [phone]          Plan Of Care/Follow-up recommendations:  Activity:  as tolerated  Diet:  heart healthy Tests:  NA Other:  See below  Patient is requesting discharge and at this time there are no grounds for involuntary commitment  She plans to follow up as above  Plans to get a PCP for management of medical issues   Craige CottaFernando A Cobos, MD 01/14/2018, 10:51 AM

## 2018-01-14 NOTE — Progress Notes (Signed)
Pt d/c from the hospital. All items returned. D/C instructions given, prescriptions given and samples given. Pt denies si and hi. 
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
## 2018-01-18 ENCOUNTER — Encounter (HOSPITAL_COMMUNITY): Payer: Self-pay

## 2018-01-18 ENCOUNTER — Observation Stay (HOSPITAL_COMMUNITY)
Admission: EM | Admit: 2018-01-18 | Discharge: 2018-01-19 | Disposition: A | Discharge status: Home / Self Care | Payer: Self-pay | Attending: Internal Medicine | Admitting: Internal Medicine

## 2018-01-18 ENCOUNTER — Other Ambulatory Visit: Payer: Self-pay

## 2018-01-18 DIAGNOSIS — I10 Essential (primary) hypertension: Secondary | ICD-10-CM | POA: Insufficient documentation

## 2018-01-18 DIAGNOSIS — G9081 Serotonin syndrome: Secondary | ICD-10-CM

## 2018-01-18 DIAGNOSIS — R45851 Suicidal ideations: Secondary | ICD-10-CM | POA: Insufficient documentation

## 2018-01-18 DIAGNOSIS — F419 Anxiety disorder, unspecified: Secondary | ICD-10-CM | POA: Insufficient documentation

## 2018-01-18 DIAGNOSIS — G2579 Other drug induced movement disorders: Secondary | ICD-10-CM

## 2018-01-18 DIAGNOSIS — J45909 Unspecified asthma, uncomplicated: Secondary | ICD-10-CM | POA: Insufficient documentation

## 2018-01-18 DIAGNOSIS — F329 Major depressive disorder, single episode, unspecified: Secondary | ICD-10-CM | POA: Insufficient documentation

## 2018-01-18 DIAGNOSIS — R251 Tremor, unspecified: Secondary | ICD-10-CM | POA: Insufficient documentation

## 2018-01-18 DIAGNOSIS — Z6841 Body Mass Index (BMI) 40.0 and over, adult: Secondary | ICD-10-CM | POA: Insufficient documentation

## 2018-01-18 DIAGNOSIS — R000 Tachycardia, unspecified: Principal | ICD-10-CM | POA: Diagnosis present

## 2018-01-18 DIAGNOSIS — Z79899 Other long term (current) drug therapy: Secondary | ICD-10-CM | POA: Insufficient documentation

## 2018-01-18 HISTORY — DX: Unspecified asthma, uncomplicated: J45.909

## 2018-01-18 LAB — CBC
HCT: 38.1 % (ref 36.0–46.0)
HEMOGLOBIN: 12.7 g/dL (ref 12.0–15.0)
MCH: 26.6 pg (ref 26.0–34.0)
MCHC: 33.3 g/dL (ref 30.0–36.0)
MCV: 79.7 fL (ref 78.0–100.0)
PLATELETS: 374 10*3/uL (ref 150–400)
RBC: 4.78 MIL/uL (ref 3.87–5.11)
RDW: 14.1 % (ref 11.5–15.5)
WBC: 13.1 10*3/uL — ABNORMAL HIGH (ref 4.0–10.5)

## 2018-01-18 LAB — COMPREHENSIVE METABOLIC PANEL
ALT: 26 U/L (ref 14–54)
ANION GAP: 11 (ref 5–15)
AST: 19 U/L (ref 15–41)
Albumin: 3.4 g/dL — ABNORMAL LOW (ref 3.5–5.0)
Alkaline Phosphatase: 84 U/L (ref 38–126)
BUN: 12 mg/dL (ref 6–20)
CALCIUM: 9.1 mg/dL (ref 8.9–10.3)
CHLORIDE: 104 mmol/L (ref 101–111)
CO2: 20 mmol/L — AB (ref 22–32)
Creatinine, Ser: 0.72 mg/dL (ref 0.44–1.00)
GFR calc non Af Amer: >60 mL/min (ref >60)
Glucose, Bld: 103 mg/dL — ABNORMAL HIGH (ref 65–99)
Potassium: 3.9 mmol/L (ref 3.5–5.1)
SODIUM: 135 mmol/L (ref 135–145)
Total Bilirubin: 0.7 mg/dL (ref 0.3–1.2)
Total Protein: 8.2 g/dL — ABNORMAL HIGH (ref 6.5–8.1)

## 2018-01-18 LAB — I-STAT BETA HCG BLOOD, ED (MC, WL, AP ONLY)

## 2018-01-18 LAB — SALICYLATE LEVEL: Salicylate Lvl: <7 mg/dL (ref 2.8–30.0)

## 2018-01-18 LAB — ETHANOL: Alcohol, Ethyl (B): <10 mg/dL (ref <10)

## 2018-01-18 LAB — TSH: TSH: 2.572 u[IU]/mL (ref 0.350–4.500)

## 2018-01-18 LAB — T4, FREE: Free T4: 0.79 ng/dL (ref 0.61–1.12)

## 2018-01-18 LAB — CK: Total CK: 39 U/L (ref 38–234)

## 2018-01-18 LAB — LITHIUM LEVEL: Lithium Lvl: 0.2 mmol/L — ABNORMAL LOW (ref 0.60–1.20)

## 2018-01-18 LAB — ACETAMINOPHEN LEVEL

## 2018-01-18 MED ORDER — SODIUM CHLORIDE 0.9 % IV BOLUS (SEPSIS)
1000.0000 mL | Freq: Once | INTRAVENOUS | Status: AC
  Administered 2018-01-18: 1000 mL via INTRAVENOUS

## 2018-01-18 MED ORDER — METOPROLOL TARTRATE 5 MG/5ML IV SOLN
5.0000 mg | INTRAVENOUS | Status: AC
  Administered 2018-01-18: 5 mg via INTRAVENOUS
  Filled 2018-01-18: qty 5

## 2018-01-18 MED ORDER — ALBUTEROL SULFATE (2.5 MG/3ML) 0.083% IN NEBU: 3.0000 mL | INHALATION_SOLUTION | Freq: Four times a day (QID) | RESPIRATORY_TRACT | Status: DC | PRN

## 2018-01-18 MED ORDER — LORAZEPAM 0.5 MG PO TABS: 0.5000 mg | ORAL_TABLET | Freq: Two times a day (BID) | ORAL | Status: DC | PRN

## 2018-01-18 MED ORDER — ENOXAPARIN SODIUM 40 MG/0.4ML ~~LOC~~ SOLN
40.0000 mg | SUBCUTANEOUS | Status: DC
  Administered 2018-01-18: 40 mg via SUBCUTANEOUS
  Filled 2018-01-18: qty 0.4

## 2018-01-18 MED ORDER — SODIUM CHLORIDE 0.9 % IV SOLN
INTRAVENOUS | Status: DC
  Administered 2018-01-18: 23:00:00 via INTRAVENOUS

## 2018-01-18 MED ORDER — PROPRANOLOL HCL 10 MG PO TABS
10.0000 mg | ORAL_TABLET | Freq: Two times a day (BID) | ORAL | Status: DC
  Administered 2018-01-18 – 2018-01-19 (×2): 10 mg via ORAL
  Filled 2018-01-18 (×2): qty 1

## 2018-01-18 MED ORDER — METOPROLOL TARTRATE 5 MG/5ML IV SOLN: 2.5000 mg | INTRAVENOUS | Status: DC | PRN

## 2018-01-18 NOTE — ED Notes (Signed)
Pt got up to use restroom but then when they got to restroom they no longer felt the need to urinate.

## 2018-01-18 NOTE — ED Notes (Signed)
Patient aware we need urine, unable to provide specimen at this time. States she has not eaten or drank anything today

## 2018-01-18 NOTE — ED Notes (Signed)
ED TO INPATIENT HANDOFF REPORT  Name/Age/Gender Veronica Waters 27 y.o. female  Code Status Code Status History    Date Active Date Inactive Code Status Order ID Comments User Context   01/05/2018 22:45 01/14/2018 16:48 Full Code [id number]  Rozetta Nunnery, NP Inpatient   09/01/2017 15:41 09/06/2017 10:36 Full Code [id number]  Vicenta Aly, NP Inpatient   09/01/2017 07:21 09/01/2017 13:35 Full Code [id number]  Ezequiel Essex, MD ED      Home/SNF/Other Home  Chief Complaint medical clearance  Level of Care/Admitting Diagnosis ED Disposition    ED Disposition Condition [facility] Area: [facility] [[id number]]  Level of Care: Telemetry [5]  Admit to tele based on following criteria: Complex arrhythmia (Bradycardia/Tachycardia)  Diagnosis: Tachycardia [628366]  Admitting Physician: Florencia Reasons [[id number]]  Attending Physician: Florencia Reasons [[id number]]  PT Class (Do Not Modify): Observation [104]  PT Acc Code (Do Not Modify): Observation [10022]       Medical History Past Medical History:  Diagnosis Date  . Hypertension   . Seizure ([place])     Allergies Allergies  Allergen Reactions  . Aspirin Hives  . Cranberry Hives  . Vicodin [Hydrocodone-Acetaminophen] Hives    IV Location/Drains/Wounds Patient Lines/Drains/Airways Status   Active Line/Drains/Airways    Name:   Placement date:   Placement time:   Site:   Days:   Peripheral IV 01/18/18 Left Forearm   01/18/18    1831    Forearm   less than 1          Labs/Imaging Results for orders placed or performed during the hospital encounter of 01/18/18 (from the past 48 hour(s))  Comprehensive metabolic panel     Status: Abnormal   Collection Time: 01/18/18  2:49 PM  Result Value Ref Range   Sodium 135 135 - 145 mmol/L   Potassium 3.9 3.5 - 5.1 mmol/L   Chloride 104 101 - 111 mmol/L   CO2 20 (L) 22 - 32 mmol/L   Glucose, Bld 103 (H) 65 - 99 mg/dL   BUN 12 6 - 20 mg/dL   Creatinine, Ser 0.72 0.44  - 1.00 mg/dL   Calcium 9.1 8.9 - 10.3 mg/dL   Total Protein 8.2 (H) 6.5 - 8.1 g/dL   Albumin 3.4 (L) 3.5 - 5.0 g/dL   AST 19 15 - 41 U/L   ALT 26 14 - 54 U/L   Alkaline Phosphatase 84 38 - 126 U/L   Total Bilirubin 0.7 0.3 - 1.2 mg/dL   GFR calc non Af Amer >60 >60 mL/min   GFR calc Af Amer >60 >60 mL/min    Comment: (NOTE) The eGFR has been calculated using the CKD EPI equation. This calculation has not been validated in all clinical situations. eGFR's persistently <60 mL/min signify possible Chronic Kidney Disease.    Anion gap 11 5 - 15  Ethanol     Status: None   Collection Time: 01/18/18  2:49 PM  Result Value Ref Range   Alcohol, Ethyl (B) <10 <10 mg/dL    Comment:        LOWEST DETECTABLE LIMIT FOR SERUM ALCOHOL IS 10 mg/dL FOR MEDICAL PURPOSES ONLY   Salicylate level     Status: None   Collection Time: 01/18/18  2:49 PM  Result Value Ref Range   Salicylate Lvl <5.6 2.8 - 30.0 mg/dL  Acetaminophen level     Status: Abnormal   Collection Time: 01/18/18  2:49 PM  Result Value Ref  Range   Acetaminophen (Tylenol), Serum <10 (L) 10 - 30 ug/mL    Comment:        THERAPEUTIC CONCENTRATIONS VARY SIGNIFICANTLY. A RANGE OF 10-30 ug/mL MAY BE AN EFFECTIVE CONCENTRATION FOR MANY PATIENTS. HOWEVER, SOME ARE BEST TREATED AT CONCENTRATIONS OUTSIDE THIS RANGE. ACETAMINOPHEN CONCENTRATIONS >150 ug/mL AT 4 HOURS AFTER INGESTION AND >50 ug/mL AT 12 HOURS AFTER INGESTION ARE OFTEN ASSOCIATED WITH TOXIC REACTIONS.   cbc     Status: Abnormal   Collection Time: 01/18/18  2:49 PM  Result Value Ref Range   WBC 13.1 (H) 4.0 - 10.5 K/uL   RBC 4.78 3.87 - 5.11 MIL/uL   Hemoglobin 12.7 12.0 - 15.0 g/dL   HCT 38.1 36.0 - 46.0 %   MCV 79.7 78.0 - 100.0 fL   MCH 26.6 26.0 - 34.0 pg   MCHC 33.3 30.0 - 36.0 g/dL   RDW 14.1 11.5 - 15.5 %   Platelets 374 150 - 400 K/uL  I-Stat beta hCG blood, ED     Status: None   Collection Time: 01/18/18  3:02 PM  Result Value Ref Range   I-stat  hCG, quantitative <5.0 <5 mIU/mL   Comment 3            Comment:   GEST. AGE      CONC.  (mIU/mL)   <=1 WEEK        5 - 50     2 WEEKS       50 - 500     3 WEEKS       100 - 10,000     4 WEEKS     1,000 - 30,000        FEMALE AND NON-PREGNANT FEMALE:     LESS THAN 5 mIU/mL   Lithium level     Status: Abnormal   Collection Time: 01/18/18  4:10 PM  Result Value Ref Range   Lithium Lvl 0.20 (L) 0.60 - 1.20 mmol/L  TSH     Status: None   Collection Time: 01/18/18  4:10 PM  Result Value Ref Range   TSH 2.572 0.350 - 4.500 uIU/mL    Comment: Performed by a 3rd Generation assay with a functional sensitivity of <=0.01 uIU/mL.  T4, free     Status: None   Collection Time: 01/18/18  4:10 PM  Result Value Ref Range   Free T4 0.79 0.61 - 1.12 ng/dL    Comment: (NOTE) Biotin ingestion may interfere with free T4 tests. If the results are inconsistent with the TSH level, previous test results, or the clinical presentation, then consider biotin interference. If needed, order repeat testing after stopping biotin. Performed at [facility], [place] [address] [place], [place] [address]   CK     Status: None   Collection Time: 01/18/18  4:10 PM  Result Value Ref Range   Total CK 39 38 - 234 U/L   No results found.  Pending Labs Unresulted Labs (From admission, onward)   Start     Ordered   01/18/18 1610  T3, free  STAT,   STAT     01/18/18 1609   01/18/18 1421  Rapid urine drug screen (hospital performed)  Once,   STAT     01/18/18 1421   Signed and Held  HIV antibody (Routine Testing)  Once,   R     Signed and Held   Signed and Held  CBC  (enoxaparin (LOVENOX)    CrCl >/= 30  ml/min)  Once,   R    Comments:  Baseline for enoxaparin therapy IF NOT ALREADY DRAWN.  Notify MD if PLT < 100 K.    Signed and Held   Signed and Held  Creatinine, serum  (enoxaparin (LOVENOX)    CrCl >/= 30 ml/min)  Once,   R    Comments:  Baseline for enoxaparin therapy IF NOT ALREADY DRAWN.    Signed and  Held   Signed and Held  Creatinine, serum  (enoxaparin (LOVENOX)    CrCl >/= 30 ml/min)  Weekly,   R    Comments:  while on enoxaparin therapy    Signed and Held      Vitals/Pain Today's Vitals   01/18/18 1801 01/18/18 1830 01/18/18 1902 01/18/18 1932  BP: 133/90 (!) 137/109 119/76 119/69  Pulse: (!) 128 (!) 123 99 (!) 106  Resp: (!) 25 (!) 28 (!) 21 18  Temp: 98.3 F (36.8 C)  98.1 F (36.7 C) 97.8 F (36.6 C)  TempSrc: Oral  Oral Oral  SpO2: 100% 98% 99% 99%  Weight:      Height:        Isolation Precautions No active isolations  Medications Medications  0.9 %  sodium chloride infusion (not administered)  albuterol (PROVENTIL) (2.5 MG/3ML) 0.083% nebulizer solution 3 mL (not administered)  LORazepam (ATIVAN) tablet 0.5 mg (not administered)  propranolol (INDERAL) tablet 10 mg (not administered)  sodium chloride 0.9 % bolus 1,000 mL (1,000 mLs Intravenous New Bag/Given 01/18/18 1829)  metoprolol tartrate (LOPRESSOR) injection 5 mg (5 mg Intravenous Given 01/18/18 1829)    Mobility walks

## 2018-01-18 NOTE — ED Triage Notes (Signed)
[facility] last Tuesday with new meds, started to feel SI with occ thought of harming self, pt states she called them and they told her to come here. Pt unsure witch meds may be causing this feeling.

## 2018-01-18 NOTE — ED Notes (Signed)
Patient's belongings in cabinet above computer 16-18

## 2018-01-18 NOTE — H&P (Signed)
History and Physical  [name] ZOX:[id number] DOB: Mar 28, 1991 DOA: 01/18/2018  Referring physician: EDP PCP: Patient, No Pcp Per   Chief Complaint: Tachycardia, tremors, SI  HPI: Veronica Waters is a 27 y.o. female   History of sec hypertension, seizure, anxiety depression presented to emergency room with above complaints. She reports she ran out of all her home medication 3 weeks ago, she self reported to behavioral health hospital and was hospitalized.  She was started on lithium and Geodon and was discharged from behavioral health a week ago.  She reports since returning home she started to feel jittery, hot and tremorous.  She presented to [facility] ED last night.  She was released to home without additional intervention.  Return to [facility] today with similar symptoms.   ED course: She has sinus tach tachycardia range from 116-128.  Blood pressure initially elevated 162/101.  She is tachypneic respiration rate 38.  No fever.  No Confusion.  She reports feeling hot, no visible diaphoresis.  No pupil dilation.  No diarrhea.  Labs WBC 13.  Otherwise unremarkable.  Hospitalist called due to concern of serotonin syndrome.   Patient also expressed suicidal thoughts, sitter in room.  Review of Systems:  Detail per HPI, Review of systems are otherwise negative And medicine Past Medical History:  Diagnosis Date  . Hypertension   . Seizure [facility])    Past Surgical History:  Procedure Laterality Date  . TUBAL LIGATION     Social History:  reports that  has never smoked. she has never used smokeless tobacco. She reports that she does not drink alcohol or use drugs. Patient lives at home & is able to participate in activities of daily living independently.  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   Allergies  Allergen Reactions  . Aspirin Hives  . Cranberry Hives  . Vicodin [Hydrocodone-Acetaminophen] Hives    No family history on file.    Prior to  Admission medications   Medication Sig Start Date End Date Taking? Authorizing Provider  albuterol (PROVENTIL HFA;VENTOLIN HFA) 108 (90 Base) MCG/ACT inhaler Inhale 1-2 puffs into the lungs every 6 (six) hours as needed for wheezing or shortness of breath. 01/14/18  Yes Money, Gerlene Burdock, FNP  amitriptyline (ELAVIL) 50 MG tablet Take 1 tablet (50 mg total) by mouth at bedtime as needed for sleep. 01/14/18  Yes Money, Gerlene Burdock, FNP  FLUoxetine (PROZAC) 20 MG capsule Take 3 capsules (60 mg total) by mouth daily. For mood control 01/15/18  Yes Money, Gerlene Burdock, FNP  gabapentin (NEURONTIN) 400 MG capsule Take 1 capsule (400 mg total) by mouth 3 (three) times daily. For pain and agitation 01/14/18  Yes Money, Gerlene Burdock, FNP  hydrochlorothiazide (MICROZIDE) 12.5 MG capsule Take 1 capsule (12.5 mg total) by mouth daily. For high blood pressure 01/15/18  Yes Money, Gerlene Burdock, FNP  hydrOXYzine (ATARAX/VISTARIL) 25 MG tablet Take 1 tablet (25 mg total) by mouth 3 (three) times daily as needed for anxiety. 01/14/18  Yes Money, Gerlene Burdock, FNP  lisinopril (PRINIVIL,ZESTRIL) 20 MG tablet Take 1 tablet (20 mg total) by mouth daily. For high blood pressure 01/15/18  Yes Money, Gerlene Burdock, FNP  lithium carbonate 600 MG capsule Take 1 capsule (600 mg total) by mouth at bedtime. 01/14/18  Yes Money, Gerlene Burdock, FNP  LORazepam (ATIVAN) 0.5 MG tablet Take 1 tablet (0.5 mg total) by mouth 2 (two) times daily as needed for anxiety. Patient taking differently: Take 0.5 mg  by mouth every 8 (eight) hours as needed for anxiety.  01/14/18  Yes Money, Gerlene Burdockravis B, FNP  propranolol (INDERAL) 10 MG tablet Take 1 tablet (10 mg total) by mouth 2 (two) times daily. 01/14/18  Yes Money, Gerlene Burdockravis B, FNP  ziprasidone (GEODON) 20 MG capsule Take 1 capsule (20 mg total) by mouth 2 (two) times daily with a meal. For mood control 01/14/18  Yes Money, Gerlene Burdockravis B, FNP    Physical Exam: BP (!) 150/100 (BP Location: Right Arm)   Pulse (!) 127   Temp 98 F (36.7 C)  (Oral)   Resp (!) 24   Ht 5\' 7"  (1.702 m)   Wt (!) 141.1 kg (311 lb)   SpO2 99%   BMI 48.71 kg/m   General:  NAD, slightly tremorous  Eyes: PERRL ENT: unremarkable Neck: supple, no JVD Cardiovascular: sinus tachycardia Respiratory: CTABL Abdomen: soft/ND/ND, positive bowel sounds Skin: no rash Musculoskeletal:  No edema Psychiatric: calm/cooperative Neurologic: no focal findings            Labs on Admission:  Basic Metabolic Panel: Recent Labs  Lab 01/18/18 1449  NA 135  K 3.9  CL 104  CO2 20*  GLUCOSE 103*  BUN 12  CREATININE 0.72  CALCIUM 9.1   Liver Function Tests: Recent Labs  Lab 01/18/18 1449  AST 19  ALT 26  ALKPHOS 84  BILITOT 0.7  PROT 8.2*  ALBUMIN 3.4*   No results for input(s): LIPASE, AMYLASE in the last 168 hours. No results for input(s): AMMONIA in the last 168 hours. CBC: Recent Labs  Lab 01/18/18 1449  WBC 13.1*  HGB 12.7  HCT 38.1  MCV 79.7  PLT 374   Cardiac Enzymes: No results for input(s): CKTOTAL, CKMB, CKMBINDEX, TROPONINI in the last 168 hours.  BNP (last 3 results) No results for input(s): BNP in the last 8760 hours.  ProBNP (last 3 results) No results for input(s): PROBNP in the last 8760 hours.  CBG: No results for input(s): GLUCAP in the last 168 hours.  Radiological Exams on Admission: No results found.    Assessment/Plan Present on Admission: **None**  Tachycardia tremorous TSH unremarkable EDP request admission due to concern for serotonin syndrome. We will get a stat EKG, order IV fluids, start beta-blocker UDS pending collection. Hold all antipsychotics.  Continue as needed Ativan  Hypertension, continue home meds propranolol.  Hold lisinopril for now   Suicidal ideation, sitter in room. Psych consult.   Morbid obesity Body mass index is 48.71 kg/m.  Lifestyle modification  DVT prophylaxis: Lovenox  Consultants: Psychiatry  Code Status: full   Family Communication:  Patient    Disposition Plan: Med telemetry obs  Time spent: 60mins  Albertine GratesFang Christon Parada MD, PhD [facility] Pager [phone] If 7PM-7AM, please contact night-coverage at [email or web address], password [email or web address]

## 2018-01-18 NOTE — ED Provider Notes (Signed)
[facility] Provider Note   CSN: [id number] Arrival date & time: 01/18/18  1342     History   Chief Complaint Chief Complaint  Patient presents with  . Medical Clearance    HPI Veronica Waters is a 27 y.o. female who who presents the emergency department with chief complaint of feeling tremulous and suicidal.  The [facility] this past Tuesday on 01/14/2018.  She had lithium and Geodon added to her medication list.  Patient states that since that time she has felt extremely tremulous and because she feels her tremulous is making her feel a little bit suicidal.  She denies an actual plan. She has been having difficulty he sleeping as well.  She denies changes in vision.  She states that she feels extremely hot and flushed and has a dry mouth but denies urinary retention symptoms.  She denies ataxia or other neurologic symptoms. HPI  Past Medical History:  Diagnosis Date  . Asthma   . Hypertension     Patient Active Problem List   Diagnosis Date Noted  . Tachycardia 01/18/2018  . Severe recurrent major depression without psychotic features (HCC) 01/05/2018  . MDD (major depressive disorder), recurrent severe, without psychosis (HCC) 09/01/2017    No past surgical history on file.  OB History    No data available       Home Medications    Prior to Admission medications   Medication Sig Start Date End Date Taking? Authorizing Provider  albuterol (PROVENTIL HFA;VENTOLIN HFA) 108 (90 Base) MCG/ACT inhaler Inhale 1-2 puffs into the lungs every 6 (six) hours as needed for wheezing or shortness of breath. 01/14/18  Yes Money, Gerlene Burdockravis B, FNP  amitriptyline (ELAVIL) 50 MG tablet Take 1 tablet (50 mg total) by mouth at bedtime as needed for sleep. 01/14/18  Yes Money, Gerlene Burdockravis B, FNP  FLUoxetine (PROZAC) 20 MG capsule Take 3 capsules (60 mg total) by mouth daily. For mood control 01/15/18  Yes Money,  Gerlene Burdockravis B, FNP  gabapentin (NEURONTIN) 400 MG capsule Take 1 capsule (400 mg total) by mouth 3 (three) times daily. For pain and agitation 01/14/18  Yes Money, Gerlene Burdockravis B, FNP  hydrochlorothiazide (MICROZIDE) 12.5 MG capsule Take 1 capsule (12.5 mg total) by mouth daily. For high blood pressure 01/15/18  Yes Money, Gerlene Burdockravis B, FNP  hydrOXYzine (ATARAX/VISTARIL) 25 MG tablet Take 1 tablet (25 mg total) by mouth 3 (three) times daily as needed for anxiety. 01/14/18  Yes Money, Gerlene Burdockravis B, FNP  lisinopril (PRINIVIL,ZESTRIL) 20 MG tablet Take 1 tablet (20 mg total) by mouth daily. For high blood pressure 01/15/18  Yes Money, Gerlene Burdockravis B, FNP  lithium carbonate 600 MG capsule Take 1 capsule (600 mg total) by mouth at bedtime. 01/14/18  Yes Money, Gerlene Burdockravis B, FNP  LORazepam (ATIVAN) 0.5 MG tablet Take 1 tablet (0.5 mg total) by mouth 2 (two) times daily as needed for anxiety. Patient taking differently: Take 0.5 mg by mouth every 8 (eight) hours as needed for anxiety.  01/14/18  Yes Money, Gerlene Burdockravis B, FNP  propranolol (INDERAL) 10 MG tablet Take 1 tablet (10 mg total) by mouth 2 (two) times daily. 01/14/18  Yes Money, Gerlene Burdockravis B, FNP  ziprasidone (GEODON) 20 MG capsule Take 1 capsule (20 mg total) by mouth 2 (two) times daily with a meal. For mood control 01/14/18  Yes Money, Gerlene Burdockravis B, FNP    Family History No family history on file.  Social History Social  History   Tobacco Use  . Smoking status: Never Smoker  . Smokeless tobacco: Never Used  Substance Use Topics  . Alcohol use: No  . Drug use: No     Allergies   Cranberry; Aspirin; and Vicodin [hydrocodone-acetaminophen]   Review of Systems Review of Systems  Ten systems reviewed and are negative for acute change, except as noted in the HPI.   Physical Exam Updated Vital Signs BP (!) 149/97 (BP Location: Right Arm)   Pulse (!) 113   Temp (!) 97.5 F (36.4 C) (Oral)   Resp 20   Ht 5\' 7"  (1.702 m)   Wt (!) 141.1 kg (311 lb)   SpO2 99%   BMI 48.71  kg/m   Physical Exam  Constitutional: She is oriented to person, place, and time. She appears well-developed and well-nourished. No distress.  HENT:  Head: Normocephalic and atraumatic.  Eyes: Conjunctivae are normal. No scleral icterus.  Neck: Normal range of motion.  Cardiovascular: Regular rhythm and normal heart sounds. Exam reveals no gallop and no friction rub.  No murmur heard. Tachycardia  Pulmonary/Chest: Effort normal and breath sounds normal. No respiratory distress.  Abdominal: Soft. Bowel sounds are normal. She exhibits no distension and no mass. There is no tenderness. There is no guarding.  Neurological: She is alert and oriented to person, place, and time.  Tremulous, Clonus in the ankles Hyperreflexia  Skin: Skin is warm and dry. She is not diaphoretic.  Psychiatric: Her behavior is normal.  Nursing note and vitals reviewed.    ED Treatments / Results  Labs (all labs ordered are listed, but only abnormal results are displayed) Labs Reviewed  COMPREHENSIVE METABOLIC PANEL - Abnormal; Notable for the following components:      Result Value   CO2 20 (*)    Glucose, Bld 103 (*)    Total Protein 8.2 (*)    Albumin 3.4 (*)    All other components within normal limits  ACETAMINOPHEN LEVEL - Abnormal; Notable for the following components:   Acetaminophen (Tylenol), Serum <10 (*)    All other components within normal limits  CBC - Abnormal; Notable for the following components:   WBC 13.1 (*)    All other components within normal limits  LITHIUM LEVEL - Abnormal; Notable for the following components:   Lithium Lvl 0.20 (*)    All other components within normal limits  ETHANOL  SALICYLATE LEVEL  TSH  T4, FREE  CK  RAPID URINE DRUG SCREEN, HOSP PERFORMED  T3, FREE  HIV ANTIBODY (ROUTINE TESTING)  I-STAT BETA HCG BLOOD, ED ([facility], [facility], [facility] ONLY)    EKG  EKG Interpretation  Date/Time:  Saturday January 18 2018 15:23:51 EST Ventricular Rate:  96 PR  Interval:    QRS Duration: 97 QT Interval:  362 QTC Calculation: 458 R Axis:   53 Text Interpretation:  Sinus rhythm no prior ECG for comparison.  No STEMI Confirmed by Theda Belfast ([id number]) on 01/18/2018 4:35:56 PM       Radiology No results found.  Procedures .Critical Care Performed by: Arthor Captain, PA-C Authorized by: Arthor Captain, PA-C   Critical care provider statement:    Critical care time (minutes):  50   Critical care was necessary to treat or prevent imminent or life-threatening deterioration of the following conditions:  Toxidrome   Critical care was time spent personally by me on the following activities:  Review of old charts, re-evaluation of patient's condition, pulse oximetry, ordering and review of laboratory studies,  ordering and review of radiographic studies, ordering and performing treatments and interventions, obtaining history from patient or surrogate, interpretation of cardiac output measurements, examination of patient, evaluation of patient's response to treatment, discussions with consultants and development of treatment plan with patient or surrogate   (including critical care time)  Medications Ordered in ED Medications  0.9 %  sodium chloride infusion ( Intravenous New Bag/Given 01/18/18 2258)  albuterol (PROVENTIL) (2.5 MG/3ML) 0.083% nebulizer solution 3 mL (not administered)  LORazepam (ATIVAN) tablet 0.5 mg (not administered)  propranolol (INDERAL) tablet 10 mg (10 mg Oral Given 01/18/18 2257)  enoxaparin (LOVENOX) injection 40 mg (40 mg Subcutaneous Given 01/18/18 2257)  metoprolol tartrate (LOPRESSOR) injection 2.5 mg (not administered)  sodium chloride 0.9 % bolus 1,000 mL (0 mLs Intravenous Stopped 01/18/18 2031)  metoprolol tartrate (LOPRESSOR) injection 5 mg (5 mg Intravenous Given 01/18/18 1829)     Initial Impression / Assessment and Plan / ED Course  I have reviewed the triage vital signs and the nursing notes.  Pertinent labs &  imaging results that were available during my care of the patient were reviewed by me and considered in my medical decision making (see chart for details).  Clinical Course as of Jan 19 2344  Sat Jan 18, 2018  1648 Patient tachycardia, hypertension, on a beta-blocker which she took this morning.  I did a medication review with her pharmacist which should that she up her Prozac level while inpatient at behavioral health.  Given the fact that she is on a multitude of SSRIs has increased the dose I believe she has serotonin syndrome.  On repeat examination the patient has mild clonus in both ankles.  Further giving evidence to this diagnosis.  I discussed the findings with the patient and feel she needs admission for serotonin syndrome.  [AH]    Clinical Course User Index [AH] Arthor Captain, PA-C  EKG reviewed without sig abnormality.  Final Clinical Impressions(s) / ED Diagnoses   Final diagnoses:  Serotonin syndrome    ED Discharge Orders    None       Arthor Captain, PA-C 01/18/18 2346    Lorre Nick, MD 01/19/18 1005

## 2018-01-19 ENCOUNTER — Encounter (HOSPITAL_COMMUNITY): Payer: Self-pay

## 2018-01-19 DIAGNOSIS — F332 Major depressive disorder, recurrent severe without psychotic features: Secondary | ICD-10-CM

## 2018-01-19 DIAGNOSIS — R45851 Suicidal ideations: Secondary | ICD-10-CM

## 2018-01-19 DIAGNOSIS — R251 Tremor, unspecified: Secondary | ICD-10-CM

## 2018-01-19 LAB — CBC WITH DIFFERENTIAL/PLATELET
BASOS ABS: 0.1 10*3/uL (ref 0.0–0.1)
Basophils Relative: 1 %
EOS ABS: 0.2 10*3/uL (ref 0.0–0.7)
EOS PCT: 2 %
HCT: 35.3 % — ABNORMAL LOW (ref 36.0–46.0)
Hemoglobin: 11.5 g/dL — ABNORMAL LOW (ref 12.0–15.0)
LYMPHS PCT: 21 %
Lymphs Abs: 2 10*3/uL (ref 0.7–4.0)
MCH: 26.3 pg (ref 26.0–34.0)
MCHC: 32.6 g/dL (ref 30.0–36.0)
MCV: 80.6 fL (ref 78.0–100.0)
Monocytes Absolute: 0.6 10*3/uL (ref 0.1–1.0)
Monocytes Relative: 6 %
Neutro Abs: 6.7 10*3/uL (ref 1.7–7.7)
Neutrophils Relative %: 70 %
PLATELETS: 333 10*3/uL (ref 150–400)
RBC: 4.38 MIL/uL (ref 3.87–5.11)
RDW: 14.1 % (ref 11.5–15.5)
WBC: 9.5 10*3/uL (ref 4.0–10.5)

## 2018-01-19 LAB — COMPREHENSIVE METABOLIC PANEL
ALT: 22 U/L (ref 14–54)
AST: 16 U/L (ref 15–41)
Albumin: 3.4 g/dL — ABNORMAL LOW (ref 3.5–5.0)
Alkaline Phosphatase: 80 U/L (ref 38–126)
Anion gap: 6 (ref 5–15)
BUN: 12 mg/dL (ref 6–20)
CHLORIDE: 109 mmol/L (ref 101–111)
CO2: 22 mmol/L (ref 22–32)
CREATININE: 0.72 mg/dL (ref 0.44–1.00)
Calcium: 8.7 mg/dL — ABNORMAL LOW (ref 8.9–10.3)
GFR calc Af Amer: >60 mL/min (ref >60)
GFR calc non Af Amer: >60 mL/min (ref >60)
Glucose, Bld: 101 mg/dL — ABNORMAL HIGH (ref 65–99)
Potassium: 3.8 mmol/L (ref 3.5–5.1)
SODIUM: 137 mmol/L (ref 135–145)
Total Bilirubin: 0.6 mg/dL (ref 0.3–1.2)
Total Protein: 7.7 g/dL (ref 6.5–8.1)

## 2018-01-19 LAB — MAGNESIUM: Magnesium: 2 mg/dL (ref 1.7–2.4)

## 2018-01-19 LAB — HIV ANTIBODY (ROUTINE TESTING W REFLEX): HIV Screen 4th Generation wRfx: NONREACTIVE

## 2018-01-19 NOTE — Discharge Summary (Signed)
Discharge Summary  Veronica Waters ZOX:[id number] DOB: 02-27-1991  PCP: Patient, No Pcp Per  Admit date: 01/18/2018 Discharge date: 01/19/2018  Time spent: >12mins, case discussed with psychiatry Dr Lenore Cordia  Recommendations for Outpatient Follow-up:  1. F/u with PMD within a week  for hospital discharge follow up, repeat cbc/bmp at follow up 2. F/u with behavioral health as listed below:  [facility]. Go on 01/23/2018.   Specialty:  Behavioral Health Why:  Please attend your medication appt with Dr. Vanetta Shawl on Thursday, 01/23/18, at 2pm.  Please attend your therapy appt with Florencia Reasons on Tuesday, 01/28/18, at 10am. Contact information: [address] [place] [address] [phone]     Discharge Diagnoses:  Active Hospital Problems   Diagnosis Date Noted  . Tachycardia 01/18/2018    Resolved Hospital Problems  No resolved problems to display.    Discharge Condition: stable  Diet recommendation: heart healthy  Filed Weights   01/18/18 1419  Weight: (!) 141.1 kg (311 lb)    History of present illness:  PCP: Patient, No Pcp Per   Chief Complaint: Tachycardia, tremors, SI  HPI: Veronica Waters is a 27 y.o. female   History of sec hypertension, seizure, anxiety depression presented to emergency room with above complaints. She reports she ran out of all her home medication 3 weeks ago, she self reported to behavioral health hospital and was hospitalized.  She was started on lithium and Geodon and was discharged from behavioral health a week ago.  She reports since returning home she started to feel jittery, hot and tremorous.  She presented to [facility] ED last night.  She was released to home without additional intervention.  Return to [facility] today with similar symptoms.   ED course: She has sinus tach tachycardia range from 116-128.  Blood pressure initially elevated 162/101.  She is tachypneic  respiration rate 38.  No fever.  No Confusion.  She reports feeling hot, no visible diaphoresis.  No pupil dilation.  No diarrhea.  Labs WBC 13.  Otherwise unremarkable.  Hospitalist called due to concern of serotonin syndrome.   Patient also expressed suicidal thoughts, sitter in room.    Hospital Course:  Principal Problem:   Tachycardia   Tachycardia tremorous TSH unremarkable EDP request admission due to concern for serotonin syndrome.  EKG Qtc 490 She received IV fluids,  beta-blocker,  Hold all antipsychotics.  Continue as needed Ativan.  Symptom has resolved. UDS ordered but not collected, patient denies illicit drug use.   Case discussed with psychiatry Dr Lenore Cordia over the phone who advise stop all psych meds for now, patient is to follow up with behavioral health on 1/31 to restart psych meds on outpatient basis. Patient is to close follow up with behavioral health on 1/31 and 2/5 as scheduled.   Suicidal ideation, sitter in room. Psych consulted. She is cleared to discharge home from psychiatry. She is to close follow up with outpatient psych.   Hypertension, continue home meds propranolol.  Hold lisinopril for now Follow up with pmd to decide whether to resume lisinopril.  Morbid obesity Body mass index is 48.71 kg/m.  Lifestyle modification  DVT prophylaxis while in the hospital : Lovenox  Consultants: Psychiatry  Code Status: full   Family Communication:  Patient   Disposition Plan: home    Procedures:  none   Discharge Exam: BP 135/78   Pulse 88   Temp 97.9 F (36.6 C) (Oral)   Resp 20  Ht 5\' 7"  (1.702 m)   Wt (!) 141.1 kg (311 lb)   SpO2 99%   BMI 48.71 kg/m   General: NAD, obese Cardiovascular: RRR Respiratory: CTABL  Discharge Instructions You were cared for by a hospitalist during your hospital stay. If you have any questions about your discharge medications or the care you received while you were in the hospital after  you are discharged, you can call the unit and asked to speak with the hospitalist on call if the hospitalist that took care of you is not available. Once you are discharged, your primary care physician will handle any further medical issues. Please note that NO REFILLS for any discharge medications will be authorized once you are discharged, as it is imperative that you return to your primary care physician (or establish a relationship with a primary care physician if you do not have one) for your aftercare needs so that they can reassess your need for medications and monitor your lab values.  Discharge Instructions    Diet - low sodium heart healthy   Complete by:  As directed    Increase activity slowly   Complete by:  As directed      Allergies as of 01/19/2018      Reactions   Cranberry Hives, Swelling   Aspirin Hives   Vicodin [hydrocodone-acetaminophen] Hives      Medication List    STOP taking these medications   amitriptyline 50 MG tablet Commonly known as:  ELAVIL   FLUoxetine 20 MG capsule Commonly known as:  PROZAC   gabapentin 400 MG capsule Commonly known as:  NEURONTIN   hydrochlorothiazide 12.5 MG capsule Commonly known as:  MICROZIDE   hydrOXYzine 25 MG tablet Commonly known as:  ATARAX/VISTARIL   lisinopril 20 MG tablet Commonly known as:  PRINIVIL,ZESTRIL   lithium 600 MG capsule   LORazepam 0.5 MG tablet Commonly known as:  ATIVAN   ziprasidone 20 MG capsule Commonly known as:  GEODON     TAKE these medications   albuterol 108 (90 Base) MCG/ACT inhaler Commonly known as:  PROVENTIL HFA;VENTOLIN HFA Inhale 1-2 puffs into the lungs every 6 (six) hours as needed for wheezing or shortness of breath.   propranolol 10 MG tablet Commonly known as:  INDERAL Take 1 tablet (10 mg total) by mouth 2 (two) times daily.      Allergies  Allergen Reactions  . Cranberry Hives and Swelling  . Aspirin Hives  . Vicodin [Hydrocodone-Acetaminophen] Hives    Follow-up Information    [facility] Follow up.   Specialty:  Behavioral Health Why:   on 01/23/2018.  Specialty:  Behavioral Health Why:  Please attend your medication appt with Dr. Vanetta Shawl on Thursday, 01/23/18, at 2pm.  Please attend your therapy appt with Florencia Reasons on Tuesday, 2/5  at 10am. Contact information: [address] [place] [address] [phone]       please establish primary care doctor Follow up.        [facility] Follow up.   Why:  your can contact [facility] for primary care doctor  Contact information: [address] [place] [address] [phone]           The results of significant diagnostics from this hospitalization (including imaging, microbiology, ancillary and laboratory) are listed below for reference.    Significant Diagnostic Studies: No results found.  Microbiology: No results  found for this or any previous visit (from the past 240 hour(s)).   Labs: Basic Metabolic Panel: Recent Labs  Lab 01/18/18 1449 01/19/18 0857  NA 135 137  K 3.9 3.8  CL 104 109  CO2 20* 22  GLUCOSE 103* 101*  BUN 12 12  CREATININE 0.72 0.72  CALCIUM 9.1 8.7*  MG  --  2.0   Liver Function Tests: Recent Labs  Lab 01/18/18 1449 01/19/18 0857  AST 19 16  ALT 26 22  ALKPHOS 84 80  BILITOT 0.7 0.6  PROT 8.2* 7.7  ALBUMIN 3.4* 3.4*   No results for input(s): LIPASE, AMYLASE in the last 168 hours. No results for input(s): AMMONIA in the last 168 hours. CBC: Recent Labs  Lab 01/18/18 1449 01/19/18 0857  WBC 13.1* 9.5  NEUTROABS  --  6.7  HGB 12.7 11.5*  HCT 38.1 35.3*  MCV 79.7 80.6  PLT 374 333   Cardiac Enzymes: Recent Labs  Lab 01/18/18 1610  CKTOTAL 39   BNP: BNP (last 3 results) No results for input(s): BNP in the last 8760 hours.  ProBNP (last 3 results) No  results for input(s): PROBNP in the last 8760 hours.  CBG: No results for input(s): GLUCAP in the last 168 hours.     Signed:  Albertine GratesFang Tyde Lamison MD, PhD  [facility] 01/19/2018, 1:25 PM

## 2018-01-19 NOTE — Consult Note (Signed)
[facility] Consult   Reason for Consult:  Tachycardia, suicidal thought Referring Physician:  Dr. Erlinda Hong Patient Identification: Veronica Waters MRN:  [id number] Principal Diagnosis: Tachycardia Diagnosis:   Patient Active Problem List   Diagnosis Date Noted  . Tachycardia [R00.0] 01/18/2018  . Severe recurrent major depression without psychotic features ([place]) [F33.2] 01/05/2018  . MDD (major depressive disorder), recurrent severe, without psychosis ([place]) [F33.2] 09/01/2017    Total Time spent with patient: 45 minutes  Subjective:   Veronica Waters is a 27 y.o. female patient admitted with tachycardia and tremors.  HPI:  Patient reports history of depression and anxiety stated that she came to [facility] for evaluation of increased anxiety, bilateral hand tremors and increased heart rate She reports that she was having suicidal thoughts due to been overwhelmed with her symptoms. She was recently discharged from [facility] and schedule for outpatient treatment with a psychiatrist in [place], [place]. However, she reports since returning home she started to feel jittery, hot and tremulous.  She was taking to [facility] ED where she was evaluated and discharged. She returned to [facility] and was admitted to medical floor. Today, patient says she no longer having suicidal thoughts, feels anxious or tremulous and ready to be discharge. She will her psychiatric appointment scheduled for Jan 31st in [place].  Past Psychiatric History: as above  Risk to Self: Is patient at risk for suicide?: denies Risk to Others:   Prior Inpatient Therapy:   yes Prior Outpatient Therapy:  yes  Past Medical History:  Past Medical History:  Diagnosis Date  . Asthma   . Hypertension    History reviewed. No pertinent surgical history. Family History:  Family History  Family history unknown: Yes   Family Psychiatric  History:  Social History:  Social History   Substance and Sexual  Activity  Alcohol Use No     Social History   Substance and Sexual Activity  Drug Use No    Social History   Socioeconomic History  . Marital status: Single    Spouse name: None  . Number of children: None  . Years of education: None  . Highest education level: None  Social Needs  . Financial resource strain: None  . Food insecurity - worry: None  . Food insecurity - inability: None  . Transportation needs - medical: None  . Transportation needs - non-medical: None  Occupational History  . None  Tobacco Use  . Smoking status: Never Smoker  . Smokeless tobacco: Never Used  Substance and Sexual Activity  . Alcohol use: No  . Drug use: No  . Sexual activity: No    Birth control/protection: Other-see comments    Comment: Rod in left arm for birth control  Other Topics Concern  . None  Social History Narrative  . None   Additional Social History:    Allergies:   Allergies  Allergen Reactions  . Cranberry Hives and Swelling  . Aspirin Hives  . Vicodin [Hydrocodone-Acetaminophen] Hives    Labs:  Results for orders placed or performed during the hospital encounter of 01/18/18 (from the past 48 hour(s))  Comprehensive metabolic panel     Status: Abnormal   Collection Time: 01/18/18  2:49 PM  Result Value Ref Range   Sodium 135 135 - 145 mmol/L   Potassium 3.9 3.5 - 5.1 mmol/L   Chloride 104 101 - 111 mmol/L   CO2 20 (L) 22 - 32 mmol/L   Glucose, Bld 103 (H) 65 -  99 mg/dL   BUN 12 6 - 20 mg/dL   Creatinine, Ser 0.72 0.44 - 1.00 mg/dL   Calcium 9.1 8.9 - 10.3 mg/dL   Total Protein 8.2 (H) 6.5 - 8.1 g/dL   Albumin 3.4 (L) 3.5 - 5.0 g/dL   AST 19 15 - 41 U/L   ALT 26 14 - 54 U/L   Alkaline Phosphatase 84 38 - 126 U/L   Total Bilirubin 0.7 0.3 - 1.2 mg/dL   GFR calc non Af Amer >60 >60 mL/min   GFR calc Af Amer >60 >60 mL/min    Comment: (NOTE) The eGFR has been calculated using the CKD EPI equation. This calculation has not been validated in all clinical  situations. eGFR's persistently <60 mL/min signify possible Chronic Kidney Disease.    Anion gap 11 5 - 15  Ethanol     Status: None   Collection Time: 01/18/18  2:49 PM  Result Value Ref Range   Alcohol, Ethyl (B) <10 <10 mg/dL    Comment:        LOWEST DETECTABLE LIMIT FOR SERUM ALCOHOL IS 10 mg/dL FOR MEDICAL PURPOSES ONLY   Salicylate level     Status: None   Collection Time: 01/18/18  2:49 PM  Result Value Ref Range   Salicylate Lvl <3.6 2.8 - 30.0 mg/dL  Acetaminophen level     Status: Abnormal   Collection Time: 01/18/18  2:49 PM  Result Value Ref Range   Acetaminophen (Tylenol), Serum <10 (L) 10 - 30 ug/mL    Comment:        THERAPEUTIC CONCENTRATIONS VARY SIGNIFICANTLY. A RANGE OF 10-30 ug/mL MAY BE AN EFFECTIVE CONCENTRATION FOR MANY PATIENTS. HOWEVER, SOME ARE BEST TREATED AT CONCENTRATIONS OUTSIDE THIS RANGE. ACETAMINOPHEN CONCENTRATIONS >150 ug/mL AT 4 HOURS AFTER INGESTION AND >50 ug/mL AT 12 HOURS AFTER INGESTION ARE OFTEN ASSOCIATED WITH TOXIC REACTIONS.   cbc     Status: Abnormal   Collection Time: 01/18/18  2:49 PM  Result Value Ref Range   WBC 13.1 (H) 4.0 - 10.5 K/uL   RBC 4.78 3.87 - 5.11 MIL/uL   Hemoglobin 12.7 12.0 - 15.0 g/dL   HCT 38.1 36.0 - 46.0 %   MCV 79.7 78.0 - 100.0 fL   MCH 26.6 26.0 - 34.0 pg   MCHC 33.3 30.0 - 36.0 g/dL   RDW 14.1 11.5 - 15.5 %   Platelets 374 150 - 400 K/uL  I-Stat beta hCG blood, ED     Status: None   Collection Time: 01/18/18  3:02 PM  Result Value Ref Range   I-stat hCG, quantitative <5.0 <5 mIU/mL   Comment 3            Comment:   GEST. AGE      CONC.  (mIU/mL)   <=1 WEEK        5 - 50     2 WEEKS       50 - 500     3 WEEKS       100 - 10,000     4 WEEKS     1,000 - 30,000        FEMALE AND NON-PREGNANT FEMALE:     LESS THAN 5 mIU/mL   Lithium level     Status: Abnormal   Collection Time: 01/18/18  4:10 PM  Result Value Ref Range   Lithium Lvl 0.20 (L) 0.60 - 1.20 mmol/L  TSH     Status: None    Collection Time:  01/18/18  4:10 PM  Result Value Ref Range   TSH 2.572 0.350 - 4.500 uIU/mL    Comment: Performed by a 3rd Generation assay with a functional sensitivity of <=0.01 uIU/mL.  T4, free     Status: None   Collection Time: 01/18/18  4:10 PM  Result Value Ref Range   Free T4 0.79 0.61 - 1.12 ng/dL    Comment: (NOTE) Biotin ingestion may interfere with free T4 tests. If the results are inconsistent with the TSH level, previous test results, or the clinical presentation, then consider biotin interference. If needed, order repeat testing after stopping biotin. Performed at [facility], [place] [address] [place], [place] [address]   CK     Status: None   Collection Time: 01/18/18  4:10 PM  Result Value Ref Range   Total CK 39 38 - 234 U/L  CBC with Differential/Platelet     Status: Abnormal   Collection Time: 01/19/18  8:57 AM  Result Value Ref Range   WBC 9.5 4.0 - 10.5 K/uL   RBC 4.38 3.87 - 5.11 MIL/uL   Hemoglobin 11.5 (L) 12.0 - 15.0 g/dL   HCT 35.3 (L) 36.0 - 46.0 %   MCV 80.6 78.0 - 100.0 fL   MCH 26.3 26.0 - 34.0 pg   MCHC 32.6 30.0 - 36.0 g/dL   RDW 14.1 11.5 - 15.5 %   Platelets 333 150 - 400 K/uL   Neutrophils Relative % 70 %   Neutro Abs 6.7 1.7 - 7.7 K/uL   Lymphocytes Relative 21 %   Lymphs Abs 2.0 0.7 - 4.0 K/uL   Monocytes Relative 6 %   Monocytes Absolute 0.6 0.1 - 1.0 K/uL   Eosinophils Relative 2 %   Eosinophils Absolute 0.2 0.0 - 0.7 K/uL   Basophils Relative 1 %   Basophils Absolute 0.1 0.0 - 0.1 K/uL  Comprehensive metabolic panel     Status: Abnormal   Collection Time: 01/19/18  8:57 AM  Result Value Ref Range   Sodium 137 135 - 145 mmol/L   Potassium 3.8 3.5 - 5.1 mmol/L   Chloride 109 101 - 111 mmol/L   CO2 22 22 - 32 mmol/L   Glucose, Bld 101 (H) 65 - 99 mg/dL   BUN 12 6 - 20 mg/dL   Creatinine, Ser 0.72 0.44 - 1.00 mg/dL   Calcium 8.7 (L) 8.9 - 10.3 mg/dL   Total Protein 7.7 6.5 - 8.1 g/dL   Albumin 3.4 (L) 3.5 - 5.0 g/dL    AST 16 15 - 41 U/L   ALT 22 14 - 54 U/L   Alkaline Phosphatase 80 38 - 126 U/L   Total Bilirubin 0.6 0.3 - 1.2 mg/dL   GFR calc non Af Amer >60 >60 mL/min   GFR calc Af Amer >60 >60 mL/min    Comment: (NOTE) The eGFR has been calculated using the CKD EPI equation. This calculation has not been validated in all clinical situations. eGFR's persistently <60 mL/min signify possible Chronic Kidney Disease.    Anion gap 6 5 - 15  Magnesium     Status: None   Collection Time: 01/19/18  8:57 AM  Result Value Ref Range   Magnesium 2.0 1.7 - 2.4 mg/dL    Current Facility-Administered Medications  Medication Dose Route Frequency Provider Last Rate Last Dose  . 0.9 %  sodium chloride infusion   Intravenous Continuous Florencia Reasons, MD 75 mL/hr at 01/18/18 2258    . albuterol (PROVENTIL) (  2.5 MG/3ML) 0.083% nebulizer solution 3 mL  3 mL Inhalation Q6H PRN Florencia Reasons, MD      . enoxaparin (LOVENOX) injection 40 mg  40 mg Subcutaneous Q24H Florencia Reasons, MD   40 mg at 01/18/18 2257  . LORazepam (ATIVAN) tablet 0.5 mg  0.5 mg Oral BID PRN Florencia Reasons, MD      . metoprolol tartrate (LOPRESSOR) injection 2.5 mg  2.5 mg Intravenous Q4H PRN Florencia Reasons, MD      . propranolol (INDERAL) tablet 10 mg  10 mg Oral BID Florencia Reasons, MD   10 mg at 01/19/18 2244    Musculoskeletal: Strength & Muscle Tone: within normal limits Gait & Station: normal Patient leans: N/A  Psychiatric Specialty Exam: Physical Exam  Psychiatric: She has a normal mood and affect. Her speech is normal and behavior is normal. Judgment and thought content normal. Cognition and memory are normal.    Review of Systems  Constitutional: Negative.   HENT: Negative.   Eyes: Negative.   Respiratory: Negative.   Cardiovascular: Negative.   Gastrointestinal: Negative.   Genitourinary: Negative.   Musculoskeletal: Negative.   Skin: Negative.   Neurological: Negative.   Endo/Heme/Allergies: Negative.   Psychiatric/Behavioral: Negative.     Blood  pressure 135/78, pulse 88, temperature 97.9 F (36.6 C), temperature source Oral, resp. rate 20, height '5\' 7"'$  (1.702 m), weight (!) 141.1 kg (311 lb), SpO2 99 %.Body mass index is 48.71 kg/m.  General Appearance: Casual  Eye Contact:  Good  Speech:  Clear and Coherent  Volume:  Normal  Mood:  Euthymic  Affect:  Appropriate  Thought Process:  Coherent  Orientation:  Full (Time, Place, and Person)  Thought Content:  Logical  Suicidal Thoughts:  No  Homicidal Thoughts:  No  Memory:  Immediate;   Good Recent;   Good Remote;   Good  Judgement:  Intact  Insight:  Present  Psychomotor Activity:  Normal  Concentration:  Concentration: Fair and Attention Span: Fair  Recall:  Good  Fund of Knowledge:  Good  Language:  Good  Akathisia:  No  Handed:  Right  AIMS (if indicated):     Assets:  Communication Skills  ADL's:  Intact  Cognition:  WNL  Sleep:   good     Treatment Plan Summary: 27 year old with history of depression and anxiety who presents with tachycardia, tremors and suicidal thought. Patient is currently stable, she denies psychosis, delusions, SI/HI  Plan: Patient should be schedule to follow up with her psychiatrist for medication management upon discharge.  Disposition: No evidence of imminent risk to self or others at present.   Patient does not meet criteria for psychiatric inpatient admission. Supportive therapy provided about ongoing stressors. Patient will keep psychiatric appointment scheduled for January 31 st.  Corena Pilgrim, MD 01/19/2018 1:04 PM

## 2018-01-19 NOTE — Progress Notes (Signed)
Psychiatry has seen Pt and Pt is to be discharged to home this afternoon. Dr. Roda ShuttersXu updated via phone and agrees with plan for discharge. Pt given discharge instructions including Appointment on 01/23/2018 for follow up including medications. Pt verbalized understanding of all discharge instructions. Pt discharged to home with discharge packet

## 2018-01-20 LAB — T3, FREE: T3 FREE: 2.5 pg/mL (ref 2.0–4.4)

## 2018-01-20 NOTE — Progress Notes (Deleted)
Psychiatric Initial Adult Assessment   Patient Identification: Veronica Waters MRN:  [id number] Date of Evaluation:  01/20/2018 Referral Source: *** Chief Complaint:   Visit Diagnosis: No diagnosis found.  History of Present Illness:   Veronica Napoleonrica Gotcher is a 27 y.o. year old female with a history of depression, PTSD, anxiety, hypertension, seizure, who is referred for depression.   Per chart review, patient impulsively overdosed on Acetaminophen and on Gabapentin, September 2018. She had another suicide attempt, admitted at [facility], and then completed a Partial Hospital Program there.  On 1/12, patient presented to ED at [facility] for panic attacks in the setting of non adherence to medication.  On 1/13-22, [facility] for worsening depression, SI.  On 1/25, patient presented to ED for side effect from medication, then endorsed SI in the context of her boyfriend breaking up with her while she was in ED.  On 1/27, patient visited [facility] ED for tachycardia, tremor, anxiety. Discharged with this appointment.   Associated Signs/Symptoms: Depression Symptoms:  {DEPRESSION SYMPTOMS:20000} (Hypo) Manic Symptoms:  {BHH MANIC SYMPTOMS:22872} Anxiety Symptoms:  {BHH ANXIETY SYMPTOMS:22873} Psychotic Symptoms:  {BHH PSYCHOTIC SYMPTOMS:22874} PTSD Symptoms: {BHH PTSD SYMPTOMS:22875}  Past Psychiatric History:  Outpatient:  Psychiatry admission:  Previous suicide attempt:  Past trials of medication: fluoxetine, Trintellix, venlafaxine, wellbutrin, Abilify, latuda, geodon History of violence:   Previous Psychotropic Medications: {YES/NO:21197}  Substance Abuse History in the last 12 months:  {yes no:314532}  Consequences of Substance Abuse: {B[facility] CONSEQUENCES OF SUBSTANCE ABUSE:22880}  Past Medical History:  Past Medical History:  Diagnosis Date  . Asthma   . Hypertension    No past surgical history on file.  Family Psychiatric History: ***  Family History:  Family  History  Family history unknown: Yes    Social History:   Social History   Socioeconomic History  . Marital status: Single    Spouse name: Not on file  . Number of children: Not on file  . Years of education: Not on file  . Highest education level: Not on file  Social Needs  . Financial resource strain: Not on file  . Food insecurity - worry: Not on file  . Food insecurity - inability: Not on file  . Transportation needs - medical: Not on file  . Transportation needs - non-medical: Not on file  Occupational History  . Not on file  Tobacco Use  . Smoking status: Never Smoker  . Smokeless tobacco: Never Used  Substance and Sexual Activity  . Alcohol use: No  . Drug use: No  . Sexual activity: No    Birth control/protection: Other-see comments    Comment: Rod in left arm for birth control  Other Topics Concern  . Not on file  Social History Narrative  . Not on file    Additional Social History: ***  Allergies:   Allergies  Allergen Reactions  . Cranberry Hives and Swelling  . Aspirin Hives  . Vicodin [Hydrocodone-Acetaminophen] Hives    Metabolic Disorder Labs: Lab Results  Component Value Date   HGBA1C 5.4 01/06/2018   MPG 108.28 01/06/2018   No results found for: PROLACTIN Lab Results  Component Value Date   CHOL 218 (H) 01/06/2018   TRIG 134 01/06/2018   HDL 33 (L) 01/06/2018   CHOLHDL 6.6 01/06/2018   VLDL 27 01/06/2018   LDLCALC 158 (H) 01/06/2018     Current Medications: Current Outpatient Medications  Medication Sig Dispense Refill  . albuterol (PROVENTIL HFA;VENTOLIN HFA) 108 (90 Base)  MCG/ACT inhaler Inhale 1-2 puffs into the lungs every 6 (six) hours as needed for wheezing or shortness of breath. 1 Inhaler 0  . propranolol (INDERAL) 10 MG tablet Take 1 tablet (10 mg total) by mouth 2 (two) times daily. 60 tablet 0   No current facility-administered medications for this visit.     Neurologic: Headache: No Seizure:  No Paresthesias:No  Musculoskeletal: Strength & Muscle Tone: within normal limits Gait & Station: normal Patient leans: N/A  Psychiatric Specialty Exam: ROS  There were no vitals taken for this visit.There is no height or weight on file to calculate BMI.  General Appearance: Fairly Groomed  Eye Contact:  Good  Speech:  Clear and Coherent  Volume:  Normal  Mood:  {BHH MOOD:22306}  Affect:  {Affect (PAA):22687}  Thought Process:  Coherent and Goal Directed  Orientation:  Full (Time, Place, and Person)  Thought Content:  Logical  Suicidal Thoughts:  {ST/HT (PAA):22692}  Homicidal Thoughts:  {ST/HT (PAA):22692}  Memory:  Immediate;   Good Recent;   Good Remote;   Good  Judgement:  {Judgement (PAA):22694}  Insight:  {Insight (PAA):22695}  Psychomotor Activity:  Normal  Concentration:  Concentration: Good and Attention Span: Good  Recall:  Good  Fund of Knowledge:Good  Language: Good  Akathisia:  No  Handed:  Right  AIMS (if indicated):  N/A  Assets:  Communication Skills Desire for Improvement  ADL's:  Intact  Cognition: WNL  Sleep:  ***   Assessment  Plan  The patient demonstrates the following risk factors for suicide: Chronic risk factors for suicide include: {Chronic Risk Factors for ZOXWRUE:45409811}. Acute risk factors for suicide include: {Acute Risk Factors for BJYNWGN:56213086}. Protective factors for this patient include: {Protective Factors for Suicide VHQI:69629528}. Considering these factors, the overall suicide risk at this point appears to be {Desc; low/moderate/high:110033}. Patient {ACTION; IS/IS UXL:24401027} appropriate for outpatient follow up.   Treatment Plan Summary: Plan as above   Neysa Hotter, MD 1/28/201912:52 PM

## 2018-01-23 ENCOUNTER — Ambulatory Visit (HOSPITAL_COMMUNITY): Payer: Self-pay | Admitting: Psychiatry

## 2018-01-28 ENCOUNTER — Ambulatory Visit (HOSPITAL_COMMUNITY): Payer: Self-pay | Admitting: Psychiatry

## 2018-06-28 ENCOUNTER — Inpatient Hospital Stay (HOSPITAL_COMMUNITY)
Admission: RE | Admit: 2018-06-28 | Discharge: 2018-07-05 | DRG: 885 | Disposition: A | Discharge status: Home / Self Care | Payer: Federal, State, Local not specified - Other | Attending: Psychiatry | Admitting: Psychiatry

## 2018-06-28 DIAGNOSIS — F29 Unspecified psychosis not due to a substance or known physiological condition: Secondary | ICD-10-CM | POA: Diagnosis present

## 2018-06-28 DIAGNOSIS — Z79890 Hormone replacement therapy (postmenopausal): Secondary | ICD-10-CM

## 2018-06-28 DIAGNOSIS — E039 Hypothyroidism, unspecified: Secondary | ICD-10-CM | POA: Diagnosis present

## 2018-06-28 DIAGNOSIS — Z79899 Other long term (current) drug therapy: Secondary | ICD-10-CM | POA: Diagnosis not present

## 2018-06-28 DIAGNOSIS — Z915 Personal history of self-harm: Secondary | ICD-10-CM

## 2018-06-28 DIAGNOSIS — Z885 Allergy status to narcotic agent status: Secondary | ICD-10-CM | POA: Diagnosis not present

## 2018-06-28 DIAGNOSIS — J45909 Unspecified asthma, uncomplicated: Secondary | ICD-10-CM | POA: Diagnosis present

## 2018-06-28 DIAGNOSIS — I10 Essential (primary) hypertension: Secondary | ICD-10-CM | POA: Diagnosis present

## 2018-06-28 DIAGNOSIS — F603 Borderline personality disorder: Secondary | ICD-10-CM

## 2018-06-28 DIAGNOSIS — F3163 Bipolar disorder, current episode mixed, severe, without psychotic features: Secondary | ICD-10-CM | POA: Diagnosis present

## 2018-06-28 DIAGNOSIS — E669 Obesity, unspecified: Secondary | ICD-10-CM

## 2018-06-28 DIAGNOSIS — F4310 Post-traumatic stress disorder, unspecified: Secondary | ICD-10-CM

## 2018-06-28 DIAGNOSIS — Z886 Allergy status to analgesic agent status: Secondary | ICD-10-CM | POA: Diagnosis not present

## 2018-06-28 DIAGNOSIS — E1169 Type 2 diabetes mellitus with other specified complication: Secondary | ICD-10-CM

## 2018-06-28 DIAGNOSIS — R45851 Suicidal ideations: Secondary | ICD-10-CM | POA: Diagnosis present

## 2018-06-28 DIAGNOSIS — Z7984 Long term (current) use of oral hypoglycemic drugs: Secondary | ICD-10-CM

## 2018-06-28 DIAGNOSIS — Z91018 Allergy to other foods: Secondary | ICD-10-CM | POA: Diagnosis not present

## 2018-06-28 DIAGNOSIS — E119 Type 2 diabetes mellitus without complications: Secondary | ICD-10-CM | POA: Diagnosis present

## 2018-06-28 DIAGNOSIS — G4700 Insomnia, unspecified: Secondary | ICD-10-CM | POA: Diagnosis present

## 2018-06-28 DIAGNOSIS — F411 Generalized anxiety disorder: Secondary | ICD-10-CM | POA: Diagnosis present

## 2018-06-28 DIAGNOSIS — Z9119 Patient's noncompliance with other medical treatment and regimen: Secondary | ICD-10-CM | POA: Diagnosis not present

## 2018-06-28 MED ORDER — MAGNESIUM HYDROXIDE 400 MG/5ML PO SUSP: 30.0000 mL | Freq: Every day | ORAL | Status: DC | PRN

## 2018-06-28 MED ORDER — QUETIAPINE FUMARATE 50 MG PO TABS
50.0000 mg | ORAL_TABLET | Freq: Every day | ORAL | Status: DC
  Filled 2018-06-28: qty 1

## 2018-06-28 MED ORDER — ALUM & MAG HYDROXIDE-SIMETH 200-200-20 MG/5ML PO SUSP
30.0000 mL | ORAL | Status: DC | PRN
  Administered 2018-06-29: 30 mL via ORAL
  Filled 2018-06-28: qty 30

## 2018-06-28 MED ORDER — TRAZODONE HCL 100 MG PO TABS
100.0000 mg | ORAL_TABLET | Freq: Every evening | ORAL | Status: DC | PRN
  Filled 2018-06-28: qty 1

## 2018-06-28 MED ORDER — HYDROXYZINE HCL 25 MG PO TABS
25.0000 mg | ORAL_TABLET | Freq: Four times a day (QID) | ORAL | Status: DC | PRN
  Administered 2018-06-29 – 2018-06-30 (×2): 25 mg via ORAL
  Filled 2018-06-28 (×2): qty 1

## 2018-06-28 NOTE — H&P (Signed)
Behavioral Health Medical Screening Exam  Veronica Waters is an 27 y.o. female presenting as a walk-in to [facility], She has a hx of manic depressive and has been without her medications x 3 weeks, She goes to [facility]. She is endorsing SI with plan, denies HI, AVH, paranoia or delusional thoughts. Denies Illicit drug use and or alcohol consumption. She was previously prescribed lithium 400 mg BID and she is type 2 DM  Total Time spent with patient: 20 minutes  Psychiatric Specialty Exam: Physical Exam  Constitutional: She is oriented to person, place, and time. She appears well-developed and well-nourished. No distress.  HENT:  Head: Normocephalic.  Eyes: Pupils are equal, round, and reactive to light.  Respiratory: Effort normal and breath sounds normal. No respiratory distress.  Neurological: She is alert and oriented to person, place, and time. No cranial nerve deficit.  Skin: Skin is warm and dry. She is not diaphoretic.  Psychiatric: Her mood appears anxious. Her speech is rapid and/or pressured. She is agitated. Cognition and memory are impaired. She expresses impulsivity. She exhibits a depressed mood. She expresses suicidal ideation.    Review of Systems  Constitutional: Negative for chills, diaphoresis, fever, malaise/fatigue and weight loss.  Psychiatric/Behavioral: Positive for depression and suicidal ideas. Negative for hallucinations and substance abuse. The patient is nervous/anxious and has insomnia.   All other systems reviewed and are negative.   There were no vitals taken for this visit.There is no height or weight on file to calculate BMI.  General Appearance: Casual  Eye Contact:  Fair  Speech:  Pressured  Volume:  Increased  Mood:  Anxious and Depressed  Affect:  Congruent  Thought Process:  Goal Directed  Orientation:  Full (Time, Place, and Person)  Thought Content:  Logical  Suicidal Thoughts:  Yes.  without intent/plan  Homicidal Thoughts:  No  Memory:   Immediate;   Fair  Judgement:  Fair  Insight:  Fair  Psychomotor Activity:  Normal  Concentration: Concentration: Fair  Recall:  F[employer]ervFair  Fund of Knowledge:Fair  Language: Good  Akathisia:  Negative  Handed:  Right  AIMS (if indicated):     Assets:  Desire for Improvement  Sleep:       Musculoskeletal: Strength & Muscle Tone: within normal limits Gait & Station: normal Patient leans: N/A  There were no vitals taken for this visit.  Recommendations:  Based on my evaluation the patient does not appear to have an emergency medical condition.  Kerry HoughSpencer E Bryn Perkin, PA-C 06/28/2018, 11:59 PM

## 2018-06-29 ENCOUNTER — Other Ambulatory Visit: Payer: Self-pay

## 2018-06-29 ENCOUNTER — Encounter (HOSPITAL_COMMUNITY): Payer: Self-pay | Admitting: *Deleted

## 2018-06-29 DIAGNOSIS — F3163 Bipolar disorder, current episode mixed, severe, without psychotic features: Principal | ICD-10-CM

## 2018-06-29 DIAGNOSIS — E1169 Type 2 diabetes mellitus with other specified complication: Secondary | ICD-10-CM

## 2018-06-29 DIAGNOSIS — E669 Obesity, unspecified: Secondary | ICD-10-CM

## 2018-06-29 DIAGNOSIS — F4310 Post-traumatic stress disorder, unspecified: Secondary | ICD-10-CM

## 2018-06-29 DIAGNOSIS — F603 Borderline personality disorder: Secondary | ICD-10-CM

## 2018-06-29 LAB — URINALYSIS, ROUTINE W REFLEX MICROSCOPIC
BILIRUBIN URINE: NEGATIVE
Glucose, UA: NEGATIVE mg/dL
HGB URINE DIPSTICK: NEGATIVE
KETONES UR: NEGATIVE mg/dL
LEUKOCYTES UA: NEGATIVE
NITRITE: NEGATIVE
Protein, ur: NEGATIVE mg/dL
Specific Gravity, Urine: 1.024 (ref 1.005–1.030)
pH: 5 (ref 5.0–8.0)

## 2018-06-29 LAB — RAPID URINE DRUG SCREEN, HOSP PERFORMED
Amphetamines: NOT DETECTED
Benzodiazepines: NOT DETECTED
Cocaine: NOT DETECTED
Opiates: NOT DETECTED
Tetrahydrocannabinol: NOT DETECTED

## 2018-06-29 LAB — GLUCOSE, CAPILLARY
GLUCOSE-CAPILLARY: 92 mg/dL (ref 70–99)
GLUCOSE-CAPILLARY: 97 mg/dL (ref 70–99)

## 2018-06-29 LAB — PREGNANCY, URINE: PREG TEST UR: NEGATIVE

## 2018-06-29 MED ORDER — QUETIAPINE FUMARATE 100 MG PO TABS
100.0000 mg | ORAL_TABLET | Freq: Every day | ORAL | Status: DC
  Administered 2018-06-29: 100 mg via ORAL
  Filled 2018-06-29: qty 1

## 2018-06-29 MED ORDER — GABAPENTIN 600 MG PO TABS
600.0000 mg | ORAL_TABLET | Freq: Three times a day (TID) | ORAL | Status: DC
  Administered 2018-06-29 – 2018-07-03 (×13): 600 mg via ORAL
  Filled 2018-06-29 (×19): qty 1

## 2018-06-29 MED ORDER — QUETIAPINE FUMARATE 100 MG PO TABS
ORAL_TABLET | ORAL | Status: AC
  Filled 2018-06-29: qty 1

## 2018-06-29 MED ORDER — HYDROXYZINE HCL 50 MG PO TABS
ORAL_TABLET | ORAL | Status: AC
  Filled 2018-06-29: qty 1

## 2018-06-29 MED ORDER — ALBUTEROL SULFATE HFA 108 (90 BASE) MCG/ACT IN AERS: 2.0000 | INHALATION_SPRAY | Freq: Four times a day (QID) | RESPIRATORY_TRACT | Status: DC | PRN

## 2018-06-29 MED ORDER — HYDROXYZINE HCL 50 MG PO TABS
50.0000 mg | ORAL_TABLET | Freq: Once | ORAL | Status: AC
  Administered 2018-06-29: 50 mg via ORAL
  Filled 2018-06-29: qty 1

## 2018-06-29 MED ORDER — QUETIAPINE FUMARATE 50 MG PO TABS
50.0000 mg | ORAL_TABLET | Freq: Every day | ORAL | Status: DC
  Administered 2018-06-29: 50 mg via ORAL
  Filled 2018-06-29 (×3): qty 1

## 2018-06-29 MED ORDER — FLUOXETINE HCL 20 MG PO CAPS
20.0000 mg | ORAL_CAPSULE | Freq: Every day | ORAL | Status: DC
  Administered 2018-06-29 – 2018-07-03 (×5): 20 mg via ORAL
  Filled 2018-06-29 (×7): qty 1

## 2018-06-29 MED ORDER — PROPRANOLOL HCL 20 MG PO TABS
20.0000 mg | ORAL_TABLET | Freq: Two times a day (BID) | ORAL | Status: DC
  Administered 2018-06-29 – 2018-07-05 (×13): 20 mg via ORAL
  Filled 2018-06-29 (×5): qty 1
  Filled 2018-06-29: qty 14
  Filled 2018-06-29 (×7): qty 1
  Filled 2018-06-29: qty 14
  Filled 2018-06-29 (×4): qty 1

## 2018-06-29 MED ORDER — METFORMIN HCL ER 500 MG PO TB24
500.0000 mg | ORAL_TABLET | Freq: Every day | ORAL | Status: DC
  Administered 2018-06-29 – 2018-07-05 (×7): 500 mg via ORAL
  Filled 2018-06-29: qty 7
  Filled 2018-06-29 (×8): qty 1

## 2018-06-29 MED ORDER — LITHIUM CARBONATE ER 450 MG PO TBCR
450.0000 mg | EXTENDED_RELEASE_TABLET | Freq: Two times a day (BID) | ORAL | Status: DC
  Administered 2018-06-29 – 2018-07-03 (×9): 450 mg via ORAL
  Filled 2018-06-29 (×13): qty 1

## 2018-06-29 MED ORDER — HYDROXYZINE HCL 25 MG PO TABS
ORAL_TABLET | ORAL | Status: AC
  Administered 2018-06-29: 18:00:00
  Filled 2018-06-29: qty 1

## 2018-06-29 NOTE — Progress Notes (Signed)
Adult Psychoeducational Group Note  Date:  06/29/2018 Time:  9:47 PM  Group Topic/Focus:  Wrap-Up Group:   The focus of this group is to help patients review their daily goal of treatment and discuss progress on daily workbooks.  Participation Level:  Active  Participation Quality:  Appropriate  Affect:  Appropriate  Cognitive:  Appropriate  Insight: Appropriate  Engagement in Group: Appropriate  Modes of Intervention:  Discussion  Additional Comments:  Pt rated the day at 1/10 due to just starting meds and it's affecting her stomach.  Pt has been in bed all day.  Nkechi Linehan 06/29/2018, 9:47 PM
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
## 2018-06-29 NOTE — Progress Notes (Signed)
Adult Psychoeducational Group Note  Date:  06/29/2018 Time:  10:35 AM  Group Topic/Focus:  Healthy Support Groups  Participation Level:  Did Not Attend  Participation Quality:  Did not attend  Affect:  Did not attend  Cognitive:  Did not attend  Insight: None  Engagement in Group:  Did not attend  Modes of Intervention:  Did not attend  Additional Comments:  Did not attend  Veronica Waters 06/29/2018, 10:35 AM

## 2018-06-29 NOTE — H&P (Signed)
Psychiatric Admission Assessment Adult  Patient Identification: Veronica Waters MRN:  [id number] Date of Evaluation:  06/29/2018 Chief Complaint:  bipolar disorder Principal Diagnosis: <principal problem not specified> Diagnosis:   Patient Active Problem List   Diagnosis Date Noted  . Bipolar 1 disorder, mixed, severe (HCC) [F31.63] 06/28/2018  . Tachycardia [R00.0] 01/18/2018  . Severe recurrent major depression without psychotic features (HCC) [F33.2] 01/05/2018  . MDD (major depressive disorder), recurrent severe, without psychosis (HCC) [F33.2] 09/01/2017   History of Present Illness: Patient is seen and examined.  Patient is a 27 year old female with a past psychiatric history significant for reported bipolar disorder, posttraumatic stress disorder, borderline personality disorder who presented to the behavioral health hospital reporting symptoms of depression, anxiety and suicidal ideation.  The patient this a.m. was a poor historian secondary to "being sleepy".  Most the information for the admission H&P and suicide assessment was done from the old charts.  Her chart review revealed that she had decided to stop taking her psychiatric medicines approximately 3 weeks prior to admission.  She did not believe that she thought she needed them any longer.  Approximately 1 week ago she found out that her grandmother died in March, and that her family did not tell her about this.  After that she reported increasing symptoms of crying spells, social withdrawal, loss of usual interest in activities, fatigue, irritability, decreased sleep, decreased appetite and feelings of guilt and helplessness.  The patient had been taking 4 tablespoons of NyQuil every other night for sleep but only got 3 hours of sleep the night prior to admission.  The patient has a history of multiple psychiatric admissions and was [facility] March [facility].  She had attempted suicide at that time, and was  admitted to the ICU.  She also has a history of cutting and numerous scars on both arms.  She apparently had not cut in the last 6 weeks.  Unfortunately her family was out of town this week and during her decompensation.  She was admitted to the hospital for evaluation and stabilization. Associated Signs/Symptoms: Depression Symptoms:  depressed mood, anhedonia, insomnia, psychomotor agitation, fatigue, feelings of worthlessness/guilt, difficulty concentrating, hopelessness, suicidal thoughts without plan, suicidal attempt, anxiety, disturbed sleep, weight gain, (Hypo) Manic Symptoms:  Impulsivity, Irritable Mood, Anxiety Symptoms:  Excessive Worry, Psychotic Symptoms:  Denied PTSD Symptoms: Had a traumatic exposure:  In the past Total Time spent with patient: 30 minutes  Past Psychiatric History: Patient has had several hospitalizations in the past.  [facility].  Her last hospitalization was in March of this year.  That was due to suicidal ideation.  Prior to that she had been admitted to the hospital on 01/31/2018 secondary to to an intentional drug overdose.  Her last hospitalization at our facility was on 01/05/2018.  That was secondary to an intentional drug overdose.  Is the patient at risk to self? Yes.    Has the patient been a risk to self in the past 6 months? Yes.    Has the patient been a risk to self within the distant past? Yes.    Is the patient a risk to others? No.  Has the patient been a risk to others in the past 6 months? No.  Has the patient been a risk to others within the distant past? No.   Prior Inpatient Therapy: Prior Inpatient Therapy: Yes Prior Therapy Dates: 2018-2019 [facility] [place] Reason for Treatment: Bipolar disorder Prior Outpatient Therapy: Prior Outpatient Therapy: Yes Prior Therapy Dates: 2018 Prior Therapy  Facilty/Provider(s): Unknown Reason for Treatment: Bipolar disorder Does patient have an ACCT team?: No Does patient have Intensive In-House Services?  : No Does patient have [facility] services? : No Does patient have P4CC services?: No  Alcohol Screening: 1. How often do you have a drink containing alcohol?: Never 2. How many drinks containing alcohol do you have on a typical day when you are drinking?: 1 or 2 3. How often do you have six or more drinks on one occasion?: Never AUDIT-C Score: 0 Intervention/Follow-up: AUDIT Score <7 follow-up not indicated Substance Abuse History in the last 12 months:  No. Consequences of Substance Abuse: Negative Previous Psychotropic Medications: Yes  Psychological Evaluations: Yes  Past Medical History:  Past Medical History:  Diagnosis Date  . Asthma   . Hypertension    History reviewed. No pertinent surgical history. Family History:  Family History  Family history unknown: Yes   Family Psychiatric  History: Cousin committed suicide, dad is an alcoholic. Tobacco Screening: Have you used any form of tobacco in the last 30 days? (Cigarettes, Smokeless Tobacco, Cigars, and/or Pipes): No Social History:  Social History   Substance and Sexual Activity  Alcohol Use No     Social History   Substance and Sexual Activity  Drug Use No    Additional Social History: Marital status: Divorced    Pain Medications: See MAR Prescriptions: See MAR Over the Counter: See MAR History of alcohol / drug use?: Yes(Pt reports a history of using a variety of drugs in the past.) Longest period of sobriety (when/how long): 4 years currently Negative Consequences of Use: Financial, Legal                    Allergies:   Allergies  Allergen Reactions  . Cranberry Hives and Swelling  . Aspirin Hives  . Vicodin [Hydrocodone-Acetaminophen] Hives   Lab Results:  Results for orders placed or performed during the hospital encounter of 06/28/18 (from  the past 48 hour(s))  Glucose, capillary     Status: None   Collection Time: 06/29/18 12:47 AM  Result Value Ref Range   Glucose-Capillary 97 70 - 99 mg/dL  Glucose, capillary     Status: None   Collection Time: 06/29/18 11:59 AM  Result Value Ref Range   Glucose-Capillary 92 70 - 99 mg/dL    Blood Alcohol level:  Lab Results  Component Value Date   ETH <10 01/18/2018   ETH <5 08/31/2017    Metabolic Disorder Labs:  Lab Results  Component Value Date   HGBA1C 5.4 01/06/2018   MPG 108.28 01/06/2018   No results found for: PROLACTIN Lab Results  Component Value Date   CHOL 218 (H) 01/06/2018   TRIG 134 01/06/2018   HDL 33 (L) 01/06/2018   CHOLHDL 6.6 01/06/2018   VLDL 27 01/06/2018   LDLCALC 158 (H) 01/06/2018    Current Medications: Current Facility-Administered Medications  Medication Dose Route Frequency Provider Last Rate Last Dose  . albuterol (PROVENTIL HFA;VENTOLIN HFA) 108 (90 Base) MCG/ACT inhaler 2 puff  2 puff Inhalation Q6H PRN Antonieta Pert, MD      . alum & mag hydroxide-simeth (MAALOX/MYLANTA) 200-200-20 MG/5ML suspension 30 mL  30 mL Oral Q4H PRN Kerry Hough, PA-C      . FLUoxetine (PROZAC) capsule 20 mg  20 mg Oral Daily Jola Babinski, Marlane Mingle, MD  20 mg at 06/29/18 1023  . gabapentin (NEURONTIN) tablet 600 mg  600 mg Oral TID Antonieta Pert, MD   600 mg at 06/29/18 1204  . hydrOXYzine (ATARAX/VISTARIL) tablet 25 mg  25 mg Oral Q6H PRN Donell Sievert E, PA-C      . lithium carbonate (ESKALITH) CR tablet 450 mg  450 mg Oral Q12H Antonieta Pert, MD   450 mg at 06/29/18 1025  . magnesium hydroxide (MILK OF MAGNESIA) suspension 30 mL  30 mL Oral Daily PRN Kerry Hough, PA-C      . metFORMIN (GLUCOPHAGE-XR) 24 hr tablet 500 mg  500 mg Oral Q breakfast Antonieta Pert, MD   500 mg at 06/29/18 1024  . propranolol (INDERAL) tablet 20 mg  20 mg Oral BID Antonieta Pert, MD   20 mg at 06/29/18 1024  . QUEtiapine (SEROQUEL) tablet 50 mg  50  mg Oral QHS Antonieta Pert, MD       PTA Medications: Medications Prior to Admission  Medication Sig Dispense Refill Last Dose  . albuterol (PROVENTIL HFA;VENTOLIN HFA) 108 (90 Base) MCG/ACT inhaler Inhale 1-2 puffs into the lungs every 6 (six) hours as needed for wheezing or shortness of breath. 1 Inhaler 0 Past Month at Unknown time  . clonazePAM (KLONOPIN) 0.5 MG tablet Take 0.5 mg by mouth 3 (three) times daily as needed for anxiety.   Past Month at Unknown time  . FLUoxetine (PROZAC) 20 MG capsule Take 20 mg by mouth daily.   Past Month at Unknown time  . gabapentin (NEURONTIN) 300 MG capsule Take 600 mg by mouth 3 (three) times daily.   Past Month at Unknown time  . lithium carbonate (ESKALITH) 450 MG CR tablet Take by mouth 2 (two) times daily.   Past Month at Unknown time  . metFORMIN (GLUCOPHAGE-XR) 500 MG 24 hr tablet Take 500 mg by mouth daily with breakfast.   Past Month at Unknown time  . naltrexone (DEPADE) 50 MG tablet Take 50 mg by mouth daily.   Past Month at Unknown time  . propranolol (INDERAL) 20 MG tablet Take 20 mg by mouth 2 (two) times daily.   Past Month at Unknown time  . QUEtiapine (SEROQUEL) 50 MG tablet Take 50 mg by mouth at bedtime.   Past Month at Unknown time    Musculoskeletal: Strength & Muscle Tone: within normal limits Gait & Station: normal Patient leans: N/A  Psychiatric Specialty Exam: Physical Exam  Nursing note and vitals reviewed. Constitutional: She is oriented to person, place, and time. She appears well-developed and well-nourished.  HENT:  Head: Normocephalic and atraumatic.  Respiratory: Effort normal.  Neurological: She is alert and oriented to person, place, and time.    ROS  Blood pressure (!) 171/142, pulse (!) 114, temperature 98.1 F (36.7 C), temperature source Oral, resp. rate 20, height 5\' 6"  (1.676 m), weight (!) 137 kg (302 lb).Body mass index is 48.74 kg/m.  General Appearance: Disheveled  Eye Contact:  Poor  Speech:   Slow  Volume:  Decreased  Mood:  Dysphoric and Irritable  Affect:  Congruent  Thought Process:  Coherent  Orientation:  Full (Time, Place, and Person)  Thought Content:  Negative  Suicidal Thoughts:  Yes.  without intent/plan  Homicidal Thoughts:  No  Memory:  Immediate;   Poor Recent;   Poor Remote;   Poor  Judgement:  Impaired  Insight:  Lacking  Psychomotor Activity:  Normal  Concentration:  Concentration: Poor and  Attention Span: Poor  Recall:  Poor  Fund of Knowledge:  Poor  Language:  Poor  Akathisia:  Negative  Handed:  Right  AIMS (if indicated):     Assets:  Desire for Improvement  ADL's:  Intact  Cognition:  WNL  Sleep:  Number of Hours: 3.5    Treatment Plan Summary: Daily contact with patient to assess and evaluate symptoms and progress in treatment, Medication management and Plan Patient is seen and examined.  Patient is a 27 year old female with the above-stated past psychiatric history was admitted with suicidal ideation.  She has been noncompliant with her medications.  We will restart her medicines she had been on previously.  This includes fluoxetine 20 mg p.o. daily, gabapentin 600 mg p.o. 3 times daily, lithium carbonate controlled release 450 mg twice daily, propranolol, and Seroquel.  We will have an add on lab to previously drawn blood in the emergency room to look and see if she had any lithium in her system at all.  It will most likely be negative.  She also has diabetes, and we will continue her metformin.  We will put her on daily glucose checks.  She will also continue the propranolol and albuterol respectively.  We will admit her to the hospital.  She will be integrated into the milieu.  She will meet with social work individually as well as in groups.  She will be encouraged to attend groups.  We will collect collateral information from her family.  She will be placed on 15-minute checks for safety.  Hopefully restarting her medications will be  beneficial.  Observation Level/Precautions:  15 minute checks  Laboratory:  Chemistry Profile  Psychotherapy:    Medications:    Consultations:    Discharge Concerns:    Estimated LOS:  Other:     Physician Treatment Plan for Primary Diagnosis: <principal problem not specified> Long Term Goal(s): Improvement in symptoms so as ready for discharge  Short Term Goals: Ability to identify changes in lifestyle to reduce recurrence of condition will improve, Ability to verbalize feelings will improve, Ability to disclose and discuss suicidal ideas, Ability to demonstrate self-control will improve, Ability to identify and develop effective coping behaviors will improve, Ability to maintain clinical measurements within normal limits will improve and Compliance with prescribed medications will improve  Physician Treatment Plan for Secondary Diagnosis: Active Problems:   Bipolar 1 disorder, mixed, severe (HCC)  Long Term Goal(s): Improvement in symptoms so as ready for discharge  Short Term Goals: Ability to identify changes in lifestyle to reduce recurrence of condition will improve, Ability to verbalize feelings will improve, Ability to disclose and discuss suicidal ideas, Ability to demonstrate self-control will improve, Ability to identify and develop effective coping behaviors will improve, Ability to maintain clinical measurements within normal limits will improve and Compliance with prescribed medications will improve  I certify that inpatient services furnished can reasonably be expected to improve the patient's condition.    Antonieta Pert, MD 7/7/201912:41 PM

## 2018-06-29 NOTE — Plan of Care (Signed)
  Problem: Coping: Goal: Coping ability will improve Outcome: Progressing   

## 2018-06-29 NOTE — Tx Team (Signed)
Initial Treatment Plan 06/29/2018 2:25 AM Veronica Waters ZOX:[id number]    PATIENT STRESSORS: Marital or family conflict Medication change or noncompliance Traumatic event   PATIENT STRENGTHS: Active sense of humor Average or above average intelligence Capable of independent living Communication skills Financial means Supportive family/friends Work skills   PATIENT IDENTIFIED PROBLEMS: Depression  Suicidal ideation  "Get meds straight"  "Stay on meds"               DISCHARGE CRITERIA:  Improved stabilization in mood, thinking, and/or behavior Motivation to continue treatment in a less acute level of care Need for constant or close observation no longer present Verbal commitment to aftercare and medication compliance  PRELIMINARY DISCHARGE PLAN: Outpatient therapy Return to previous living arrangement Return to previous work or school arrangements  PATIENT/FAMILY INVOLVEMENT: This treatment plan has been presented to and reviewed with the patient, Veronica Napoleonrica Waters.  The patient and family have been given the opportunity to ask questions and make suggestions.  Juliann ParesBowman, Dushaun Okey Elizabeth, RN 06/29/2018, 2:25 AM
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
## 2018-06-29 NOTE — Progress Notes (Signed)
Pt is observed in the room, seen resting in bed with eyes open. Pt appears flat/anxious in affect and mood. Pt denies SI/HI/AVH/Pain at this time. Pt appears to have limited insight.C/o of nausea this evening; Maalox and gingerale given with relief. PRN vistaril requested and given. Pt states med was just restarted; Pt states she is reluctant to take a lot of meds. CBG in a.m. Will continue with POC.

## 2018-06-29 NOTE — Progress Notes (Addendum)
D Pt is sound asleep at 1025 when this writer goes to her bedside , calls her name ( for the 3rd time this am) and prompts her to wake up and take her morning meds.  She is sleepy, irritable and grouchy when she approaches med window. She wears the clothes she was sleeping in. Her hair is uncombed. She has poor hygiene. She has a flat, agitated appearance and says " I haven't even called my mom to tell her where I am".      A SHe completed her daily assessment this am and on it she wrote she denied having SI today and she rated her depression, hopelessness and anxeity " 8/8/8/", respectively. She provides Clinical research associatewriter with fresh urine sample ( for UPT and UA / CS) and this is placed in frig. She says " I wanna talk to you...just not now...".     R Safety is in place and therapeutic relationhip is fostered by Clinical research associatewriter.

## 2018-06-29 NOTE — Plan of Care (Signed)
  Problem: Coping: Goal: Coping ability will improve 06/29/2018 1048 by Rutherford Guysuke, Ollis Daudelin L, RN Outcome: Not Progressing 06/29/2018 0927 by Rutherford Guysuke, Ramsie Ostrander L, RN Outcome: Progressing

## 2018-06-30 LAB — COMPREHENSIVE METABOLIC PANEL
ALBUMIN: 3.7 g/dL (ref 3.5–5.0)
ALT: 25 U/L (ref 0–44)
ANION GAP: 8 (ref 5–15)
AST: 17 U/L (ref 15–41)
Alkaline Phosphatase: 77 U/L (ref 38–126)
BUN: 10 mg/dL (ref 6–20)
CHLORIDE: 106 mmol/L (ref 98–111)
CO2: 26 mmol/L (ref 22–32)
Calcium: 9.4 mg/dL (ref 8.9–10.3)
Creatinine, Ser: 0.73 mg/dL (ref 0.44–1.00)
GFR calc non Af Amer: >60 mL/min (ref >60)
Glucose, Bld: 95 mg/dL (ref 70–99)
Potassium: 3.8 mmol/L (ref 3.5–5.1)
SODIUM: 140 mmol/L (ref 135–145)
Total Bilirubin: 0.4 mg/dL (ref 0.3–1.2)
Total Protein: 7.7 g/dL (ref 6.5–8.1)

## 2018-06-30 LAB — TSH: TSH: 5.778 u[IU]/mL — ABNORMAL HIGH (ref 0.350–4.500)

## 2018-06-30 LAB — GLUCOSE, CAPILLARY: Glucose-Capillary: 84 mg/dL (ref 70–99)

## 2018-06-30 LAB — LIPID PANEL
CHOL/HDL RATIO: 7 ratio
CHOLESTEROL: 230 mg/dL — AB (ref 0–200)
HDL: 33 mg/dL — AB (ref >40)
LDL Cholesterol: 124 mg/dL — ABNORMAL HIGH (ref 0–99)
Triglycerides: 366 mg/dL — ABNORMAL HIGH (ref <150)
VLDL: 73 mg/dL — ABNORMAL HIGH (ref 0–40)

## 2018-06-30 LAB — CBC
HCT: 41.9 % (ref 36.0–46.0)
Hemoglobin: 13.3 g/dL (ref 12.0–15.0)
MCH: 27.1 pg (ref 26.0–34.0)
MCHC: 31.7 g/dL (ref 30.0–36.0)
MCV: 85.5 fL (ref 78.0–100.0)
PLATELETS: 392 10*3/uL (ref 150–400)
RBC: 4.9 MIL/uL (ref 3.87–5.11)
RDW: 15.2 % (ref 11.5–15.5)
WBC: 7.6 10*3/uL (ref 4.0–10.5)

## 2018-06-30 LAB — HEMOGLOBIN A1C
Hgb A1c MFr Bld: 5.4 % (ref 4.8–5.6)
Mean Plasma Glucose: 108.28 mg/dL

## 2018-06-30 LAB — LITHIUM LEVEL: Lithium Lvl: 0.26 mmol/L — ABNORMAL LOW (ref 0.60–1.20)

## 2018-06-30 MED ORDER — HYDROXYZINE HCL 50 MG PO TABS
50.0000 mg | ORAL_TABLET | Freq: Four times a day (QID) | ORAL | Status: DC | PRN
  Administered 2018-06-30 – 2018-07-01 (×3): 50 mg via ORAL
  Filled 2018-06-30 (×3): qty 1

## 2018-06-30 MED ORDER — HYDROXYZINE HCL 50 MG PO TABS
50.0000 mg | ORAL_TABLET | Freq: Once | ORAL | Status: AC
  Administered 2018-06-30: 50 mg via ORAL
  Filled 2018-06-30 (×2): qty 1

## 2018-06-30 MED ORDER — QUETIAPINE FUMARATE 100 MG PO TABS
100.0000 mg | ORAL_TABLET | Freq: Every day | ORAL | Status: DC
  Administered 2018-06-30 – 2018-07-01 (×2): 100 mg via ORAL
  Filled 2018-06-30 (×4): qty 1

## 2018-06-30 NOTE — Progress Notes (Signed)
D: Patient was asked to come up for medications this morning and told the MHT that she wanted to remain in bed.  Patient came up around 930 to request her medications and I informed her that I put them down as refused because she would not get out of bed.  Patient started mumbling and cursing under her breath.  Patient promptly went back to her room.    A: Continue to monitor medication management and MD orders.  Safety checks completed every 15 minutes per protocol.  Offer support and encouragement as needed.  R: Patient remains isolative to her room and unwilling to participate.

## 2018-06-30 NOTE — Progress Notes (Signed)
[facility] MD Progress Note  06/30/2018 1:46 PM Veronica Waters  MRN:  [id number] Subjective: Patient is seen and examined.  Patient is a 27 year old female with a past psychiatric history significant for bipolar disorder, posttraumatic stress disorder, borderline personality disorder who was admitted secondary to suicidal ideation.  The patient had stopped taking her medicines several weeks ago.  She remains somewhat irritable this morning.  She got into it with nursing staff about her medications.  She stated she does not take her medications until later in the morning, and not at 830.  We discussed that.  Her lithium level came back at 0.2-6.  That was not surprising given her noncompliance.  She stated that she continues to have fleeting suicidal ideation, but is much more pleasant today than she had been yesterday.  We discussed the need to stay on her medications.  She had been on Klonopin previously but does not want to go on that, but feels like the hydroxyzine needs to be increased.  I told her we could increase it to 50 mg and continue the same frequency as needed.  Her blood sugar this morning is 89.  Blood pressure is normal. Principal Problem: <principal problem not specified> Diagnosis:   Patient Active Problem List   Diagnosis Date Noted  . Posttraumatic stress disorder [F43.10]   . Borderline personality disorder ([place]) [F60.3]   . Diabetes mellitus type 2 in obese (HCC) [E11.69, E66.9]   . Bipolar 1 disorder, mixed, severe ([place]) [F31.63] 06/28/2018  . Tachycardia [R00.0] 01/18/2018  . Severe recurrent major depression without psychotic features ([place]) [F33.2] 01/05/2018  . MDD (major depressive disorder), recurrent severe, without psychosis ([place]) [F33.2] 09/01/2017   Total Time spent with patient: 20 minutes  Past Psychiatric History: See admission H&P  Past Medical History:  Past Medical History:  Diagnosis Date  . Asthma   . Hypertension    History reviewed. No pertinent surgical  history. Family History:  Family History  Family history unknown: Yes   Family Psychiatric  History: See admission H&P Social History:  Social History   Substance and Sexual Activity  Alcohol Use No     Social History   Substance and Sexual Activity  Drug Use No    Social History   Socioeconomic History  . Marital status: Single    Spouse name: Not on file  . Number of children: Not on file  . Years of education: Not on file  . Highest education level: Not on file  Occupational History  . Not on file  Social Needs  . Financial resource strain: Not on file  . Food insecurity:    Worry: Not on file    Inability: Not on file  . Transportation needs:    Medical: Not on file    Non-medical: Not on file  Tobacco Use  . Smoking status: Never Smoker  . Smokeless tobacco: Never Used  Substance and Sexual Activity  . Alcohol use: No  . Drug use: No  . Sexual activity: Never    Birth control/protection: Other-see comments    Comment: Rod in left arm for birth control  Lifestyle  . Physical activity:    Days per week: Not on file    Minutes per session: Not on file  . Stress: Not on file  Relationships  . Social connections:    Talks on phone: Not on file    Gets together: Not on file    Attends religious service: Not on file  Active member of club or organization: Not on file    Attends meetings of clubs or organizations: Not on file    Relationship status: Not on file  Other Topics Concern  . Not on file  Social History Narrative  . Not on file   Additional Social History:    Pain Medications: See MAR Prescriptions: See MAR Over the Counter: See MAR History of alcohol / drug use?: Yes(Pt reports a history of using a variety of drugs in the past.) Longest period of sobriety (when/how long): 4 years currently Negative Consequences of Use: Financial, Legal                    Sleep: Good  Appetite:  Good  Current Medications: Current  Facility-Administered Medications  Medication Dose Route Frequency Provider Last Rate Last Dose  . albuterol (PROVENTIL HFA;VENTOLIN HFA) 108 (90 Base) MCG/ACT inhaler 2 puff  2 puff Inhalation Q6H PRN Sharma Covert, MD      . alum & mag hydroxide-simeth (MAALOX/MYLANTA) 200-200-20 MG/5ML suspension 30 mL  30 mL Oral Q4H PRN Patriciaann Clan E, PA-C   30 mL at 06/29/18 2056  . FLUoxetine (PROZAC) capsule 20 mg  20 mg Oral Daily Sharma Covert, MD   20 mg at 06/30/18 1005  . gabapentin (NEURONTIN) tablet 600 mg  600 mg Oral TID Sharma Covert, MD   600 mg at 06/30/18 1147  . hydrOXYzine (ATARAX/VISTARIL) tablet 50 mg  50 mg Oral Q6H PRN Sharma Covert, MD      . lithium carbonate (ESKALITH) CR tablet 450 mg  450 mg Oral Q12H Sharma Covert, MD   450 mg at 06/30/18 1006  . magnesium hydroxide (MILK OF MAGNESIA) suspension 30 mL  30 mL Oral Daily PRN Laverle Hobby, PA-C      . metFORMIN (GLUCOPHAGE-XR) 24 hr tablet 500 mg  500 mg Oral Q breakfast Sharma Covert, MD   500 mg at 06/30/18 1003  . propranolol (INDERAL) tablet 20 mg  20 mg Oral BID Sharma Covert, MD   20 mg at 06/30/18 1003  . QUEtiapine (SEROQUEL) tablet 50 mg  50 mg Oral QHS Sharma Covert, MD   50 mg at 06/29/18 2056    Lab Results:  Results for orders placed or performed during the hospital encounter of 06/28/18 (from the past 48 hour(s))  Glucose, capillary     Status: None   Collection Time: 06/29/18 12:47 AM  Result Value Ref Range   Glucose-Capillary 97 70 - 99 mg/dL  Urinalysis, Routine w reflex microscopic     Status: Abnormal   Collection Time: 06/29/18  6:00 AM  Result Value Ref Range   Color, Urine YELLOW YELLOW   APPearance TURBID (A) CLEAR   Specific Gravity, Urine 1.024 1.005 - 1.030   pH 5.0 5.0 - 8.0   Glucose, UA NEGATIVE NEGATIVE mg/dL   Hgb urine dipstick NEGATIVE NEGATIVE   Bilirubin Urine NEGATIVE NEGATIVE   Ketones, ur NEGATIVE NEGATIVE mg/dL   Protein, ur NEGATIVE  NEGATIVE mg/dL   Nitrite NEGATIVE NEGATIVE   Leukocytes, UA NEGATIVE NEGATIVE   Bacteria, UA MANY (A) NONE SEEN   Squamous Epithelial / LPF 0-5 0 - 5   Mucus PRESENT    Budding Yeast PRESENT    Ca Oxalate Crys, UA PRESENT     Comment: Performed at [facility], [place] [address] [place], [place] [address]  Urine rapid drug screen (hosp performed)not at  [facility]     Status: Abnormal   Collection Time: 06/29/18  6:00 AM  Result Value Ref Range   Opiates NONE DETECTED NONE DETECTED   Cocaine NONE DETECTED NONE DETECTED   Benzodiazepines NONE DETECTED NONE DETECTED   Amphetamines NONE DETECTED NONE DETECTED   Tetrahydrocannabinol NONE DETECTED NONE DETECTED   Barbiturates (A) NONE DETECTED    Result not available. Reagent lot number recalled by manufacturer.    Comment: Performed at [facility], [place] [address] [place], [place] [address]  Pregnancy, urine     Status: None   Collection Time: 06/29/18  6:06 AM  Result Value Ref Range   Preg Test, Ur NEGATIVE NEGATIVE    Comment:        THE SENSITIVITY OF THIS METHODOLOGY IS >20 mIU/mL. Performed at [facility], [place] [address] [place], [place] [address]   Glucose, capillary     Status: None   Collection Time: 06/29/18 11:59 AM  Result Value Ref Range   Glucose-Capillary 92 70 - 99 mg/dL  Glucose, capillary     Status: None   Collection Time: 06/30/18  5:40 AM  Result Value Ref Range   Glucose-Capillary 84 70 - 99 mg/dL   Comment 1 Notify RN    Comment 2 Document in Chart   CBC     Status: None   Collection Time: 06/30/18  6:22 AM  Result Value Ref Range   WBC 7.6 4.0 - 10.5 K/uL   RBC 4.90 3.87 - 5.11 MIL/uL   Hemoglobin 13.3 12.0 - 15.0 g/dL   HCT 41.9 36.0 - 46.0 %   MCV 85.5 78.0 - 100.0 fL   MCH 27.1 26.0 - 34.0 pg   MCHC 31.7 30.0 - 36.0 g/dL   RDW 15.2 11.5 - 15.5 %   Platelets 392 150 - 400 K/uL    Comment: Performed at [facility], [place]  [address], [place], [place] [address]  Comprehensive metabolic panel     Status: None   Collection Time: 06/30/18  6:22 AM  Result Value Ref Range   Sodium 140 135 - 145 mmol/L   Potassium 3.8 3.5 - 5.1 mmol/L   Chloride 106 98 - 111 mmol/L    Comment: Please note change in reference range.   CO2 26 22 - 32 mmol/L   Glucose, Bld 95 70 - 99 mg/dL    Comment: Please note change in reference range.   BUN 10 6 - 20 mg/dL    Comment: Please note change in reference range.   Creatinine, Ser 0.73 0.44 - 1.00 mg/dL   Calcium 9.4 8.9 - 10.3 mg/dL   Total Protein 7.7 6.5 - 8.1 g/dL   Albumin 3.7 3.5 - 5.0 g/dL   AST 17 15 - 41 U/L   ALT 25 0 - 44 U/L    Comment: Please note change in reference range.   Alkaline Phosphatase 77 38 - 126 U/L   Total Bilirubin 0.4 0.3 - 1.2 mg/dL   GFR calc non Af Amer >60 >60 mL/min   GFR calc Af Amer >60 >60 mL/min    Comment: (NOTE) The eGFR has been calculated using the CKD EPI equation. This calculation has not been validated in all clinical situations. eGFR's persistently <60 mL/min signify possible Chronic Kidney Disease.    Anion gap 8 5 - 15    Comment: Performed at [facility], [place] [address] [place], [place],  [address]  Hemoglobin A1c  Status: None   Collection Time: 06/30/18  6:22 AM  Result Value Ref Range   Hgb A1c MFr Bld 5.4 4.8 - 5.6 %    Comment: (NOTE) Pre diabetes:          5.7%-6.4% Diabetes:              >6.4% Glycemic control for   <7.0% adults with diabetes    Mean Plasma Glucose 108.28 mg/dL    Comment: Performed at [place] [address], [place], [place] [address]  Lipid panel     Status: Abnormal   Collection Time: 06/30/18  6:22 AM  Result Value Ref Range   Cholesterol 230 (H) 0 - 200 mg/dL   Triglycerides 366 (H) <150 mg/dL   HDL 33 (L) >40 mg/dL   Total CHOL/HDL Ratio 7.0 RATIO   VLDL 73 (H) 0 - 40 mg/dL   LDL Cholesterol 124 (H) 0 - 99 mg/dL    Comment:        Total  Cholesterol/HDL:CHD Risk Coronary Heart Disease Risk Table                     Men   Women  1/2 Average Risk   3.4   3.3  Average Risk       5.0   4.4  2 X Average Risk   9.6   7.1  3 X Average Risk  23.4   11.0        Use the calculated Patient Ratio above and the CHD Risk Table to determine the patient's CHD Risk.        ATP III CLASSIFICATION (LDL):  <100     mg/dL   Optimal  100-129  mg/dL   Near or Above                    Optimal  130-159  mg/dL   Borderline  160-189  mg/dL   High  >190     mg/dL   Very High Performed at [place] [address], [place], [place] [address]   Lithium level     Status: Abnormal   Collection Time: 06/30/18  6:22 AM  Result Value Ref Range   Lithium Lvl 0.26 (L) 0.60 - 1.20 mmol/L    Comment: Performed at [facility], [place] [address], [place], [place] [address]  TSH     Status: Abnormal   Collection Time: 06/30/18  6:22 AM  Result Value Ref Range   TSH 5.778 (H) 0.350 - 4.500 uIU/mL    Comment: Performed by a 3rd Generation assay with a functional sensitivity of <=0.01 uIU/mL. Performed at [facility], [place] [address], [place], [place] [address]     Blood Alcohol level:  Lab Results  Component Value Date   ETH <10 01/18/2018   ETH <5 68/11/7516    Metabolic Disorder Labs: Lab Results  Component Value Date   HGBA1C 5.4 06/30/2018   MPG 108.28 06/30/2018   MPG 108.28 01/06/2018   No results found for: PROLACTIN Lab Results  Component Value Date   CHOL 230 (H) 06/30/2018   TRIG 366 (H) 06/30/2018   HDL 33 (L) 06/30/2018   CHOLHDL 7.0 06/30/2018   VLDL 73 (H) 06/30/2018   LDLCALC 124 (H) 06/30/2018   LDLCALC 158 (H) 01/06/2018    Physical Findings: AIMS: Facial and Oral Movements Muscles of Facial Expression: None, normal Lips and Perioral Area: None, normal  Jaw: None, normal Tongue: None, normal,Extremity Movements Upper (arms, wrists, hands, fingers): None,  normal Lower (legs, knees, ankles, toes): None, normal, Trunk Movements Neck, shoulders, hips: None, normal, Overall Severity Severity of abnormal movements (highest score from questions above): None, normal Incapacitation due to abnormal movements: None, normal Patient's awareness of abnormal movements (rate only patient's report): No Awareness, Dental Status Current problems with teeth and/or dentures?: No Does patient usually wear dentures?: No  CIWA:    COWS:     Musculoskeletal: Strength & Muscle Tone: within normal limits Gait & Station: normal Patient leans: N/A  Psychiatric Specialty Exam: Physical Exam  Nursing note and vitals reviewed. Constitutional: She is oriented to person, place, and time. She appears well-developed and well-nourished.  HENT:  Head: Normocephalic and atraumatic.  Respiratory: Effort normal.  Neurological: She is alert and oriented to person, place, and time.    ROS  Blood pressure 123/81, pulse 81, temperature 98.1 F (36.7 C), temperature source Oral, resp. rate 20, height _0  (1.676 m), weight (!) 137 kg (302 lb).Body mass index is 48.74 kg/m.  General Appearance: Casual  Eye Contact:  Fair  Speech:  Normal Rate  Volume:  Normal  Mood:  Anxious, Depressed and Irritable  Affect:  Congruent  Thought Process:  Coherent  Orientation:  Full (Time, Place, and Person)  Thought Content:  Logical  Suicidal Thoughts:  Yes.  without intent/plan  Homicidal Thoughts:  No  Memory:  Immediate;   Fair Recent;   Fair Remote;   Fair  Judgement:  Intact  Insight:  Lacking  Psychomotor Activity:  Increased  Concentration:  Concentration: Fair and Attention Span: Fair  Recall:  [employer] of Knowledge:  Fair  Language:  Fair  Akathisia:  Negative  Handed:  Right  AIMS (if indicated):     Assets:  Desire for Improvement Financial Resources/Insurance Housing Resilience Social Support  ADL's:  Intact  Cognition:  WNL  Sleep:  Number of Hours:  6.5     Treatment Plan Summary: Daily contact with patient to assess and evaluate symptoms and progress in treatment, Medication management and Plan Patient is seen and examined.  Patient is a 27 year old female with the above-stated past psychiatric history was seen in follow-up.  She is slightly improved from yesterday.  She still continues to be somewhat irritable.  She is more pleasant in her interactions today than yesterday.  She requested an increase in her hydroxyzine, not push that up to 50 mg every 6 hours.  She is back on her lithium CR 450 mg twice daily.  Her level drawn in the emergency room was 0.26.  We restarted her fluoxetine at 20 mg, and that can continue for several more days before increasing.  Her Metformin is the extended release 500 mg a day, and her blood sugar was 89 this morning.  She is tolerating the propranolol and Seroquel.  No other changes to her medications currently.  Hopefully she will still continue to slowly improve.  She will have her labs repeated in probably 2 to 3 days.  Sharma Covert, MD 06/30/2018, 1:46 PM

## 2018-06-30 NOTE — Progress Notes (Signed)
Urine cup given. Pending sample.

## 2018-06-30 NOTE — Progress Notes (Signed)
Recreation Therapy Notes  Date: 7.8.19 Time: 0930 Location: 300 [address] Dayroom  Group Topic: Stress Management  Goal Area(s) Addresses:  Patient will verbalize importance of using healthy stress management.  Patient will identify positive emotions associated with healthy stress management.   Intervention: Stress Management  Activity :  Meditation.  LRT introduced the stress management technique of meditation.  LRT played a meditation on choice.  Patients were to listen and follow along as meditation played.  Education:  Stress Management, Discharge Planning.   Education Outcome: Acknowledges edcuation/In group clarification offered/Needs additional education  Clinical Observations/Feedback:  Pt did not attend group.     Caroll RancherMarjette Billijo Dilling, LRT/CTRS         Lillia AbedLindsay, Syra Sirmons A 06/30/2018 11:53 AM
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
## 2018-06-30 NOTE — Plan of Care (Signed)
  Problem: Coping: Goal: Coping ability will improve Outcome: Not Progressing   Problem: Medication: Goal: Compliance with prescribed medication regimen will improve Outcome: Not Progressing   Problem: Self-Concept: Goal: Ability to disclose and discuss suicidal ideas will improve Outcome: Not Progressing Goal: Will verbalize positive feelings about self Outcome: Not Progressing   Problem: Education: Goal: Utilization of techniques to improve thought processes will improve Outcome: Not Progressing Goal: Knowledge of the prescribed therapeutic regimen will improve Outcome: Not Progressing   Problem: Activity: Goal: Interest or engagement in leisure activities will improve Outcome: Not Progressing   Problem: Health Behavior/Discharge Planning: Goal: Compliance with therapeutic regimen will improve Outcome: Not Progressing

## 2018-06-30 NOTE — Progress Notes (Signed)
D: Patient came up for medications at 930.  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  I did not refuse them.  I need something for anxiety."  I informed patient that the MHT attempted to get her up for medications and she refused.  She was given all medications except for neurontin (takes again at 1200).  Patient states, "I go to nursing school.  I know what you're supposed to do!"  Patient is tearful, sobbing.  She rates her depression, anxiety and hopelessness as a 10.  She denies any thoughts of self harm.  A: Continue to monitor.  R: Patient irritable.
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
## 2018-06-30 NOTE — B[facility] Suicide Risk Assessment (Signed)
[facility] INPATIENT:  Family/Significant Other Suicide Prevention Education  Suicide Prevention Education:  Patient Refusal for Family/Significant Other Suicide Prevention Education: The patient Veronica Waters has refused to provide written consent for family/significant other to be provided Family/Significant Other Suicide Prevention Education during admission and/or prior to discharge.  Physician notified.  SPE completed with pt, as pt refused to consent to family contact. SPI pamphlet provided to pt and pt was encouraged to share information with support network, ask questions, and talk about any concerns relating to SPE. Pt denies access to guns/firearms and verbalized understanding of information provided. Mobile Crisis information also provided to pt.   Rona RavensHeather S Newt Levingston LCSW 06/30/2018, 3:50 PM

## 2018-06-30 NOTE — Tx Team (Addendum)
Interdisciplinary Treatment and Diagnostic Plan Update  06/30/2018 Time of Session: [id number] Veronica Waters MRN: [id number]  Principal Diagnosis: Bipolar Disorder  Secondary Diagnoses: Active Problems:   Bipolar 1 disorder, mixed, severe (HCC)   Posttraumatic stress disorder   Borderline personality disorder (HCC)   Diabetes mellitus type 2 in obese (HCC)   Current Medications:  Current Facility-Administered Medications  Medication Dose Route Frequency Provider Last Rate Last Dose  . albuterol (PROVENTIL HFA;VENTOLIN HFA) 108 (90 Base) MCG/ACT inhaler 2 puff  2 puff Inhalation Q6H PRN Antonieta Pertlary, Greg Lawson, MD      . alum & mag hydroxide-simeth (MAALOX/MYLANTA) 200-200-20 MG/5ML suspension 30 mL  30 mL Oral Q4H PRN Donell SievertSimon, Spencer E, PA-C   30 mL at 06/29/18 2056  . FLUoxetine (PROZAC) capsule 10 mg  10 mg Oral Daily Cobos, Rockey SituFernando A, MD   10 mg at 07/04/18 0819  . gabapentin (NEURONTIN) capsule 400 mg  400 mg Oral TID Cobos, Rockey SituFernando A, MD   400 mg at 07/04/18 0820  . levothyroxine (SYNTHROID, LEVOTHROID) tablet 50 mcg  50 mcg Oral QAC breakfast Cobos, Rockey SituFernando A, MD   50 mcg at 07/04/18 0636  . lisinopril (PRINIVIL,ZESTRIL) tablet 10 mg  10 mg Oral Daily Cobos, Rockey SituFernando A, MD   10 mg at 07/04/18 0819  . lithium carbonate (LITHOBID) CR tablet 600 mg  600 mg Oral Q12H Cobos, Rockey SituFernando A, MD   600 mg at 07/04/18 0819  . LORazepam (ATIVAN) tablet 0.5 mg  0.5 mg Oral Q6H PRN Cobos, Rockey SituFernando A, MD   0.5 mg at 07/04/18 0827  . magnesium hydroxide (MILK OF MAGNESIA) suspension 30 mL  30 mL Oral Daily PRN Kerry HoughSimon, Spencer E, PA-C      . metFORMIN (GLUCOPHAGE-XR) 24 hr tablet 500 mg  500 mg Oral Q breakfast Antonieta Pertlary, Greg Lawson, MD   500 mg at 07/04/18 0819  . propranolol (INDERAL) tablet 20 mg  20 mg Oral BID Antonieta Pertlary, Greg Lawson, MD   20 mg at 07/04/18 0819  . QUEtiapine (SEROQUEL) tablet 200 mg  200 mg Oral QHS Cobos, Rockey SituFernando A, MD   200 mg at 07/03/18 2144   PTA Medications: Medications Prior to  Admission  Medication Sig Dispense Refill Last Dose  . albuterol (PROVENTIL HFA;VENTOLIN HFA) 108 (90 Base) MCG/ACT inhaler Inhale 1-2 puffs into the lungs every 6 (six) hours as needed for wheezing or shortness of breath. 1 Inhaler 0 Past Month at Unknown time  . clonazePAM (KLONOPIN) 0.5 MG tablet Take 0.5 mg by mouth 3 (three) times daily as needed for anxiety.   Past Month at Unknown time  . FLUoxetine (PROZAC) 20 MG capsule Take 20 mg by mouth daily.   Past Month at Unknown time  . gabapentin (NEURONTIN) 300 MG capsule Take 600 mg by mouth 3 (three) times daily.   Past Month at Unknown time  . lithium carbonate (ESKALITH) 450 MG CR tablet Take by mouth 2 (two) times daily.   Past Month at Unknown time  . metFORMIN (GLUCOPHAGE-XR) 500 MG 24 hr tablet Take 500 mg by mouth daily with breakfast.   Past Month at Unknown time  . naltrexone (DEPADE) 50 MG tablet Take 50 mg by mouth daily.   Past Month at Unknown time  . propranolol (INDERAL) 20 MG tablet Take 20 mg by mouth 2 (two) times daily.   Past Month at Unknown time  . QUEtiapine (SEROQUEL) 50 MG tablet Take 50 mg by mouth at bedtime.   Past Month at Unknown  time    Patient Stressors: Marital or family conflict Medication change or noncompliance Traumatic event  Patient Strengths: Active sense of humor Average or above average intelligence Capable of independent living Communication skills Financial means Supportive family/friends Work skills  Treatment Modalities: Medication Management, Group therapy, Case management,  1 to 1 session with clinician, Psychoeducation, Recreational therapy.   Physician Treatment Plan for Primary Diagnosis: Bipolar Disorder Long Term Goal(s): Improvement in symptoms so as ready for discharge Improvement in symptoms so as ready for discharge   Short Term Goals: Ability to identify changes in lifestyle to reduce recurrence of condition will improve Ability to verbalize feelings will improve Ability  to disclose and discuss suicidal ideas Ability to demonstrate self-control will improve Ability to identify and develop effective coping behaviors will improve Ability to maintain clinical measurements within normal limits will improve Compliance with prescribed medications will improve Ability to identify changes in lifestyle to reduce recurrence of condition will improve Ability to verbalize feelings will improve Ability to disclose and discuss suicidal ideas Ability to demonstrate self-control will improve Ability to identify and develop effective coping behaviors will improve Ability to maintain clinical measurements within normal limits will improve Compliance with prescribed medications will improve  Medication Management: Evaluate patient's response, side effects, and tolerance of medication regimen.  Therapeutic Interventions: 1 to 1 sessions, Unit Group sessions and Medication administration.  Evaluation of Outcomes: Progressing  Physician Treatment Plan for Secondary Diagnosis: Active Problems:   Bipolar 1 disorder, mixed, severe (HCC)   Posttraumatic stress disorder   Borderline personality disorder (HCC)   Diabetes mellitus type 2 in obese (HCC)  Long Term Goal(s): Improvement in symptoms so as ready for discharge Improvement in symptoms so as ready for discharge   Short Term Goals: Ability to identify changes in lifestyle to reduce recurrence of condition will improve Ability to verbalize feelings will improve Ability to disclose and discuss suicidal ideas Ability to demonstrate self-control will improve Ability to identify and develop effective coping behaviors will improve Ability to maintain clinical measurements within normal limits will improve Compliance with prescribed medications will improve Ability to identify changes in lifestyle to reduce recurrence of condition will improve Ability to verbalize feelings will improve Ability to disclose and discuss  suicidal ideas Ability to demonstrate self-control will improve Ability to identify and develop effective coping behaviors will improve Ability to maintain clinical measurements within normal limits will improve Compliance with prescribed medications will improve     Medication Management: Evaluate patient's response, side effects, and tolerance of medication regimen.  Therapeutic Interventions: 1 to 1 sessions, Unit Group sessions and Medication administration.  Evaluation of Outcomes: Progressing   RN Treatment Plan for Primary Diagnosis: Bipolar Disorder Long Term Goal(s): Knowledge of disease and therapeutic regimen to maintain health will improve  Short Term Goals: Ability to remain free from injury will improve, Ability to verbalize frustration and anger appropriately will improve, Ability to demonstrate self-control and Ability to participate in decision making will improve  Medication Management: RN will administer medications as ordered by provider, will assess and evaluate patient's response and provide education to patient for prescribed medication. RN will report any adverse and/or side effects to prescribing provider.  Therapeutic Interventions: 1 on 1 counseling sessions, Psychoeducation, Medication administration, Evaluate responses to treatment, Monitor vital signs and CBGs as ordered, Perform/monitor CIWA, COWS, AIMS and Fall Risk screenings as ordered, Perform wound care treatments as ordered.  Evaluation of Outcomes: Progressing   LCSW Treatment Plan for Primary Diagnosis: Bipolar  Disorder Long Term Goal(s): Safe transition to appropriate next level of care at discharge, Engage patient in therapeutic group addressing interpersonal concerns.  Short Term Goals: Engage patient in aftercare planning with referrals and resources, Increase social support and Increase emotional regulation  Therapeutic Interventions: Assess for all discharge needs, 1 to 1 time with Social  worker, Explore available resources and support systems, Assess for adequacy in community support network, Educate family and significant other(s) on suicide prevention, Complete Psychosocial Assessment, Interpersonal group therapy.  Evaluation of Outcomes: Progressing   Progress in Treatment: Attending groups: Yes. Participating in groups: Yes. Taking medication as prescribed: Yes. Toleration medication: Yes. Family/Significant other contact made: No, will contact:  family member if pt consents to collateral contact.  Patient understands diagnosis: Yes. Discussing patient identified problems/goals with staff: Yes. Medical problems stabilized or resolved: Yes. Denies suicidal/homicidal ideation: Yes. Issues/concerns per patient self-inventory: No. Other: n/a   New problem(s) identified: No, Describe:  n/a  New Short Term/Long Term Goal(s): , medication management for mood stabilization; elimination of SI thoughts; development of comprehensive mental wellness/sobriety plan.   Patient Goals:  "to get my medication straight and actually stay on them."   Discharge Plan or Barriers: CSW assessing for appropriate referrals. [facility] pamphlet, Mobile Crisis information,  information provided to patient for additional community support and resources.   Reason for Continuation of Hospitalization: Anxiety Depression Medication stabilization Suicidal ideation  Estimated Length of Stay: 1-3 days.  Attendees: Patient: 07/04/2018   Physician: Dr Jama Flavors, MD 07/04/2018   Nursing: Joslyn Devon, RN 07/04/2018   RN Care Manager: 07/04/2018   Social Worker: Daleen Squibb, LCSW 07/04/2018   Recreational Therapist:  07/04/2018   Other:  07/04/2018   Other:  07/04/2018   Other: 07/04/2018        Scribe for Treatment Team: Rona Ravens, LCSW 06/30/2018 12:20 PM

## 2018-06-30 NOTE — Progress Notes (Signed)
Pt approach Clinical research associatewriter requesting medication for anxiety. Pt was informed that she had her last PRN of vistaril about an hour ago. Pt appears agitated/irritable at this time. Pt states "I've taken klonopin in the past but I'm trying to stay away from that; maybe haldol?". Provider on call notified. See MAR. Will continue with POC.

## 2018-07-01 LAB — PROLACTIN: Prolactin: 23.5 ng/mL — ABNORMAL HIGH (ref 4.8–23.3)

## 2018-07-01 LAB — GLUCOSE, CAPILLARY: Glucose-Capillary: 90 mg/dL (ref 70–99)

## 2018-07-01 MED ORDER — QUETIAPINE FUMARATE 25 MG PO TABS
25.0000 mg | ORAL_TABLET | Freq: Two times a day (BID) | ORAL | Status: DC
  Administered 2018-07-01 – 2018-07-03 (×4): 25 mg via ORAL
  Filled 2018-07-01 (×8): qty 1

## 2018-07-01 MED ORDER — LORAZEPAM 0.5 MG PO TABS
0.5000 mg | ORAL_TABLET | Freq: Four times a day (QID) | ORAL | Status: DC | PRN
  Administered 2018-07-01 – 2018-07-04 (×8): 0.5 mg via ORAL
  Filled 2018-07-01 (×8): qty 1

## 2018-07-01 NOTE — B[facility] Group Notes (Signed)
LCSW Group Therapy Note 07/01/2018 11:26 AM  Type of Therapy and Topic: Group Therapy: Overcoming Obstacles  Participation Level: Did Not Attend  Description of Group:  In this group patients will be encouraged to explore what they see as obstacles to their own wellness and recovery. They will be guided to discuss their thoughts, feelings, and behaviors related to these obstacles. The group will process together ways to cope with barriers, with attention given to specific choices patients can make. Each patient will be challenged to identify changes they are motivated to make in order to overcome their obstacles. This group will be process-oriented, with patients participating in exploration of their own experiences as well as giving and receiving support and challenge from other group members.  Therapeutic Goals: 1. Patient will identify personal and current obstacles as they relate to admission. 2. Patient will identify barriers that currently interfere with their wellness or overcoming obstacles.  3. Patient will identify feelings, thought process and behaviors related to these barriers. 4. Patient will identify two changes they are willing to make to overcome these obstacles:   Summary of Patient Progress  Invited, chose not to attend.    Therapeutic Modalities:  Cognitive Behavioral Therapy Solution Focused Therapy Motivational Interviewing Relapse Prevention Therapy   [name] LCSWA Clinical Social Worker   

## 2018-07-01 NOTE — Progress Notes (Signed)
Patient ID: Veronica Waters, female   DOB: 06-27-1991, 27 y.o.   MRN: [id number] D) Pt has been labile, attention seeking. Pt is negative for groups and activities despite prompting however goes to cafeteria for meals. Pt gamey and manipulative. Pt endorses passive s.i. Contracts for safety. A) Level 3 obs for safety. Support and encouragement provided. Prompting as needed. Med ed reinforced. Verbally contract for safety. R) labile.

## 2018-07-01 NOTE — Progress Notes (Signed)
[facility] MD Progress Note  07/01/2018 12:36 PM Veronica Waters  MRN:  [id number] Subjective:  Patient reports " I still feel bad" , and describes ongoing anxiety and depression. States " I feel like my mind is still racing". Describes intermittent passive SI,but presents future oriented, and states " I need to leave the hospital soon, because I have got to get back to work".  States medications only partially effective, and states she is experiencing some nausea, no vomiting . Objective : I have reviewed chart notes and have met with patient . Patient is a 27 year old female with a past psychiatric history significant for bipolar disorder, posttraumatic stress disorder, borderline personality disorder . She had stopped  taking her medicines several weeks ago.  Reports " I was doing better on the medications, more stable, but then I found out my grandmother had died in 2023-03-02 and everything kind of went downhill". At this time presents alert, attentive, feels subjectively agitated but no psychomotor agitation. Denies suicidal plan or intention, describes long history of passive suicidal ideations. As above, describes lingering anxiety, depression, and states she feels medications are " still not working right ". States she does not feel Vistaril is very effective to address her anxiety. Some group attendance, cooperative on approach.  Labs reviewed as below- 7/8 Li level subtherapeutic at 0.26 ( patient had not been taking the Li regularly  prior to admission). TSH mildly elevated at 5.77 . 7/8 pregnancy test negative Principal Problem:  Bipolar Disorder, PTSD by history  Diagnosis:   Patient Active Problem List   Diagnosis Date Noted  . Posttraumatic stress disorder [F43.10]   . Borderline personality disorder ([place]) [F60.3]   . Diabetes mellitus type 2 in obese (HCC) [E11.69, E66.9]   . Bipolar 1 disorder, mixed, severe ([place]) [F31.63] 06/28/2018  . Tachycardia [R00.0] 01/18/2018  . Severe recurrent major  depression without psychotic features ([place]) [F33.2] 01/05/2018  . MDD (major depressive disorder), recurrent severe, without psychosis ([place]) [F33.2] 09/01/2017   Total Time spent with patient: 20 minutes  Past Psychiatric History: See admission H&P  Past Medical History:  Past Medical History:  Diagnosis Date  . Asthma   . Hypertension    History reviewed. No pertinent surgical history. Family History:  Family History  Family history unknown: Yes   Family Psychiatric  History: See admission H&P Social History:  Social History   Substance and Sexual Activity  Alcohol Use No     Social History   Substance and Sexual Activity  Drug Use No    Social History   Socioeconomic History  . Marital status: Single    Spouse name: Not on file  . Number of children: Not on file  . Years of education: Not on file  . Highest education level: Not on file  Occupational History  . Not on file  Social Needs  . Financial resource strain: Not on file  . Food insecurity:    Worry: Not on file    Inability: Not on file  . Transportation needs:    Medical: Not on file    Non-medical: Not on file  Tobacco Use  . Smoking status: Never Smoker  . Smokeless tobacco: Never Used  Substance and Sexual Activity  . Alcohol use: No  . Drug use: No  . Sexual activity: Never    Birth control/protection: Other-see comments    Comment: Rod in left arm for birth control  Lifestyle  . Physical activity:    Days per week: Not  on file    Minutes per session: Not on file  . Stress: Not on file  Relationships  . Social connections:    Talks on phone: Not on file    Gets together: Not on file    Attends religious service: Not on file    Active member of club or organization: Not on file    Attends meetings of clubs or organizations: Not on file    Relationship status: Not on file  Other Topics Concern  . Not on file  Social History Narrative  . Not on file   Additional Social History:     Pain Medications: See MAR Prescriptions: See MAR Over the Counter: See MAR History of alcohol / drug use?: Yes(Pt reports a history of using a variety of drugs in the past.) Longest period of sobriety (when/how long): 4 years currently Negative Consequences of Use: Financial, Legal  Sleep: improving   Appetite:  Fair  Current Medications: Current Facility-Administered Medications  Medication Dose Route Frequency Provider Last Rate Last Dose  . albuterol (PROVENTIL HFA;VENTOLIN HFA) 108 (90 Base) MCG/ACT inhaler 2 puff  2 puff Inhalation Q6H PRN Sharma Covert, MD      . alum & mag hydroxide-simeth (MAALOX/MYLANTA) 200-200-20 MG/5ML suspension 30 mL  30 mL Oral Q4H PRN Patriciaann Clan E, PA-C   30 mL at 06/29/18 2056  . FLUoxetine (PROZAC) capsule 20 mg  20 mg Oral Daily Sharma Covert, MD   20 mg at 07/01/18 0835  . gabapentin (NEURONTIN) tablet 600 mg  600 mg Oral TID Sharma Covert, MD   600 mg at 07/01/18 1204  . hydrOXYzine (ATARAX/VISTARIL) tablet 50 mg  50 mg Oral Q6H PRN Sharma Covert, MD   50 mg at 07/01/18 1011  . lithium carbonate (ESKALITH) CR tablet 450 mg  450 mg Oral Q12H Sharma Covert, MD   450 mg at 07/01/18 0835  . magnesium hydroxide (MILK OF MAGNESIA) suspension 30 mL  30 mL Oral Daily PRN Laverle Hobby, PA-C      . metFORMIN (GLUCOPHAGE-XR) 24 hr tablet 500 mg  500 mg Oral Q breakfast Sharma Covert, MD   500 mg at 07/01/18 0835  . propranolol (INDERAL) tablet 20 mg  20 mg Oral BID Sharma Covert, MD   20 mg at 07/01/18 0835  . QUEtiapine (SEROQUEL) tablet 100 mg  100 mg Oral QHS Sharma Covert, MD   100 mg at 06/30/18 2053    Lab Results:  Results for orders placed or performed during the hospital encounter of 06/28/18 (from the past 48 hour(s))  Glucose, capillary     Status: None   Collection Time: 06/30/18  5:40 AM  Result Value Ref Range   Glucose-Capillary 84 70 - 99 mg/dL   Comment 1 Notify RN    Comment 2 Document in  Chart   CBC     Status: None   Collection Time: 06/30/18  6:22 AM  Result Value Ref Range   WBC 7.6 4.0 - 10.5 K/uL   RBC 4.90 3.87 - 5.11 MIL/uL   Hemoglobin 13.3 12.0 - 15.0 g/dL   HCT 41.9 36.0 - 46.0 %   MCV 85.5 78.0 - 100.0 fL   MCH 27.1 26.0 - 34.0 pg   MCHC 31.7 30.0 - 36.0 g/dL   RDW 15.2 11.5 - 15.5 %   Platelets 392 150 - 400 K/uL    Comment: Performed at [facility], [place]  [address] [place], [place] [address]  Comprehensive metabolic panel     Status: None   Collection Time: 06/30/18  6:22 AM  Result Value Ref Range   Sodium 140 135 - 145 mmol/L   Potassium 3.8 3.5 - 5.1 mmol/L   Chloride 106 98 - 111 mmol/L    Comment: Please note change in reference range.   CO2 26 22 - 32 mmol/L   Glucose, Bld 95 70 - 99 mg/dL    Comment: Please note change in reference range.   BUN 10 6 - 20 mg/dL    Comment: Please note change in reference range.   Creatinine, Ser 0.73 0.44 - 1.00 mg/dL   Calcium 9.4 8.9 - 10.3 mg/dL   Total Protein 7.7 6.5 - 8.1 g/dL   Albumin 3.7 3.5 - 5.0 g/dL   AST 17 15 - 41 U/L   ALT 25 0 - 44 U/L    Comment: Please note change in reference range.   Alkaline Phosphatase 77 38 - 126 U/L   Total Bilirubin 0.4 0.3 - 1.2 mg/dL   GFR calc non Af Amer >60 >60 mL/min   GFR calc Af Amer >60 >60 mL/min    Comment: (NOTE) The eGFR has been calculated using the CKD EPI equation. This calculation has not been validated in all clinical situations. eGFR's persistently <60 mL/min signify possible Chronic Kidney Disease.    Anion gap 8 5 - 15    Comment: Performed at [facility], [place] [address] [place],  [address]  Hemoglobin A1c     Status: None   Collection Time: 06/30/18  6:22 AM  Result Value Ref Range   Hgb A1c MFr Bld 5.4 4.8 - 5.6 %    Comment: (NOTE) Pre diabetes:          5.7%-6.4% Diabetes:              >6.4% Glycemic control for   <7.0% adults with diabetes    Mean Plasma Glucose 108.28 mg/dL     Comment: Performed at [facility] [address], [place],  [address]  Lipid panel     Status: Abnormal   Collection Time: 06/30/18  6:22 AM  Result Value Ref Range   Cholesterol 230 (H) 0 - 200 mg/dL   Triglycerides 366 (H) <150 mg/dL   HDL 33 (L) >40 mg/dL   Total CHOL/HDL Ratio 7.0 RATIO   VLDL 73 (H) 0 - 40 mg/dL   LDL Cholesterol 124 (H) 0 - 99 mg/dL    Comment:        Total Cholesterol/HDL:CHD Risk Coronary Heart Disease Risk Table                     Men   Women  1/2 Average Risk   3.4   3.3  Average Risk       5.0   4.4  2 X Average Risk   9.6   7.1  3 X Average Risk  23.4   11.0        Use the calculated Patient Ratio above and the CHD Risk Table to determine the patient's CHD Risk.        ATP III CLASSIFICATION (LDL):  <100     mg/dL   Optimal  100-129  mg/dL   Near or Above                    Optimal  130-159  mg/dL  Borderline  160-189  mg/dL   High  >190     mg/dL   Very High Performed at [facility] [address] [place], [place] [address]   Lithium level     Status: Abnormal   Collection Time: 06/30/18  6:22 AM  Result Value Ref Range   Lithium Lvl 0.26 (L) 0.60 - 1.20 mmol/L    Comment: Performed at [facility], [place] [address], [place], [place] [address]  Prolactin     Status: Abnormal   Collection Time: 06/30/18  6:22 AM  Result Value Ref Range   Prolactin 23.5 (H) 4.8 - 23.3 ng/mL    Comment: (NOTE) Performed At: [facility] [place], [place] [id number] Rush Farmer MD [id number] Performed at [facility], [place] [address], [place],  [address]   TSH     Status: Abnormal   Collection Time: 06/30/18  6:22 AM  Result Value Ref Range   TSH 5.778 (H) 0.350 - 4.500 uIU/mL    Comment: Performed by a 3rd Generation assay with a functional sensitivity of <=0.01 uIU/mL. Performed at [facility], [place] [address],  [place], [place] [address]   Glucose, capillary     Status: None   Collection Time: 07/01/18  6:05 AM  Result Value Ref Range   Glucose-Capillary 90 70 - 99 mg/dL    Blood Alcohol level:  Lab Results  Component Value Date   ETH <10 01/18/2018   ETH <5 77/02/4034    Metabolic Disorder Labs: Lab Results  Component Value Date   HGBA1C 5.4 06/30/2018   MPG 108.28 06/30/2018   MPG 108.28 01/06/2018   Lab Results  Component Value Date   PROLACTIN 23.5 (H) 06/30/2018   Lab Results  Component Value Date   CHOL 230 (H) 06/30/2018   TRIG 366 (H) 06/30/2018   HDL 33 (L) 06/30/2018   CHOLHDL 7.0 06/30/2018   VLDL 73 (H) 06/30/2018   LDLCALC 124 (H) 06/30/2018   LDLCALC 158 (H) 01/06/2018    Physical Findings: AIMS: Facial and Oral Movements Muscles of Facial Expression: None, normal Lips and Perioral Area: None, normal Jaw: None, normal Tongue: None, normal,Extremity Movements Upper (arms, wrists, hands, fingers): None, normal Lower (legs, knees, ankles, toes): None, normal, Trunk Movements Neck, shoulders, hips: None, normal, Overall Severity Severity of abnormal movements (highest score from questions above): None, normal Incapacitation due to abnormal movements: None, normal Patient's awareness of abnormal movements (rate only patient's report): No Awareness, Dental Status Current problems with teeth and/or dentures?: No Does patient usually wear dentures?: No  CIWA:    COWS:     Musculoskeletal: Strength & Muscle Tone: within normal limits Gait & Station: normal Patient leans: N/A  Psychiatric Specialty Exam: Physical Exam  Nursing note and vitals reviewed. Constitutional: She is oriented to person, place, and time. She appears well-developed and well-nourished.  HENT:  Head: Normocephalic and atraumatic.  Respiratory: Effort normal.  Neurological: She is alert and oriented to person, place, and time.    ROS no headache, no chest pain, no shortness of breath, (+)  nausea, no vomiting, no diarrhea, no rash   Blood pressure (!) 143/96, pulse 68, temperature 98.1 F (36.7 C), temperature source Oral, resp. rate 20, height '5\' 6"'$  (1.676 m), weight (!) 137 kg (302 lb).Body mass index is 48.74 kg/m.  General Appearance: Fairly Groomed  Eye Contact:  Fair- improves as session progresses   Speech:  Normal Rate  Volume:  Normal  Mood:  Anxious, Depressed and Irritable  Affect:  vaguely dysphoric, tends to improve partially during session  Thought Process:  Linear and Descriptions of Associations: Intact  Orientation:  Other:  fully alert and attentive  Thought Content:  no hallucinations, no delusions , not internally preoccupied   Suicidal Thoughts:  Yes.  without intent/plan- denies current plan or intention , contracts for safety on unit  Homicidal Thoughts:  No  Memory:  recent and remote grossly intact   Judgement:  Fair  Insight:  Fair  Psychomotor Activity:  Normal  Concentration:  Concentration: Good and Attention Span: Good  Recall:  Good  Fund of Knowledge:  Good  Language:  Good  Akathisia:  Negative  Handed:  Right  AIMS (if indicated):     Assets:  Desire for Improvement Financial Resources/Insurance Housing Resilience Social Support  ADL's:  Intact  Cognition:  WNL  Sleep:  Number of Hours: 6   Assessment - 27 year old female , history of Mood Disorder , presented due to worsening mood , suicidal ideations, increased anxiety, medication non compliance x 3 weeks ( was on Li, Prozac, Neurontin, Propranolol). 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 (* Currently does not endorse or present with symptoms of Serotonin Syndrome .). Reports partial improvement but states she still feels " moody, agitated , anxious". Describes history of chronic, frequent passive SI, but is currently future oriented, wanting to return to work soon. Reports Vistaril PRNs not working well for her anxiety, states that in the past low dose Seroquel  during the day was helpful and well tolerated . " I feel I was doing a lot better with that".     Treatment Plan Summary: Treatment plan reviewed as below today 7/9 Encourage group and milieu participation  Treatment Team working on disposition planning options  Continue Prozac 20 mgrs QDAY for depression, anxiety Continue Neurontin 600 mgrs TID for anxiety Continue Lithium 450 mgrs BID for mood disorder  Increase Seroquel to 25 mgrs BID and 100 mgrs QHS for mood disorder, anxiety Continue Propranolol 20 mgrs BID for anxiety, BP  D/C Vistaril as she states she does not like how it makes her feel and does not help her anxiety much  Follow up on elevated TSH with FT3, FT4  Jenne Campus, MD 07/01/2018, 12:36 PM   Patient ID: Terrill Mohr, female   DOB: 10-22-1991, 28 y.o.   MRN: [id number]

## 2018-07-02 LAB — GLUCOSE, CAPILLARY: Glucose-Capillary: 82 mg/dL (ref 70–99)

## 2018-07-02 LAB — T4, FREE: FREE T4: 0.75 ng/dL — AB (ref 0.82–1.77)

## 2018-07-02 MED ORDER — LISINOPRIL 5 MG PO TABS
5.0000 mg | ORAL_TABLET | Freq: Every day | ORAL | Status: DC
  Administered 2018-07-02 – 2018-07-03 (×2): 5 mg via ORAL
  Filled 2018-07-02 (×4): qty 1

## 2018-07-02 MED ORDER — QUETIAPINE FUMARATE 50 MG PO TABS
150.0000 mg | ORAL_TABLET | Freq: Every day | ORAL | Status: DC
  Administered 2018-07-02: 150 mg via ORAL
  Filled 2018-07-02 (×4): qty 1

## 2018-07-02 MED ORDER — LEVOTHYROXINE SODIUM 50 MCG PO TABS
50.0000 ug | ORAL_TABLET | Freq: Every day | ORAL | Status: DC
  Administered 2018-07-03 – 2018-07-05 (×3): 50 ug via ORAL
  Filled 2018-07-02: qty 1
  Filled 2018-07-02: qty 7
  Filled 2018-07-02: qty 1
  Filled 2018-07-02: qty 2
  Filled 2018-07-02: qty 1
  Filled 2018-07-02: qty 2
  Filled 2018-07-02: qty 1

## 2018-07-02 NOTE — Progress Notes (Signed)
Pt requested tylenol for a headache. Writer informed pt that tylenol was not ordered. Pt stated, "why because I overdose on it". And then began to laugh.  Writer notified Dr. Jama Flavorsobos of pt's request. Dr. Jama Flavorsobos spoke with pt briefly in regards to pt's allergies. Motrin and Tylenol contraindicated when ordering medications. Pt encouraged to increase fluid intake and rest. Pt refused to rest and was observed talking and engaging with peers in the dayroom.

## 2018-07-02 NOTE — Progress Notes (Signed)
Recreation Therapy Notes  Date: 7.10.19 Time: 0930 Location: [address] Dayroom  Group Topic: Stress Management  Goal Area(s) Addresses:  Patient will verbalize importance of using healthy stress management.  Patient will identify positive emotions associated with healthy stress management.   Intervention: Stress Management  Activity :  Guided Imagery.  LRT introduced the stress management technique of guided imagery.  LRT read a script to allow patients to visualize being outside on a bright summer day.  Patients were to follow along as script was read.  Education:  Stress Management, Discharge Planning.   Education Outcome: Acknowledges edcuation/In group clarification offered/Needs additional education  Clinical Observations/Feedback: Pt did not attend group.    Caroll RancherMarjette Rayneisha Bouza, LRT/CTRS         Caroll RancherLindsay, Audre Cenci A 07/02/2018 12:01 PM

## 2018-07-02 NOTE — Progress Notes (Signed)
Pt did not attend wrap up group this evening.  

## 2018-07-02 NOTE — Progress Notes (Signed)
Pt presents with a flat affect and depressed mood. Pt reports increased depression 10/10 and anxiety 10/10 on her self inventory sheet. Pt endorses passive SI with no plan or intent. Pt reports fiar sleep at bedtime. Pt noted to be withdrawn and isolative to her room throughout the day. Pt guarded during shift assessment and forwarded little information. Pt compliant with taking meds and denies any side effects.  Orders reviewed with pt. Ativan administered to pt at her request for increased anxiety level. Verbal support provided. Pt highly encouraged to attend groups and identify coping skills for depression. Environmental checks performed every shift for safety. 15 minute checks performed for safety.  Pt compliant with tx plan. Pt stated goal "try to come to groups".

## 2018-07-02 NOTE — Progress Notes (Signed)
D: Patient endorses passive SI but verbally contracts for safety, she denies HI or AVH. Patient is flat, depressed and irritable on approach stating, "everything is bothering me, I'm tired of feeling this way".  Pt. Is isolative this evening and did not attend wrap up group.  Pt. Is visualized talking on the phone and in her room laughing with with her roommate.  Pt. Denies any physical complaints.   A: Patient given emotional support from RN. Patient encouraged to come to staff with concerns and/or questions. Patient's medication routine continued. Patient's orders and plan of care reviewed.   R: Patient remains appropriate and cooperative. Will continue to monitor patient q15 minutes for safety.

## 2018-07-02 NOTE — Progress Notes (Signed)
[facility] MD Progress Note  07/02/2018 6:17 PM Veronica Waters  MRN:  [id number] Subjective: Patient reports lingering depression, anxiety, states she feels "a little better".  Denies suicidal ideations at this time. Denies medication side effects. Objective : I have reviewed chart notes and have met with patient . Patient is a 27 year old female with a past psychiatric history significant for bipolar disorder, posttraumatic stress disorder, borderline personality disorder . She had stopped  taking her medicines several weeks ago.  Patient reports partial improvement compared to how she felt prior to admission but describes ongoing depression and anxiety, and has continued to score her depression and is severe (10/10).  No disruptive or agitated behaviors, slightly irritable at times, visible in dayroom at times but remains isolative. She denies medication side effects. Labs reviewed- TSH is mildly elevated and  FT4 is decreased at 0.75.  Herblood pressure has remained consistently elevated in spite of Propranolol, which she states she was prescribed for both BP and anxiety.  I have discussed case with hospitalist consultant -recommendation is to add lisinopril 5 mg daily.  We will also start Synthroid 50 mcg daily.  Principal Problem:  Bipolar Disorder, PTSD by history  Diagnosis:   Patient Active Problem List   Diagnosis Date Noted  . Posttraumatic stress disorder [F43.10]   . Borderline personality disorder ([place]) [F60.3]   . Diabetes mellitus type 2 in obese (HCC) [E11.69, E66.9]   . Bipolar 1 disorder, mixed, severe ([place]) [F31.63] 06/28/2018  . Tachycardia [R00.0] 01/18/2018  . Severe recurrent major depression without psychotic features ([place]) [F33.2] 01/05/2018  . MDD (major depressive disorder), recurrent severe, without psychosis ([place]) [F33.2] 09/01/2017   Total Time spent with patient: 20 minutes  Past Psychiatric History: See admission H&P  Past Medical History:  Past Medical History:   Diagnosis Date  . Asthma   . Hypertension    History reviewed. No pertinent surgical history. Family History:  Family History  Family history unknown: Yes   Family Psychiatric  History: See admission H&P Social History:  Social History   Substance and Sexual Activity  Alcohol Use No     Social History   Substance and Sexual Activity  Drug Use No    Social History   Socioeconomic History  . Marital status: Single    Spouse name: Not on file  . Number of children: Not on file  . Years of education: Not on file  . Highest education level: Not on file  Occupational History  . Not on file  Social Needs  . Financial resource strain: Not on file  . Food insecurity:    Worry: Not on file    Inability: Not on file  . Transportation needs:    Medical: Not on file    Non-medical: Not on file  Tobacco Use  . Smoking status: Never Smoker  . Smokeless tobacco: Never Used  Substance and Sexual Activity  . Alcohol use: No  . Drug use: No  . Sexual activity: Never    Birth control/protection: Other-see comments    Comment: Rod in left arm for birth control  Lifestyle  . Physical activity:    Days per week: Not on file    Minutes per session: Not on file  . Stress: Not on file  Relationships  . Social connections:    Talks on phone: Not on file    Gets together: Not on file    Attends religious service: Not on file    Active member of club  or organization: Not on file    Attends meetings of clubs or organizations: Not on file    Relationship status: Not on file  Other Topics Concern  . Not on file  Social History Narrative  . Not on file   Additional Social History:    Pain Medications: See MAR Prescriptions: See MAR Over the Counter: See MAR History of alcohol / drug use?: Yes(Pt reports a history of using a variety of drugs in the past.) Longest period of sobriety (when/how long): 4 years currently Negative Consequences of Use: Financial, Legal  Sleep:  improving   Appetite:  Fair  Current Medications: Current Facility-Administered Medications  Medication Dose Route Frequency Provider Last Rate Last Dose  . albuterol (PROVENTIL HFA;VENTOLIN HFA) 108 (90 Base) MCG/ACT inhaler 2 puff  2 puff Inhalation Q6H PRN Sharma Covert, MD      . alum & mag hydroxide-simeth (MAALOX/MYLANTA) 200-200-20 MG/5ML suspension 30 mL  30 mL Oral Q4H PRN Patriciaann Clan E, PA-C   30 mL at 06/29/18 2056  . FLUoxetine (PROZAC) capsule 20 mg  20 mg Oral Daily Sharma Covert, MD   20 mg at 07/02/18 0805  . gabapentin (NEURONTIN) tablet 600 mg  600 mg Oral TID Sharma Covert, MD   600 mg at 07/02/18 1708  . [START ON 07/03/2018] levothyroxine (SYNTHROID, LEVOTHROID) tablet 50 mcg  50 mcg Oral QAC breakfast Salaya Holtrop A, MD      . lisinopril (PRINIVIL,ZESTRIL) tablet 5 mg  5 mg Oral Daily Christie Copley, Myer Peer, MD   5 mg at 07/02/18 1723  . lithium carbonate (ESKALITH) CR tablet 450 mg  450 mg Oral Q12H Sharma Covert, MD   450 mg at 07/02/18 0805  . LORazepam (ATIVAN) tablet 0.5 mg  0.5 mg Oral Q6H PRN Trevyon Swor, Myer Peer, MD   0.5 mg at 07/02/18 1018  . magnesium hydroxide (MILK OF MAGNESIA) suspension 30 mL  30 mL Oral Daily PRN Laverle Hobby, PA-C      . metFORMIN (GLUCOPHAGE-XR) 24 hr tablet 500 mg  500 mg Oral Q breakfast Sharma Covert, MD   500 mg at 07/02/18 0805  . propranolol (INDERAL) tablet 20 mg  20 mg Oral BID Sharma Covert, MD   20 mg at 07/02/18 1708  . QUEtiapine (SEROQUEL) tablet 150 mg  150 mg Oral QHS Emmaleah Meroney A, MD      . QUEtiapine (SEROQUEL) tablet 25 mg  25 mg Oral BID Lashan Macias, Myer Peer, MD   25 mg at 07/02/18 1708    Lab Results:  Results for orders placed or performed during the hospital encounter of 06/28/18 (from the past 48 hour(s))  Glucose, capillary     Status: None   Collection Time: 07/01/18  6:05 AM  Result Value Ref Range   Glucose-Capillary 90 70 - 99 mg/dL  T4, free     Status: Abnormal    Collection Time: 07/02/18  6:44 AM  Result Value Ref Range   Free T4 0.75 (L) 0.82 - 1.77 ng/dL    Comment: (NOTE) Biotin ingestion may interfere with free T4 tests. If the results are inconsistent with the TSH level, previous test results, or the clinical presentation, then consider biotin interference. If needed, order repeat testing after stopping biotin. Performed at [facility], [place] [address], [place], [place] [address]   Glucose, capillary     Status: None   Collection Time: 07/02/18 12:10 PM  Result Value Ref Range  Glucose-Capillary 82 70 - 99 mg/dL    Blood Alcohol level:  Lab Results  Component Value Date   ETH <10 01/18/2018   ETH <5 02/72/5366    Metabolic Disorder Labs: Lab Results  Component Value Date   HGBA1C 5.4 06/30/2018   MPG 108.28 06/30/2018   MPG 108.28 01/06/2018   Lab Results  Component Value Date   PROLACTIN 23.5 (H) 06/30/2018   Lab Results  Component Value Date   CHOL 230 (H) 06/30/2018   TRIG 366 (H) 06/30/2018   HDL 33 (L) 06/30/2018   CHOLHDL 7.0 06/30/2018   VLDL 73 (H) 06/30/2018   LDLCALC 124 (H) 06/30/2018   LDLCALC 158 (H) 01/06/2018    Physical Findings: AIMS: Facial and Oral Movements Muscles of Facial Expression: None, normal Lips and Perioral Area: None, normal Jaw: None, normal Tongue: None, normal,Extremity Movements Upper (arms, wrists, hands, fingers): None, normal Lower (legs, knees, ankles, toes): None, normal, Trunk Movements Neck, shoulders, hips: None, normal, Overall Severity Severity of abnormal movements (highest score from questions above): None, normal Incapacitation due to abnormal movements: None, normal Patient's awareness of abnormal movements (rate only patient's report): No Awareness, Dental Status Current problems with teeth and/or dentures?: No Does patient usually wear dentures?: No  CIWA:    COWS:     Musculoskeletal: Strength & Muscle Tone: within normal limits Gait & Station:  normal Patient leans: N/A  Psychiatric Specialty Exam: Physical Exam  Nursing note and vitals reviewed. Constitutional: She is oriented to person, place, and time. She appears well-developed and well-nourished.  HENT:  Head: Normocephalic and atraumatic.  Respiratory: Effort normal.  Neurological: She is alert and oriented to person, place, and time.    ROS no headache, no chest pain, no shortness of breath, (+) nausea, no vomiting, no diarrhea, no rash   Blood pressure (!) 158/107, pulse (!) 104, temperature 98 F (36.7 C), temperature source Oral, resp. rate 16, height '5\' 6"'$  (1.676 m), weight (!) 137 kg (302 lb).Body mass index is 48.74 kg/m.  General Appearance: Fairly Groomed  Eye Contact:  Good  Speech:  Normal Rate  Volume:  Normal  Mood:  Reports some improvement but still feeling depressed  Affect:  Remains constricted and slightly irritable  Thought Process:  Linear and Descriptions of Associations: Intact  Orientation:  Other:  fully alert and attentive  Thought Content:  no hallucinations, no delusions , not internally preoccupied   Suicidal Thoughts:  No-today denies denies suicidal plan or intention , contracts for safety on unit  Homicidal Thoughts:  No  Memory:  recent and remote grossly intact   Judgement:  Fair  Insight:  Fair  Psychomotor Activity:  Normal  Concentration:  Concentration: Good and Attention Span: Good  Recall:  Good  Fund of Knowledge:  Good  Language:  Good  Akathisia:  Negative  Handed:  Right  AIMS (if indicated):     Assets:  Desire for Improvement Financial Resources/Insurance Housing Resilience Social Support  ADL's:  Intact  Cognition:  WNL  Sleep:  Number of Hours: 6.75   Assessment -patient remains depressed, anxious slightly dysphoric/irritable.  She is tolerating medications well.  Currently on Prozac/Seroquel/Lithium.  Today denies suicidal ideations and is able to contract for safety on unit but continues to report  significant depression. TSH mildly elevated and T4 decreased.  Lithium may be contributing but she had a similarly elevated TSH in January 2019. As discussed with hospitalist/patient have started her on Synthroid.  Blood pressure remains elevated,  at hospitalist's recommendation have added lisinopril    Treatment Plan Summary: Treatment plan reviewed as below today 7/10 Encourage group and milieu participation  Treatment Team working on disposition planning options  Continue Prozac 20 mgrs QDAY for depression, anxiety Continue Neurontin 600 mgrs TID for anxiety Continue Lithium 450 mgrs BID for mood disorder  Increase Seroquel to 25 mgrs BID and 150 mgrs QHS for mood disorder, anxiety Continue Propranolol 20 mgrs BID for anxiety, BP  Start Lisinopril 5 mgQDAY for HTN Start Synthroid 50 micrograms for Hypothyroidism Check Li level in AM  Jenne Campus, MD 07/02/2018, 6:17 PM   Patient ID: Veronica Waters, female   DOB: [birth year], 27 y.o.   MRN: [id number]

## 2018-07-02 NOTE — [facility] Group Notes (Signed)
[facility] 07/02/2018 1:15pm  Type of Therapy: Mental Health Association Presentation  Participation Level: Invited. Chose to remain in bed.   Rona RavensHeather S Julena Barbour, LCSW 07/02/2018 2:46 PM

## 2018-07-02 NOTE — Progress Notes (Signed)
D: Patient observed up and visible on unit.  Patient came to med window asking for ativan prn and before this writer could explain, patient stormed off. Attention seeking and childlike behavior. Bragging to this Clinical research associatewriter that she had access to cranberry juice today to which she is highly allergic. Affect incongruent (smiling) as she spoke about the seriousness of her allergy. "It was the staff's fault. They should know. I want this allergy bracelet off." Denies pain, physical complaints.   A: Medicated per orders, prn ativan given after consultation with Dr. Jama Flavorsobos.  Medication education provided. Limits placed and behavior redirected. Reminded patient that as an adult she needs to make decisions for her health as well. Reminded patient that allergy bracelet would need to stay in place. Level III obs in place for safety. Emotional support offered. Patient encouraged to complete Suicide Safety Plan before discharge. Encouraged to attend and participate in unit programming.   R: Patient verbalizes understanding of POC, information provided. On reassess, patient was calmer however remained childlike. Patient denies HI/AVH however playfully endorses passive SI. Verbally contracts for safety and remains safe on level III obs. Will continue to monitor throughout the night.

## 2018-07-02 NOTE — Plan of Care (Signed)
Patient is med compliant however is not receptive to coping skills to improve thinking and feelings.

## 2018-07-02 NOTE — Plan of Care (Signed)
  Problem: Activity: Goal: Interest or engagement in leisure activities will improve Outcome: Not Progressing  Pt withdrawn and isolative to her room. Writer encouraged pt to attend scheduled groups. Pt attended one group this afternoon and left in the middle group.

## 2018-07-02 NOTE — Plan of Care (Signed)
[facility]   Case discussed with Dr. Jama Flavorsobos, question regarding elevated blood pressure despite been on propanolol and elevated TSH with low T4.   Patient is a 27 y/o F with morbid obesity and severe depression. [facility] with suicidal ideas. [facility] however asymptomatic. On lab w/u was noted TSH at 5.79 and Free T4 at 0.75, patient with previous lab work up of hypothyroidism in 12/2017, but not on treatment. Patient has been started on lithium for mood stabilization.   Recommendations   Hypertension  Start Lisinopril 5 mg daily, will continue propanolol as this will help with anxiety as well. HR slight elevated. Monitor BP in 1 week and titrate medications if needed. Encourage weight loss and life style modifications.   Hypothyroidism  Elevated TSH and Low free T4  Start Synthroid 50 mcg daily before breakfast. Check thyroid function levels in 6 weeks. Recommend a thyroid US as outpatient. Follow up with PCP   Latrelle DodrillEdwin Silva, MD

## 2018-07-03 LAB — LITHIUM LEVEL: LITHIUM LVL: 0.49 mmol/L — AB (ref 0.60–1.20)

## 2018-07-03 LAB — T3, FREE: T3 FREE: 3.1 pg/mL (ref 2.0–4.4)

## 2018-07-03 LAB — GLUCOSE, CAPILLARY: Glucose-Capillary: 83 mg/dL (ref 70–99)

## 2018-07-03 MED ORDER — GABAPENTIN 400 MG PO CAPS
400.0000 mg | ORAL_CAPSULE | Freq: Three times a day (TID) | ORAL | Status: DC
  Administered 2018-07-03 – 2018-07-05 (×5): 400 mg via ORAL
  Filled 2018-07-03: qty 1
  Filled 2018-07-03 (×2): qty 21
  Filled 2018-07-03: qty 1
  Filled 2018-07-03: qty 21
  Filled 2018-07-03 (×6): qty 1

## 2018-07-03 MED ORDER — FLUOXETINE HCL 10 MG PO CAPS
10.0000 mg | ORAL_CAPSULE | Freq: Every day | ORAL | Status: DC
  Administered 2018-07-04 – 2018-07-05 (×2): 10 mg via ORAL
  Filled 2018-07-03: qty 7
  Filled 2018-07-03 (×3): qty 1

## 2018-07-03 MED ORDER — QUETIAPINE FUMARATE 200 MG PO TABS
200.0000 mg | ORAL_TABLET | Freq: Every day | ORAL | Status: DC
  Administered 2018-07-03: 200 mg via ORAL
  Filled 2018-07-03 (×2): qty 1

## 2018-07-03 MED ORDER — LITHIUM CARBONATE ER 300 MG PO TBCR
600.0000 mg | EXTENDED_RELEASE_TABLET | Freq: Two times a day (BID) | ORAL | Status: DC
  Administered 2018-07-03 – 2018-07-05 (×4): 600 mg via ORAL
  Filled 2018-07-03: qty 28
  Filled 2018-07-03 (×4): qty 2
  Filled 2018-07-03: qty 28
  Filled 2018-07-03 (×2): qty 2

## 2018-07-03 MED ORDER — LISINOPRIL 10 MG PO TABS
10.0000 mg | ORAL_TABLET | Freq: Every day | ORAL | Status: DC
  Administered 2018-07-04 – 2018-07-05 (×2): 10 mg via ORAL
  Filled 2018-07-03: qty 1
  Filled 2018-07-03: qty 7
  Filled 2018-07-03 (×2): qty 1

## 2018-07-03 NOTE — Progress Notes (Signed)
Pt presents with an animated affect and anxious mood. Pt reports increased depression 10/10 and anxiety 10/10 on her self inventory sheet. Pt endorses passive SI with no plan or intent. Pt mood is labile. Pt attention seeking and child like throughout the day. Pt noted to be easily agitated and irritable throughout the day. Pt continues to voice negative thoughts about herself.  Orders reviewed with pt. Ativan administered to pt at her request for increased anxiety level. Verbal support provided. Pt highly encouraged to attend groups and identify coping skills for depression. Environmental checks performed every shift for safety. 15 minute checks performed for safety.  Pt compliant with tx plan.

## 2018-07-03 NOTE — Progress Notes (Signed)
[facility] MD Progress Note  07/03/2018 1:51 PM Veronica Waters  MRN:  [id number] Subjective: Patient reports she continues to feel depressed and describes feeling "drained" and endorses low energy and persistent sadness.  She endorses passive thoughts of death, but denies any plan or intention of hurting herself and contracts for safety.  She is tolerating medications well and currently denies medication side effects. Objective : I have reviewed chart notes and have met with patient . Patient is a 27 year old female with a past psychiatric history significant for bipolar disorder, posttraumatic stress disorder, borderline personality disorder . She had stopped  taking her medicines several weeks ago.  As above, reports persistent symptoms of depression and states that today she continues to feel sad with low energy and some anhedonia.  Endorses intermittent passive SI, but no plan or intention of hurting herself or of suicide and is able to contract for safety. She is tolerating medications well and does express she feels that they are helping albeit partially.  Currently does not endorse medication side effects. Behavior on unit in good control, no disruptive behaviors.  Of note, patient's affect appears reactive and significantly improved during interactions with selected peers and is noted to be smiling/laughing at times in dayroom. Labs reviewed-lithium serum level slightly subtherapeutic at 0.49.  Principal Problem:  Bipolar Disorder, PTSD by history  Diagnosis:   Patient Active Problem List   Diagnosis Date Noted  . Posttraumatic stress disorder [F43.10]   . Borderline personality disorder ([place]) [F60.3]   . Diabetes mellitus type 2 in obese (HCC) [E11.69, E66.9]   . Bipolar 1 disorder, mixed, severe ([place]) [F31.63] 06/28/2018  . Tachycardia [R00.0] 01/18/2018  . Severe recurrent major depression without psychotic features ([place]) [F33.2] 01/05/2018  . MDD (major depressive disorder), recurrent severe,  without psychosis ([place]) [F33.2] 09/01/2017   Total Time spent with patient: 20 minutes  Past Psychiatric History: See admission H&P  Past Medical History:  Past Medical History:  Diagnosis Date  . Asthma   . Hypertension    History reviewed. No pertinent surgical history. Family History:  Family History  Family history unknown: Yes   Family Psychiatric  History: See admission H&P Social History:  Social History   Substance and Sexual Activity  Alcohol Use No     Social History   Substance and Sexual Activity  Drug Use No    Social History   Socioeconomic History  . Marital status: Single    Spouse name: Not on file  . Number of children: Not on file  . Years of education: Not on file  . Highest education level: Not on file  Occupational History  . Not on file  Social Needs  . Financial resource strain: Not on file  . Food insecurity:    Worry: Not on file    Inability: Not on file  . Transportation needs:    Medical: Not on file    Non-medical: Not on file  Tobacco Use  . Smoking status: Never Smoker  . Smokeless tobacco: Never Used  Substance and Sexual Activity  . Alcohol use: No  . Drug use: No  . Sexual activity: Never    Birth control/protection: Other-see comments    Comment: Rod in left arm for birth control  Lifestyle  . Physical activity:    Days per week: Not on file    Minutes per session: Not on file  . Stress: Not on file  Relationships  . Social connections:    Talks on phone:  Not on file    Gets together: Not on file    Attends religious service: Not on file    Active member of club or organization: Not on file    Attends meetings of clubs or organizations: Not on file    Relationship status: Not on file  Other Topics Concern  . Not on file  Social History Narrative  . Not on file   Additional Social History:    Pain Medications: See MAR Prescriptions: See MAR Over the Counter: See MAR History of alcohol / drug use?: Yes(Pt  reports a history of using a variety of drugs in the past.) Longest period of sobriety (when/how long): 4 years currently Negative Consequences of Use: Financial, Legal  Sleep: Good  Appetite:  Good  Current Medications: Current Facility-Administered Medications  Medication Dose Route Frequency Provider Last Rate Last Dose  . albuterol (PROVENTIL HFA;VENTOLIN HFA) 108 (90 Base) MCG/ACT inhaler 2 puff  2 puff Inhalation Q6H PRN Sharma Covert, MD      . alum & mag hydroxide-simeth (MAALOX/MYLANTA) 200-200-20 MG/5ML suspension 30 mL  30 mL Oral Q4H PRN Patriciaann Clan E, PA-C   30 mL at 06/29/18 2056  . FLUoxetine (PROZAC) capsule 20 mg  20 mg Oral Daily Sharma Covert, MD   20 mg at 07/03/18 0830  . gabapentin (NEURONTIN) tablet 600 mg  600 mg Oral TID Sharma Covert, MD   600 mg at 07/03/18 1158  . levothyroxine (SYNTHROID, LEVOTHROID) tablet 50 mcg  50 mcg Oral QAC breakfast Aashish Hamm, Myer Peer, MD   50 mcg at 07/03/18 1157  . [START ON 07/04/2018] lisinopril (PRINIVIL,ZESTRIL) tablet 10 mg  10 mg Oral Daily Angeliah Wisdom A, MD      . lithium carbonate (ESKALITH) CR tablet 450 mg  450 mg Oral Q12H Sharma Covert, MD   450 mg at 07/03/18 0830  . LORazepam (ATIVAN) tablet 0.5 mg  0.5 mg Oral Q6H PRN Roddie Riegler, Myer Peer, MD   0.5 mg at 07/03/18 0830  . magnesium hydroxide (MILK OF MAGNESIA) suspension 30 mL  30 mL Oral Daily PRN Laverle Hobby, PA-C      . metFORMIN (GLUCOPHAGE-XR) 24 hr tablet 500 mg  500 mg Oral Q breakfast Sharma Covert, MD   500 mg at 07/03/18 0830  . propranolol (INDERAL) tablet 20 mg  20 mg Oral BID Sharma Covert, MD   20 mg at 07/03/18 0830  . QUEtiapine (SEROQUEL) tablet 150 mg  150 mg Oral QHS Seini Lannom, Myer Peer, MD   150 mg at 07/02/18 2159  . QUEtiapine (SEROQUEL) tablet 25 mg  25 mg Oral BID Earlyn Sylvan, Myer Peer, MD   25 mg at 07/03/18 0830    Lab Results:  Results for orders placed or performed during the hospital encounter of 06/28/18 (from  the past 48 hour(s))  T4, free     Status: Abnormal   Collection Time: 07/02/18  6:44 AM  Result Value Ref Range   Free T4 0.75 (L) 0.82 - 1.77 ng/dL    Comment: (NOTE) Biotin ingestion may interfere with free T4 tests. If the results are inconsistent with the TSH level, previous test results, or the clinical presentation, then consider biotin interference. If needed, order repeat testing after stopping biotin. Performed at [facility], [place] [address], [place], [place] [address]   T3, free     Status: None   Collection Time: 07/02/18  6:44 AM  Result Value Ref Range  T3, Free 3.1 2.0 - 4.4 pg/mL    Comment: (NOTE) Performed At: [facility] [place], [place] [id number] Rush Farmer MD QA:[id number]   Glucose, capillary     Status: None   Collection Time: 07/02/18 12:10 PM  Result Value Ref Range   Glucose-Capillary 82 70 - 99 mg/dL  Glucose, capillary     Status: None   Collection Time: 07/03/18  6:32 AM  Result Value Ref Range   Glucose-Capillary 83 70 - 99 mg/dL  Lithium level     Status: Abnormal   Collection Time: 07/03/18  6:43 AM  Result Value Ref Range   Lithium Lvl 0.49 (L) 0.60 - 1.20 mmol/L    Comment: Performed at [facility], [place] [address], [place], [place] [address]    Blood Alcohol level:  Lab Results  Component Value Date   [facility] <10 01/18/2018   ETH <5 11/94/1740    Metabolic Disorder Labs: Lab Results  Component Value Date   HGBA1C 5.4 06/30/2018   MPG 108.28 06/30/2018   MPG 108.28 01/06/2018   Lab Results  Component Value Date   PROLACTIN 23.5 (H) 06/30/2018   Lab Results  Component Value Date   CHOL 230 (H) 06/30/2018   TRIG 366 (H) 06/30/2018   HDL 33 (L) 06/30/2018   CHOLHDL 7.0 06/30/2018   VLDL 73 (H) 06/30/2018   LDLCALC 124 (H) 06/30/2018   LDLCALC 158 (H) 01/06/2018    Physical Findings: AIMS: Facial and Oral Movements Muscles of Facial Expression: None, normal Lips and  Perioral Area: None, normal Jaw: None, normal Tongue: None, normal,Extremity Movements Upper (arms, wrists, hands, fingers): None, normal Lower (legs, knees, ankles, toes): None, normal, Trunk Movements Neck, shoulders, hips: None, normal, Overall Severity Severity of abnormal movements (highest score from questions above): None, normal Incapacitation due to abnormal movements: None, normal Patient's awareness of abnormal movements (rate only patient's report): No Awareness, Dental Status Current problems with teeth and/or dentures?: No Does patient usually wear dentures?: No  CIWA:    COWS:     Musculoskeletal: Strength & Muscle Tone: within normal limits Gait & Station: normal Patient leans: N/A  Psychiatric Specialty Exam: Physical Exam  Nursing note and vitals reviewed. Constitutional: She is oriented to person, place, and time. She appears well-developed and well-nourished.  HENT:  Head: Normocephalic and atraumatic.  Respiratory: Effort normal.  Neurological: She is alert and oriented to person, place, and time.    ROS no headache, no chest pain, no shortness of breath, (+) nausea, no vomiting, no diarrhea, no rash   Blood pressure (!) 135/96, pulse 82, temperature 98 F (36.7 C), temperature source Oral, resp. rate 16, height '5\' 6"'$  (1.676 m), weight (!) 137 kg (302 lb).Body mass index is 48.74 kg/m.  General Appearance: Fairly Groomed  Eye Contact:  Good  Speech:  Normal Rate  Volume:  Normal  Mood:  Reports persistent depression  Affect:  At this time remains constricted but noted to be reactive and full in range  Thought Process:  Linear and Descriptions of Associations: Intact  Orientation:  Other:  fully alert and attentive  Thought Content:  no hallucinations, no delusions , not internally preoccupied   Suicidal Thoughts:  Yes.  without intent/plan-describes intermittent passive SI but denies suicidal plan or intention , contracts for safety on unit  Homicidal  Thoughts:  No  Memory:  recent and remote grossly intact   Judgement:  Other:  Improving  Insight:  Fair  Psychomotor  Activity:  Normal  Concentration:  Concentration: Good and Attention Span: Good  Recall:  Good  Fund of Knowledge:  Good  Language:  Good  Akathisia:  Negative  Handed:  Right  AIMS (if indicated):     Assets:  Desire for Improvement Financial Resources/Insurance Housing Resilience Social Support  ADL's:  Intact  Cognition:  WNL  Sleep:  Number of Hours: 6.75   Assessment -patient reports he continues to feel depressed, anxious, and endorses intermittent passive SI although no actual plan or intention of hurting herself.  Affect is variable but noted to be reactive and brighter during interaction with peers.  She is tolerating medications well and she feels that medications are helping.  Lithium level slightly subtherapeutic, denies side effects .  We discussed options and agrees to lithium dose titration. Blood pressure remains elevated, has tolerated lisinopril well thus far. Feels vaguely sedated during the day so we will adjust Seroquel to nightly dosing only.   Treatment Plan Summary: Treatment plan reviewed as below today 7/11 Encourage group and milieu participation  Treatment Team working on disposition planning options  Decrease Prozac to 10 mgrs QDAY for depression, anxiety Decrease  Neurontin to 400  mgrs TID for anxiety- to minimize potential side effects and sedating effects  Increase Lithium to 600 mgrs BID for mood disorder  Change  Seroquel to  200  mgrs QHS for mood disorder, anxiety Continue Propranolol 20 mgrs BID for anxiety, BP  Increase Lisinopril to 10  mgQDAY for HTN Continue  Synthroid 50 micrograms for Hypothyroidism   Jenne Campus, MD 07/03/2018, 1:51 PM   Patient ID: Veronica Waters, female   DOB: September 27, 1991, 27 y.o.   MRN: [id number]

## 2018-07-03 NOTE — [facility] Group Notes (Signed)
[facility] LCSW Group Therapy Note  Date/Time: 07/03/18, 1315  Type of Therapy/Topic:  Group Therapy:  Balance in Life  Participation Level:  moderate  Description of Group:    This group will address the concept of balance and how it feels and looks when one is unbalanced. Patients will be encouraged to process areas in their lives that are out of balance, and identify reasons for remaining unbalanced. Facilitators will guide patients utilizing problem- solving interventions to address and correct the stressor making their life unbalanced. Understanding and applying boundaries will be explored and addressed for obtaining  and maintaining a balanced life. Patients will be encouraged to explore ways to assertively make their unbalanced needs known to significant others in their lives, using other group members and facilitator for support and feedback.  Therapeutic Goals: 1. Patient will identify two or more emotions or situations they have that consume much of in their lives. 2. Patient will identify signs/triggers that life has become out of balance:  3. Patient will identify two ways to set boundaries in order to achieve balance in their lives:  4. Patient will demonstrate ability to communicate their needs through discussion and/or role plays  Summary of Patient Progress:Pt shared that family and work are the areas that are currently out of balance in her life.  Pt active in group discussion about ways to recognized and respond to areas of life that get out of balance.           Therapeutic Modalities:   Cognitive Behavioral Therapy Solution-Focused Therapy Assertiveness Training  Daleen SquibbGreg Zaela Graley, [place]CS[place]

## 2018-07-03 NOTE — [facility] Group Notes (Signed)
[facility] Group Notes:  (Nursing/MHT/Case Management/Adjunct)  Date:  07/03/2018  Time:  5:03 PM  Type of Therapy:  Psychoeducational Skills  Participation Level:  Active  Participation Quality:  Appropriate and Attentive  Affect:  Appropriate  Cognitive:  Alert and Appropriate  Insight:  Appropriate and Good  Engagement in Group:  Engaged  Modes of Intervention:  Activity  Summary of Progress/Problems: Patient actively participated and was receptive.  [name] 07/03/2018, 5:03 PM

## 2018-07-03 NOTE — Plan of Care (Signed)
  Problem: Medication: Goal: Compliance with prescribed medication regimen will improve Outcome: Progressing   Problem: Safety: Goal: Ability to disclose and discuss suicidal ideas will improve Outcome: Progressing

## 2018-07-04 LAB — GLUCOSE, CAPILLARY
GLUCOSE-CAPILLARY: 65 mg/dL — AB (ref 70–99)
Glucose-Capillary: 128 mg/dL — ABNORMAL HIGH (ref 70–99)

## 2018-07-04 MED ORDER — FLUTICASONE PROPIONATE 50 MCG/ACT NA SUSP
2.0000 | Freq: Every day | NASAL | Status: DC
  Administered 2018-07-04: 2 via NASAL
  Filled 2018-07-04 (×2): qty 16

## 2018-07-04 MED ORDER — QUETIAPINE FUMARATE 50 MG PO TABS
50.0000 mg | ORAL_TABLET | Freq: Two times a day (BID) | ORAL | Status: DC
  Administered 2018-07-04 – 2018-07-05 (×2): 50 mg via ORAL
  Filled 2018-07-04 (×2): qty 1
  Filled 2018-07-04: qty 14
  Filled 2018-07-04: qty 1
  Filled 2018-07-04: qty 14
  Filled 2018-07-04 (×2): qty 1

## 2018-07-04 MED ORDER — QUETIAPINE FUMARATE 50 MG PO TABS: 50.0000 mg | ORAL_TABLET | Freq: Two times a day (BID) | ORAL | Status: DC

## 2018-07-04 MED ORDER — QUETIAPINE FUMARATE 200 MG PO TABS
200.0000 mg | ORAL_TABLET | Freq: Every day | ORAL | Status: DC
  Administered 2018-07-04: 200 mg via ORAL
  Filled 2018-07-04: qty 1
  Filled 2018-07-04: qty 7
  Filled 2018-07-04: qty 1

## 2018-07-04 MED ORDER — DM-GUAIFENESIN ER 30-600 MG PO TB12
1.0000 | ORAL_TABLET | Freq: Two times a day (BID) | ORAL | Status: DC
  Administered 2018-07-04 – 2018-07-05 (×2): 1 via ORAL
  Filled 2018-07-04 (×4): qty 1

## 2018-07-04 NOTE — Progress Notes (Signed)
Nutrition Education Note  Pt attended group focusing on general, healthful nutrition education.  RD emphasized the importance of eating regular meals and snacks throughout the day. Consuming sugar-free beverages and incorporating fruits and vegetables into diet when possible. Provided examples of healthy snacks. Patient encouraged to leave group with a goal to improve nutrition/healthy eating.   Diet Order:  Diet Order           Diet regular Room service appropriate? Yes; Fluid consistency: Thin  Diet effective now         Pt is also offered choice of unit snacks mid-morning and mid-afternoon.  Pt is eating as desired.   If additional nutrition issues arise, please consult RD.    Maxcine Strong, MS, RD, LDN, CNSC Inpatient Clinical Dietitian Pager # [phone] After hours/weekend pager # [phone]    

## 2018-07-04 NOTE — Progress Notes (Signed)
Patient states that she is currently experiencing cold symptoms and has a stuffy nose as well. She also stated that she had a good visit with her "biological mom" despite the fact that they normally don't get along. Her goal for tomorrow is to attend all of her outpatient appointments and try to stay on her medication.

## 2018-07-04 NOTE — Progress Notes (Signed)
[facility] MD Progress Note  07/04/2018 5:48 PM Laniah Grimm  MRN:  [id number] Subjective: Patient reports she feels better and is more focused on discharge, stating she does not want to miss work any longer.  Denies medication side effects.  Endorses passive suicidal thoughts but without plan or intention. Objective : I have reviewed chart notes and have met with patient . Patient is a 27 year old female with a past psychiatric history significant for bipolar disorder, posttraumatic stress disorder, borderline personality disorder . She had stopped  taking her medicines several weeks ago.  She has presented with intermittent irritability, at times loud, angry, but responsive to staff reassurance and support.  She states that she has chronic suicidal ideations although without plan or intention, acknowledges that these  became more frequent after the death of her grandmother, but states that she is not wanting to die, wanting to return to work, and would like to develop better coping skills to deal with stress. Focusing on discharge, irritable that other patients have been discharged today but not her.  Denies medication side effects.   Principal Problem:  Bipolar Disorder, PTSD by history  Diagnosis:   Patient Active Problem List   Diagnosis Date Noted  . Posttraumatic stress disorder [F43.10]   . Borderline personality disorder ([place]) [F60.3]   . Diabetes mellitus type 2 in obese (HCC) [E11.69, E66.9]   . Bipolar 1 disorder, mixed, severe ([place]) [F31.63] 06/28/2018  . Tachycardia [R00.0] 01/18/2018  . Severe recurrent major depression without psychotic features ([place]) [F33.2] 01/05/2018  . MDD (major depressive disorder), recurrent severe, without psychosis ([place]) [F33.2] 09/01/2017   Total Time spent with patient: 20 minutes  Past Psychiatric History: See admission H&P  Past Medical History:  Past Medical History:  Diagnosis Date  . Asthma   . Hypertension    History reviewed. No pertinent  surgical history. Family History:  Family History  Family history unknown: Yes   Family Psychiatric  History: See admission H&P Social History:  Social History   Substance and Sexual Activity  Alcohol Use No     Social History   Substance and Sexual Activity  Drug Use No    Social History   Socioeconomic History  . Marital status: Single    Spouse name: Not on file  . Number of children: Not on file  . Years of education: Not on file  . Highest education level: Not on file  Occupational History  . Not on file  Social Needs  . Financial resource strain: Not on file  . Food insecurity:    Worry: Not on file    Inability: Not on file  . Transportation needs:    Medical: Not on file    Non-medical: Not on file  Tobacco Use  . Smoking status: Never Smoker  . Smokeless tobacco: Never Used  Substance and Sexual Activity  . Alcohol use: No  . Drug use: No  . Sexual activity: Never    Birth control/protection: Other-see comments    Comment: Rod in left arm for birth control  Lifestyle  . Physical activity:    Days per week: Not on file    Minutes per session: Not on file  . Stress: Not on file  Relationships  . Social connections:    Talks on phone: Not on file    Gets together: Not on file    Attends religious service: Not on file    Active member of club or organization: Not on file  Attends meetings of clubs or organizations: Not on file    Relationship status: Not on file  Other Topics Concern  . Not on file  Social History Narrative  . Not on file   Additional Social History:    Pain Medications: See MAR Prescriptions: See MAR Over the Counter: See MAR History of alcohol / drug use?: Yes(Pt reports a history of using a variety of drugs in the past.) Longest period of sobriety (when/how long): 4 years currently Negative Consequences of Use: Financial, Legal  Sleep: Good  Appetite:  Good  Current Medications: Current Facility-Administered  Medications  Medication Dose Route Frequency Provider Last Rate Last Dose  . albuterol (PROVENTIL HFA;VENTOLIN HFA) 108 (90 Base) MCG/ACT inhaler 2 puff  2 puff Inhalation Q6H PRN Sharma Covert, MD      . alum & mag hydroxide-simeth (MAALOX/MYLANTA) 200-200-20 MG/5ML suspension 30 mL  30 mL Oral Q4H PRN Patriciaann Clan E, PA-C   30 mL at 06/29/18 2056  . dextromethorphan-guaiFENesin (MUCINEX DM) 30-600 MG per 12 hr tablet 1 tablet  1 tablet Oral BID Makena Murdock, Myer Peer, MD   1 tablet at 07/04/18 1558  . FLUoxetine (PROZAC) capsule 10 mg  10 mg Oral Daily Raevon Broom, Myer Peer, MD   10 mg at 07/04/18 0819  . gabapentin (NEURONTIN) capsule 400 mg  400 mg Oral TID Samaria Anes, Myer Peer, MD   400 mg at 07/04/18 1559  . levothyroxine (SYNTHROID, LEVOTHROID) tablet 50 mcg  50 mcg Oral QAC breakfast Elpidia Karn, Myer Peer, MD   50 mcg at 07/04/18 0636  . lisinopril (PRINIVIL,ZESTRIL) tablet 10 mg  10 mg Oral Daily Quierra Silverio, Myer Peer, MD   10 mg at 07/04/18 0819  . lithium carbonate (LITHOBID) CR tablet 600 mg  600 mg Oral Q12H Natsha Guidry, Myer Peer, MD   600 mg at 07/04/18 0819  . LORazepam (ATIVAN) tablet 0.5 mg  0.5 mg Oral Q6H PRN Kaeley Vinje, Myer Peer, MD   0.5 mg at 07/04/18 1736  . magnesium hydroxide (MILK OF MAGNESIA) suspension 30 mL  30 mL Oral Daily PRN Laverle Hobby, PA-C      . metFORMIN (GLUCOPHAGE-XR) 24 hr tablet 500 mg  500 mg Oral Q breakfast Sharma Covert, MD   500 mg at 07/04/18 0819  . propranolol (INDERAL) tablet 20 mg  20 mg Oral BID Sharma Covert, MD   20 mg at 07/04/18 1558  . QUEtiapine (SEROQUEL) tablet 50 mg  50 mg Oral BID Lola Czerwonka, Myer Peer, MD   50 mg at 07/04/18 1307    Lab Results:  Results for orders placed or performed during the hospital encounter of 06/28/18 (from the past 48 hour(s))  Glucose, capillary     Status: None   Collection Time: 07/03/18  6:32 AM  Result Value Ref Range   Glucose-Capillary 83 70 - 99 mg/dL  Lithium level     Status: Abnormal   Collection Time:  07/03/18  6:43 AM  Result Value Ref Range   Lithium Lvl 0.49 (L) 0.60 - 1.20 mmol/L    Comment: Performed at [facility], [place] [address], [place],  [address]  Glucose, capillary     Status: Abnormal   Collection Time: 07/04/18  6:29 AM  Result Value Ref Range   Glucose-Capillary 65 (L) 70 - 99 mg/dL  Glucose, capillary     Status: Abnormal   Collection Time: 07/04/18  7:25 AM  Result Value Ref Range   Glucose-Capillary 128 (H) 70 -  99 mg/dL    Blood Alcohol level:  Lab Results  Component Value Date   ETH <10 01/18/2018   ETH <5 40/98/1191    Metabolic Disorder Labs: Lab Results  Component Value Date   HGBA1C 5.4 06/30/2018   MPG 108.28 06/30/2018   MPG 108.28 01/06/2018   Lab Results  Component Value Date   PROLACTIN 23.5 (H) 06/30/2018   Lab Results  Component Value Date   CHOL 230 (H) 06/30/2018   TRIG 366 (H) 06/30/2018   HDL 33 (L) 06/30/2018   CHOLHDL 7.0 06/30/2018   VLDL 73 (H) 06/30/2018   LDLCALC 124 (H) 06/30/2018   LDLCALC 158 (H) 01/06/2018    Physical Findings: AIMS: Facial and Oral Movements Muscles of Facial Expression: None, normal Lips and Perioral Area: None, normal Jaw: None, normal Tongue: None, normal,Extremity Movements Upper (arms, wrists, hands, fingers): None, normal Lower (legs, knees, ankles, toes): None, normal, Trunk Movements Neck, shoulders, hips: None, normal, Overall Severity Severity of abnormal movements (highest score from questions above): None, normal Incapacitation due to abnormal movements: None, normal Patient's awareness of abnormal movements (rate only patient's report): No Awareness, Dental Status Current problems with teeth and/or dentures?: No Does patient usually wear dentures?: No  CIWA:    COWS:     Musculoskeletal: Strength & Muscle Tone: within normal limits Gait & Station: normal Patient leans: N/A  Psychiatric Specialty Exam: Physical Exam  Nursing note and vitals  reviewed. Constitutional: She is oriented to person, place, and time. She appears well-developed and well-nourished.  HENT:  Head: Normocephalic and atraumatic.  Respiratory: Effort normal.  Neurological: She is alert and oriented to person, place, and time.    ROS no headache, no chest pain, no shortness of breath, (+) nausea, no vomiting, no diarrhea, no rash   Blood pressure (!) 144/91, pulse 96, temperature 97.7 F (36.5 C), temperature source Oral, resp. rate 16, height '5\' 6"'$  (1.676 m), weight (!) 137 kg (302 lb).Body mass index is 48.74 kg/m.  General Appearance: Fairly Groomed  Eye Contact:  Good  Speech:  Normal Rate  Volume:  Normal  Mood:  Reports partially improved mood, presents irritable  Affect:  Labile, irritable, improves gradually with support  Thought Process:  Linear and Descriptions of Associations: Intact  Orientation:  Other:  fully alert and attentive  Thought Content:  no hallucinations, no delusions , not internally preoccupied   Suicidal Thoughts:  Yes.  without intent/plan-describes intermittent passive SI but denies suicidal plan or intention , contracts for safety on unit  Homicidal Thoughts:  No  Memory:  recent and remote grossly intact   Judgement:  Other:  Improving  Insight:  Fair  Psychomotor Activity:  Normal  Concentration:  Concentration: Good and Attention Span: Good  Recall:  Good  Fund of Knowledge:  Good  Language:  Good  Akathisia:  Negative  Handed:  Right  AIMS (if indicated):     Assets:  Desire for Improvement Financial Resources/Insurance Housing Resilience Social Support  ADL's:  Intact  Cognition:  WNL  Sleep:  Number of Hours: 6   Assessment -patient continues to report subjective irritability/anger, some passive suicidal ideations without current plan or intention of hurting herself.  Her affect responds gradually to staff support and gentle redirection- she does present more future oriented and states she is hoping to  discharge soon in order to return to work.  Denies medication side effects.  Patient states she felt that daytime Seroquel dosing was helping her feel calmer,  reports medications have been well-tolerated.  Of note states that she has lost weight over recent months, in spite of cerebral treatment, and does not associate this medication with weight gain  Treatment Plan Summary: Treatment plan reviewed as below today 7/12 Encourage group and milieu participation  Treatment Team working on disposition planning options  Continue  Prozac  10 mgrs QDAY for depression, anxiety Continue  Neurontin 400  mgrs TID for anxiety- to minimize potential side effects and sedating effects  Continue Lithium  600 mgrs BID for mood disorder  Change  Seroquel to 50 mgrs BID and  200  mgrs QHS for mood disorder, anxiety Continue Propranolol 20 mgrs BID for anxiety, BP  Continue Lisinopril  10  mgQDAY for HTN Continue  Synthroid 50 micrograms for Hypothyroidism   Jenne Campus, MD 07/04/2018, 5:48 PM   Patient ID: Terrill Mohr, female   DOB: 1991/06/12, 27 y.o.   MRN: [id number]

## 2018-07-04 NOTE — Progress Notes (Signed)
Recreation Therapy Notes  Date: 7.12.19 Time: 0930 Location: [address] Dayroom  Group Topic: Stress Management  Goal Area(s) Addresses:  Patient will verbalize importance of using healthy stress management.  Patient will identify positive emotions associated with healthy stress management.   Intervention: Stress Management  Activity :  Meditation.  LRT introduced the stress management technique of meditation.  LRT played Waters meditation on being resilient.  Patients were to listen to the meditation and follow along as meditation played.  Education:  Stress Management, Discharge Planning.   Education Outcome: Acknowledges edcuation/In group clarification offered/Needs additional education  Clinical Observations/Feedback: Pt did not attend group.    Brett Darko, LRT/CTRS         Veronica Waters 07/04/2018 11:40 AM 

## 2018-07-04 NOTE — Progress Notes (Signed)
DAR NOTE: Patient presents with anxious affect and irritable mood this evening. Pt stated her day was wacky, stated she has a lot going on in her life, she has been in good mood for a while. Pt heard talking on the phone using curse words. Complained of increased anxiety, ativan 0.5 mg PO given. Pt also complained of soar throat and stuffy nose. Denies pain, auditory and visual hallucinations.  Maintained on routine safety checks.  Medications given as prescribed.  Support and encouragement offered as needed. Pt's safety ensured with 15 minute checks.  Pt verbally agrees to seek staff if SI/HI or A/VH occurs and to consult with staff before acting on these thoughts. Will continue POC.

## 2018-07-04 NOTE — Progress Notes (Signed)
D: Patient irritable and cursing this morning.  Patient exhibits manipulative behavior by cursing so staff can overhear her.  She states, "the nurses are listening to my phone conversations and they shouldn't be."  Patient has tendency to loudly curse while on the phone.  She would not answer when asked if she felt ready for discharge.  She states, "I want out of this place.  It doesn't matter whether I'm suicidal or not."  Patient states this with a smile on her face.  She is taking her ativan 0.5 every 6 hours, however, does not appear anxious to staff.  A: Continue to monitor medication management and MD orders.  Safety checks completed every 15 minutes per protocol.  Offer support and encouragement as needed.  R: Patient is manipulative and attention seeking.

## 2018-07-04 NOTE — Plan of Care (Signed)
  Problem: Coping: Goal: Coping ability will improve Outcome: Progressing   Problem: Medication: Goal: Compliance with prescribed medication regimen will improve Outcome: Progressing   Problem: Self-Concept: Goal: Will verbalize positive feelings about self Outcome: Progressing Note:  Patient is talkative and pleasant this afternoon.  She is looking forward to discharge.

## 2018-07-04 NOTE — [facility] Group Notes (Signed)
[facility] LCSW Group Therapy Note  Date/Time: 07/04/18, 1315  Type of Therapy/Topic:  Group Therapy:  Feelings about Diagnosis  Participation Level:  Active   Mood:pleasant   Description of Group:    This group will allow patients to explore their thoughts and feelings about diagnoses they have received. Patients will be guided to explore their level of understanding and acceptance of these diagnoses. Facilitator will encourage patients to process their thoughts and feelings about the reactions of others to their diagnosis, and will guide patients in identifying ways to discuss their diagnosis with significant others in their lives. This group will be process-oriented, with patients participating in exploration of their own experiences as well as giving and receiving support and challenge from other group members.   Therapeutic Goals: 1. Patient will demonstrate understanding of diagnosis as evidence by identifying two or more symptoms of the disorder:  2. Patient will be able to express two feelings regarding the diagnosis 3. Patient will demonstrate ability to communicate their needs through discussion and/or role plays  Summary of Patient Progress: Pt active participant in group discussion regarding the above topic.  Pt shared examples and was engaged and interacted with other patients.          Therapeutic Modalities:   Cognitive Behavioral Therapy Brief Therapy Feelings Identification   Daleen SquibbGreg Whitney Bingaman, LCSW

## 2018-07-05 LAB — GLUCOSE, CAPILLARY: Glucose-Capillary: 89 mg/dL (ref 70–99)

## 2018-07-05 MED ORDER — QUETIAPINE FUMARATE 50 MG PO TABS: 50.0000 mg | ORAL_TABLET | Freq: Two times a day (BID) | ORAL | 0 refills | Status: DC

## 2018-07-05 MED ORDER — PROPRANOLOL HCL 20 MG PO TABS: 20.0000 mg | ORAL_TABLET | Freq: Two times a day (BID) | ORAL | 0 refills | Status: DC

## 2018-07-05 MED ORDER — LISINOPRIL 10 MG PO TABS: 10.0000 mg | ORAL_TABLET | Freq: Every day | ORAL | 0 refills | Status: DC

## 2018-07-05 MED ORDER — QUETIAPINE FUMARATE 200 MG PO TABS: 200.0000 mg | ORAL_TABLET | Freq: Every day | ORAL | 0 refills | Status: DC

## 2018-07-05 MED ORDER — LITHIUM CARBONATE ER 300 MG PO TBCR: 600.0000 mg | EXTENDED_RELEASE_TABLET | Freq: Two times a day (BID) | ORAL | 0 refills | Status: DC

## 2018-07-05 MED ORDER — FLUOXETINE HCL 10 MG PO CAPS: 10.0000 mg | ORAL_CAPSULE | Freq: Every day | ORAL | 0 refills | Status: DC

## 2018-07-05 MED ORDER — GABAPENTIN 400 MG PO CAPS: 400.0000 mg | ORAL_CAPSULE | Freq: Three times a day (TID) | ORAL | 0 refills | Status: DC

## 2018-07-05 MED ORDER — LEVOTHYROXINE SODIUM 50 MCG PO TABS: 50.0000 ug | ORAL_TABLET | Freq: Every day | ORAL | 0 refills | Status: DC

## 2018-07-05 NOTE — [facility] Suicide Risk Assessment (Addendum)
[facility]   Principal Problem: Bipolar Disorder  Discharge Diagnoses:  Patient Active Problem List   Diagnosis Date Noted  . Posttraumatic stress disorder [F43.10]   . Borderline personality disorder (HCC) [F60.3]   . Diabetes mellitus type 2 in obese (HCC) [E11.69, E66.9]   . Bipolar 1 disorder, mixed, severe (HCC) [F31.63] 06/28/2018  . Tachycardia [R00.0] 01/18/2018  . Severe recurrent major depression without psychotic features (HCC) [F33.2] 01/05/2018  . MDD (major depressive disorder), recurrent severe, without psychosis (HCC) [F33.2] 09/01/2017    Total Time spent with patient: 30 minutes  Musculoskeletal: Strength & Muscle Tone: within normal limits Gait & Station: normal Patient leans: N/A  Psychiatric Specialty Exam: ROS rhinorrhea, denies chest pain , no shortness of breath, no vomiting, no fever  Blood pressure (!) 144/91, pulse 96, temperature 97.7 F (36.5 C), temperature source Oral, resp. rate 16, height 5\' 6"  (1.676 m), weight (!) 137 kg (302 lb).Body mass index is 48.74 kg/m.  General Appearance: improving grooming   Eye Contact::  Good  Speech:  Normal Rate409  Volume:  Normal  Mood:  reports she is feeling better, states mood is improved and describes it as 7/10  Affect:  Appropriate and not irritable at this time  Thought Process:  Linear and Descriptions of Associations: Intact  Orientation:  Full (Time, Place, and Person)  Thought Content:  no hallucinations, no delusions, not internally preoccupied   Suicidal Thoughts:  No- denies any suicidal ideations today- has reported chronic , intermittent passive thoughts of death, but denies any today and is future oriented, for example planning to return to work tomorrow   Homicidal Thoughts:  No  Memory:  recent and remote grossly intact   Judgement:  Other:  improving   Insight:  improving   Psychomotor Activity:  Normal  Concentration:  Good  Recall:  Good  Fund of  Knowledge:Good  Language: Good  Akathisia:  Negative  Handed:  Right  AIMS (if indicated):   no abnormal or involuntary movements noted or reported   Assets:  Communication Skills Resilience  Sleep:  Number of Hours: 6  Cognition: WNL  ADL's:  Intact   Mental Status Per Nursing Assessment::   On Admission:  Suicidal ideation indicated by patient, Self-harm thoughts  Demographic Factors:  27 year old single female, no children, currently living with her parents, employed   Loss Factors: Grandmother passed away earlier this year, had stopped taking medications  Historical Factors: She has been diagnosed with Bipolar Disorder, Borderline Personality Disorder. History of prior psychiatric admissions, history of suicide attempt, history of self cutting   Risk Reduction Factors:   Employed, Living with another person, especially a relative and Positive coping skills or problem solving skills  Continued Clinical Symptoms:  At this time patient is alert, attentive, calm, no psychomotor agitation , speech normal, mood is reported as improved, and does present with a fuller range of affect, not irritable at this time. No thought disorder, denies suicidal plan or intention and is future oriented, focused on returning to work soon. She reports history of chronic , intermittent passive SI but denies any thoughts today. Denies HI. No hallucinations, no delusions, not internally preoccupied . Denies medication side effects. Behavior on unit currently in good control, visible on unit, interacting appropriately with peers . She reports she has a safety plan- states that she has a good friend who she can call if needed, feels she has a lot of support from her church,  and states that taking time to herself , driving to clear her mind " helps a lot". We discussed medication side effects, including potential side effects and teratogenic risks associated with lithium, potential for metabolic and movement  disorders associated with Seroquel. (Of note, patient reports she has lost significant amount of weight over last year, even on Seroquel, and HgbA1C has decreased to 5.4 ) Patient advised not to drive if feeling sedated .  Cognitive Features That Contribute To Risk:  No gross cognitive deficits noted upon discharge. Is alert , attentive, and oriented x 3   Suicide Risk:  Mild:  Suicidal ideation of limited frequency, intensity, duration, and specificity.  There are no identifiable plans, no associated intent, mild dysphoria and related symptoms, good self-control (both objective and subjective assessment), few other risk factors, and identifiable protective factors, including available and accessible social support.  Follow-up Information    [facility] Follow up.   Specialty:  [facility] information: [address] [place] [address] [phone]          Plan Of Care/Follow-up recommendations:  Activity:  as tolerated  Diet:  Diabetic diet  Tests:  NA Other:  See below  Patient is requesting discharge and at this time there are no grounds for involuntary commitment  She is planning on returning home Plans to follow up as above. She plans to follow up with PCP for medical management as needed   Craige Cotta, MD 07/05/2018, 8:13 AM

## 2018-07-05 NOTE — Plan of Care (Signed)
  Problem: Education: Goal: Ability to make informed decisions regarding treatment will improve Outcome: Completed/Met   Problem: Coping: Goal: Coping ability will improve Outcome: Completed/Met   Problem: Health Behavior/Discharge Planning: Goal: Identification of resources available to assist in meeting health care needs will improve Outcome: Completed/Met   Problem: Medication: Goal: Compliance with prescribed medication regimen will improve Outcome: Completed/Met   Problem: Self-Concept: Goal: Ability to disclose and discuss suicidal ideas will improve Outcome: Completed/Met Goal: Will verbalize positive feelings about self Outcome: Completed/Met   Problem: Education: Goal: Utilization of techniques to improve thought processes will improve Outcome: Completed/Met Goal: Knowledge of the prescribed therapeutic regimen will improve Outcome: Completed/Met   Problem: Activity: Goal: Interest or engagement in leisure activities will improve Outcome: Completed/Met Goal: Imbalance in normal sleep/wake cycle will improve Outcome: Completed/Met   Problem: Coping: Goal: Coping ability will improve Outcome: Completed/Met Goal: Will verbalize feelings Outcome: Completed/Met   Problem: Health Behavior/Discharge Planning: Goal: Ability to make decisions will improve Outcome: Completed/Met Goal: Compliance with therapeutic regimen will improve Outcome: Completed/Met   Problem: Role Relationship: Goal: Will demonstrate positive changes in social behaviors and relationships Outcome: Completed/Met   Problem: Safety: Goal: Ability to disclose and discuss suicidal ideas will improve Outcome: Completed/Met Goal: Ability to identify and utilize support systems that promote safety will improve Outcome: Completed/Met   Problem: Self-Concept: Goal: Will verbalize positive feelings about self Outcome: Completed/Met Goal: Level of anxiety will decrease Outcome: Completed/Met    Problem: Education: Goal: Knowledge of [facility] Education information/materials will improve Outcome: Completed/Met Goal: Emotional status will improve Outcome: Completed/Met Goal: Mental status will improve Outcome: Completed/Met Goal: Verbalization of understanding the information provided will improve Outcome: Completed/Met   Problem: Activity: Goal: Interest or engagement in activities will improve Outcome: Completed/Met Goal: Sleeping patterns will improve Outcome: Completed/Met   Problem: Coping: Goal: Ability to verbalize frustrations and anger appropriately will improve Outcome: Completed/Met Goal: Ability to demonstrate self-control will improve Outcome: Completed/Met   Problem: Health Behavior/Discharge Planning: Goal: Identification of resources available to assist in meeting health care needs will improve Outcome: Completed/Met Goal: Compliance with treatment plan for underlying cause of condition will improve Outcome: Completed/Met   Problem: Physical Regulation: Goal: Ability to maintain clinical measurements within normal limits will improve Outcome: Completed/Met   Problem: Safety: Goal: Periods of time without injury will increase Outcome: Completed/Met   Problem: Spiritual Needs Goal: Ability to function at adequate level Outcome: Completed/Met

## 2018-07-05 NOTE — [facility] Group Notes (Signed)
[facility] Group Notes: (Clinical Social Work)   07/05/2018      Type of Therapy:  Group Therapy   Participation Level:  Did Not Attend - was discharged    Ambrose MantleMareida Grossman-Orr, LCSW 07/05/2018, 5:19 PM

## 2018-07-05 NOTE — Progress Notes (Signed)
Writer spoke with patient 1:1 and she reported that her day was so so and she's alive. Writer informed her of her scheduled medications and she c/o stuffy nose and requested medication to aid with this. She has been observed up in the dayroom briefly and on the phone a few times. She reported that she had talked with her boyfriend and hoped he didn't call back. She reports passive si and verbally contracts for safety. Support given and an order will be obtained for her nasal stuffiness. Safety maintained on unit with 15 min checks.

## 2018-07-05 NOTE — Progress Notes (Signed)
  [facility] Discharge [facility] :  Will you be returning to the same living situation after discharge:  Yes,  with stepmother At discharge, do you have transportation home?: Yes,  arranged by patient Do you have the ability to pay for your medications: Yes,  states there are no barriers  Release of information consent forms completed and turned in to Medical Records by CSW.  Patient to Follow up at: Follow-up Information    [facility] Follow up.   Specialty:  Behavioral Health Why:  Your social worker will need to make appointments for you to Dr. Vanetta ShawlHisada for medication management and Florencia ReasonsPeggy Bynum for therapy.  The social worker will call you Monday with this information. Contact information: [address] [place] [id number] [phone]          Next level of care provider has [facility]:yes  Safety Planning and Suicide Prevention discussed: No.  Declined by patient, so discussed with her  Have you used any form of tobacco in the last 30 days? (Cigarettes, Smokeless Tobacco, Cigars, and/or Pipes): No  Has patient been referred to the Quitline?: N/A patient is not a smoker  Patient has been referred for addiction treatment: N/A  Lynnell ChadMareida J Grossman-Orr, LCSW 07/05/2018, 11:28 AM

## 2018-07-05 NOTE — Discharge Summary (Addendum)
Physician Discharge Summary Note  Patient:  Veronica Waters is an 27 y.o., female MRN:  [id number] DOB:  [birth year] Patient phone:  [phone] (home)  Patient address:   [address] [place] [address],  Total Time spent with patient: 15 minutes  Date of Admission:  06/28/2018 Date of Discharge: 07/05/2018  Reason for Admission: Per assessment note-Patient is a 27 year old female with a past psychiatric history significant for reported bipolar disorder, posttraumatic stress disorder, borderline personality disorder who presented to the behavioral health hospital reporting symptoms of depression, anxiety and suicidal ideation.  The patient this a.m. was a poor historian secondary to "being sleepy".  Most the information for the admission H&P and suicide assessment was done from the old charts.  Her chart review revealed that she had decided to stop taking her psychiatric medicines approximately 3 weeks prior to admission.  She did not believe that she thought she needed them any longer.  Approximately 1 week ago she found out that her grandmother died in 03/13/23, and that her family did not tell her about this.  After that she reported increasing symptoms of crying spells, social withdrawal, loss of usual interest in activities, fatigue, irritability, decreased sleep, decreased appetite and feelings of guilt and helplessness.  The patient had been taking 4 tablespoons of NyQuil every other night for sleep but only got 3 hours of sleep the night prior to admission.  The patient has a history of multiple psychiatric admissions and was last hospitalized at [facility] in 2018/03/12.  She had attempted suicide at that time, and was admitted to the ICU.  She also has a history of cutting and numerous scars on both arms.  She apparently had not cut in the last 6 weeks.  Unfortunately her family was out of town this week and during her decompensation.  She was admitted to the hospital for evaluation and  stabilization    Principal Problem: Bipolar 1 disorder, mixed, severe [facility]) Discharge Diagnoses: Patient Active Problem List   Diagnosis Date Noted  . Posttraumatic stress disorder [F43.10]   . Borderline personality disorder (HCC) [F60.3]   . Diabetes mellitus type 2 in obese (HCC) [E11.69, E66.9]   . Bipolar 1 disorder, mixed, severe (HCC) [F31.63] 06/28/2018  . Tachycardia [R00.0] 01/18/2018  . Severe recurrent major depression without psychotic features (HCC) [F33.2] 01/05/2018  . MDD (major depressive disorder), recurrent severe, without psychosis (HCC) [F33.2] 09/01/2017    Past Psychiatric History:   Past Medical History:  Past Medical History:  Diagnosis Date  . Asthma   . Hypertension    History reviewed. No pertinent surgical history. Family History:  Family History  Family history unknown: Yes   Family Psychiatric  History:  Social History:  Social History   Substance and Sexual Activity  Alcohol Use No     Social History   Substance and Sexual Activity  Drug Use No    Social History   Socioeconomic History  . Marital status: Single    Spouse name: Not on file  . Number of children: Not on file  . Years of education: Not on file  . Highest education level: Not on file  Occupational History  . Not on file  Social Needs  . Financial resource strain: Not on file  . Food insecurity:    Worry: Not on file    Inability: Not on file  . Transportation needs:    Medical: Not on file    Non-medical: Not on file  Tobacco  Use  . Smoking status: Never Smoker  . Smokeless tobacco: Never Used  Substance and Sexual Activity  . Alcohol use: No  . Drug use: No  . Sexual activity: Never    Birth control/protection: Other-see comments    Comment: Rod in left arm for birth control  Lifestyle  . Physical activity:    Days per week: Not on file    Minutes per session: Not on file  . Stress: Not on file  Relationships  . Social connections:    Talks on  phone: Not on file    Gets together: Not on file    Attends religious service: Not on file    Active member of club or organization: Not on file    Attends meetings of clubs or organizations: Not on file    Relationship status: Not on file  Other Topics Concern  . Not on file  Social History Narrative  . Not on file    Hospital Course:  Janin Kozlowski was admitted for Bipolar 1 disorder, mixed, severe (HCC) , with psychosis and crisis management.  Pt was treated discharged with the medications listed below under Medication List.  Medical problems were identified and treated as needed.  Home medications were restarted as appropriate.  Improvement was monitored by observation and [name] 's daily report of symptom reduction.  Emotional and mental status was monitored by daily self-inventory reports completed by [name] and clinical staff.         Ceira Hoeschen was evaluated by the treatment team for stability and plans for continued recovery upon discharge. Denishia Citro 's motivation was an integral factor for scheduling further treatment. Employment, transportation, bed availability, health status, family support, and any pending legal issues were also considered during hospital stay. Pt was offered further treatment options upon discharge including but not limited to Residential, Intensive Outpatient, and Outpatient treatment.  Gavyn Zoss will follow up with the services as listed below under Follow Up Information.     Upon completion of this admission the patient was both mentally and medically stable for discharge denying suicidal/homicidal ideation, auditory/visual/tactile hallucinations, delusional thoughts and paranoia.     [name] responded well to treatment with Prozac 10 mg, Neurontin 400 mg  and Seroquel 200 mg Q HS and 50 mg QDwithout adverse effects. Initially, pt did complain of dizziness and drowsiness  with these medications, but this resolved. Pt demonstrated improvement  without reported or observed adverse effects to the point of stability appropriate for outpatient management. Pertinent labs include: Li 04.9 for which outpatient follow-up is necessary for lab recheck as mentioned below. Reviewed CBC, CMP, BAL, and UDS; all unremarkable aside from noted exceptions.   Physical Findings: AIMS: Facial and Oral Movements Muscles of Facial Expression: None, normal Lips and Perioral Area: None, normal Jaw: None, normal Tongue: None, normal,Extremity Movements Upper (arms, wrists, hands, fingers): None, normal Lower (legs, knees, ankles, toes): None, normal, Trunk Movements Neck, shoulders, hips: None, normal, Overall Severity Severity of abnormal movements (highest score from questions above): None, normal Incapacitation due to abnormal movements: None, normal Patient's awareness of abnormal movements (rate only patient's report): No Awareness, Dental Status Current problems with teeth and/or dentures?: No Does patient usually wear dentures?: No  CIWA:    COWS:     Musculoskeletal: Strength & Muscle Tone: within normal limits Gait & Station: normal Patient leans: N/A  Psychiatric Specialty Exam: See SRA by MD Physical Exam  Nursing note and vitals reviewed. Constitutional: She is  oriented to person, place, and time.  Neurological: She is alert and oriented to person, place, and time.  Psychiatric: She has a normal mood and affect. Her behavior is normal.    Review of Systems  Psychiatric/Behavioral: Negative for depression (stable). The patient is not nervous/anxious (stable).   All other systems reviewed and are negative.   Blood pressure (!) 144/91, pulse 96, temperature 97.7 F (36.5 C), temperature source Oral, resp. rate 16, height 5\' 6"  (1.676 m), weight (!) 137 kg (302 lb).Body mass index is 48.74 kg/m.    Have you used any form of tobacco in the last 30 days? (Cigarettes, Smokeless Tobacco, Cigars, and/or Pipes): No  Has this patient used  any form of tobacco in the last 30 days? (Cigarettes, Smokeless Tobacco, Cigars, and/or Pipes) Yes, No  Blood Alcohol level:  Lab Results  Component Value Date   ETH <10 01/18/2018   ETH <5 08/31/2017    Metabolic Disorder Labs:  Lab Results  Component Value Date   HGBA1C 5.4 06/30/2018   MPG 108.28 06/30/2018   MPG 108.28 01/06/2018   Lab Results  Component Value Date   PROLACTIN 23.5 (H) 06/30/2018   Lab Results  Component Value Date   CHOL 230 (H) 06/30/2018   TRIG 366 (H) 06/30/2018   HDL 33 (L) 06/30/2018   CHOLHDL 7.0 06/30/2018   VLDL 73 (H) 06/30/2018   LDLCALC 124 (H) 06/30/2018   LDLCALC 158 (H) 01/06/2018    See Psychiatric Specialty Exam and Suicide Risk Assessment completed by Attending Physician prior to discharge.  Discharge destination:  Home  Is patient on multiple antipsychotic therapies at discharge:  No   Has Patient had three or more failed trials of antipsychotic monotherapy by history:  No  Recommended Plan for Multiple Antipsychotic Therapies: NA  Discharge Instructions    Diet - low sodium heart healthy   Complete by:  As directed    Discharge instructions   Complete by:  As directed    Take all medications as prescribed. Keep all follow-up appointments as scheduled.  Do not consume alcohol or use illegal drugs while on prescription medications. Report any adverse effects from your medications to your primary care provider promptly.  In the event of recurrent symptoms or worsening symptoms, call 911, a crisis hotline, or go to the nearest emergency department for evaluation.   Increase activity slowly   Complete by:  As directed      Allergies as of 07/05/2018      Reactions   Cranberry Hives, Swelling   Aspirin Hives   Vicodin [hydrocodone-acetaminophen] Hives      Medication List    STOP taking these medications   naltrexone 50 MG tablet Commonly known as:  DEPADE     TAKE these medications     Indication  albuterol 108  (90 Base) MCG/ACT inhaler Commonly known as:  PROVENTIL HFA;VENTOLIN HFA Inhale 1-2 puffs into the lungs every 6 (six) hours as needed for wheezing or shortness of breath.  Indication:  Asthma   clonazePAM 0.5 MG tablet Commonly known as:  KLONOPIN Take 0.5 mg by mouth 3 (three) times daily as needed for anxiety.  Indication:  mood stabilz   FLUoxetine 10 MG capsule Commonly known as:  PROZAC Take 1 capsule (10 mg total) by mouth daily. Start taking on:  07/06/2018 What changed:    medication strength  how much to take  Indication:  Depression   gabapentin 400 MG capsule Commonly known as:  NEURONTIN  Take 1 capsule (400 mg total) by mouth 3 (three) times daily. What changed:    medication strength  how much to take  Indication:  Diabetes with Nerve Disease   levothyroxine 50 MCG tablet Commonly known as:  SYNTHROID, LEVOTHROID Take 1 tablet (50 mcg total) by mouth daily before breakfast. Start taking on:  07/06/2018  Indication:  Underactive Thyroid   lisinopril 10 MG tablet Commonly known as:  PRINIVIL,ZESTRIL Take 1 tablet (10 mg total) by mouth daily. Start taking on:  07/06/2018  Indication:  High Blood Pressure Disorder   lithium carbonate 300 MG CR tablet Commonly known as:  LITHOBID Take 2 tablets (600 mg total) by mouth every 12 (twelve) hours. What changed:    medication strength  how much to take  when to take this  Indication:  Depression   metFORMIN 500 MG 24 hr tablet Commonly known as:  GLUCOPHAGE-XR Take 500 mg by mouth daily with breakfast.  Indication:  Type 2 Diabetes   propranolol 20 MG tablet Commonly known as:  INDERAL Take 1 tablet (20 mg total) by mouth 2 (two) times daily.  Indication:  HTN   QUEtiapine 50 MG tablet Commonly known as:  SEROQUEL Take 1 tablet (50 mg total) by mouth 2 (two) times daily. What changed:  when to take this  Indication:  Generalized Anxiety Disorder   QUEtiapine 200 MG tablet Commonly known as:   SEROQUEL Take 1 tablet (200 mg total) by mouth at bedtime. What changed:  You were already taking a medication with the same name, and this prescription was added. Make sure you understand how and when to take each.  Indication:  Generalized Anxiety Disorder      Follow-up Information    [facility] Follow up.   Specialty:  [facility] information: [address] [place] [address] [phone]          Follow-up recommendations:  Activity:  as tolerated Diet:  heart healthy  Comments:  Take all medications as prescribed. Keep all follow-up appointments as scheduled.  Do not consume alcohol or use illegal drugs while on prescription medications. Report any adverse effects from your medications to your primary care provider promptly.  In the event of recurrent symptoms or worsening symptoms, call 911, a crisis hotline, or go to the nearest emergency department for evaluation.   Signed: Oneta Rack, NP 07/05/2018, 9:02 AM   Patient seen, Suicide Assessment Completed.  Disposition Plan Reviewed

## 2018-07-05 NOTE — Progress Notes (Signed)
Discharge note:  Patient discharged home per MD order.  Patient received all personal belongings from unit and locker.  Reviewed AVS/transit;ion record with patient and she indicates understanding.  She denies any thoughts of self harm.  She rates her depression as 4; hopelessness as a 3; anxiety as a 5.  Patient's goal is "to go home."  Patient left ambulatory with her own transportation.

## 2018-07-07 NOTE — Progress Notes (Signed)
CSW completed follow up for this pt, who was discharged on Saturday, 07/05/18.  [facility].  CSW contacted that office and was informed that pt had been referred to them previously but did not attend any appts and is not a current pt.  Next available appt for therapy and med mgmt would be at least one month away.  Pt face sheet indicates that pt is uninsured.  CSW attempted to contact pt to determine insurance status but neither number listed worked.  Later in the afternoon, pt called and CSW spoke with her.  She asked that we use [phone].  Pt confirmed that she is uninsured.  [facility]  CSW informed her that she would be required to pay cash up front to go to [facility] if she is uninsured.  Pt said she would attend [facility] appt 7/17 8:30am. Garner NashGregory Stephonie Wilcoxen, MSW, LCSW Clinical Social Worker 07/07/2018 2:27 PM

## 2018-07-14 ENCOUNTER — Emergency Department (HOSPITAL_COMMUNITY): Payer: Self-pay

## 2018-07-14 ENCOUNTER — Other Ambulatory Visit: Payer: Self-pay

## 2018-07-14 ENCOUNTER — Encounter (HOSPITAL_COMMUNITY): Payer: Self-pay | Admitting: *Deleted

## 2018-07-14 ENCOUNTER — Inpatient Hospital Stay (HOSPITAL_COMMUNITY)
Admission: EM | Admit: 2018-07-14 | Discharge: 2018-07-17 | DRG: 917 | Disposition: A | Discharge status: Other Acute Inpatient Hospital | Payer: Self-pay | Attending: Internal Medicine | Admitting: Internal Medicine

## 2018-07-14 DIAGNOSIS — I959 Hypotension, unspecified: Secondary | ICD-10-CM | POA: Diagnosis present

## 2018-07-14 DIAGNOSIS — Y902 Blood alcohol level of 40-59 mg/100 ml: Secondary | ICD-10-CM | POA: Diagnosis present

## 2018-07-14 DIAGNOSIS — Z79890 Hormone replacement therapy (postmenopausal): Secondary | ICD-10-CM

## 2018-07-14 DIAGNOSIS — Z6841 Body Mass Index (BMI) 40.0 and over, adult: Secondary | ICD-10-CM

## 2018-07-14 DIAGNOSIS — G9349 Other encephalopathy: Secondary | ICD-10-CM | POA: Diagnosis present

## 2018-07-14 DIAGNOSIS — T50901A Poisoning by unspecified drugs, medicaments and biological substances, accidental (unintentional), initial encounter: Secondary | ICD-10-CM | POA: Diagnosis present

## 2018-07-14 DIAGNOSIS — F4310 Post-traumatic stress disorder, unspecified: Secondary | ICD-10-CM | POA: Diagnosis present

## 2018-07-14 DIAGNOSIS — T43592A Poisoning by other antipsychotics and neuroleptics, intentional self-harm, initial encounter: Principal | ICD-10-CM | POA: Diagnosis present

## 2018-07-14 DIAGNOSIS — Z886 Allergy status to analgesic agent status: Secondary | ICD-10-CM

## 2018-07-14 DIAGNOSIS — T50902A Poisoning by unspecified drugs, medicaments and biological substances, intentional self-harm, initial encounter: Secondary | ICD-10-CM

## 2018-07-14 DIAGNOSIS — Z885 Allergy status to narcotic agent status: Secondary | ICD-10-CM

## 2018-07-14 DIAGNOSIS — J9601 Acute respiratory failure with hypoxia: Secondary | ICD-10-CM | POA: Diagnosis present

## 2018-07-14 DIAGNOSIS — T1491XA Suicide attempt, initial encounter: Secondary | ICD-10-CM

## 2018-07-14 DIAGNOSIS — Z9911 Dependence on respirator [ventilator] status: Secondary | ICD-10-CM

## 2018-07-14 DIAGNOSIS — Z91018 Allergy to other foods: Secondary | ICD-10-CM

## 2018-07-14 DIAGNOSIS — Z978 Presence of other specified devices: Secondary | ICD-10-CM

## 2018-07-14 DIAGNOSIS — F3163 Bipolar disorder, current episode mixed, severe, without psychotic features: Secondary | ICD-10-CM | POA: Diagnosis present

## 2018-07-14 DIAGNOSIS — I10 Essential (primary) hypertension: Secondary | ICD-10-CM | POA: Diagnosis not present

## 2018-07-14 DIAGNOSIS — R45851 Suicidal ideations: Secondary | ICD-10-CM | POA: Diagnosis present

## 2018-07-14 DIAGNOSIS — E119 Type 2 diabetes mellitus without complications: Secondary | ICD-10-CM | POA: Diagnosis present

## 2018-07-14 DIAGNOSIS — E039 Hypothyroidism, unspecified: Secondary | ICD-10-CM | POA: Diagnosis present

## 2018-07-14 DIAGNOSIS — Z7984 Long term (current) use of oral hypoglycemic drugs: Secondary | ICD-10-CM

## 2018-07-14 DIAGNOSIS — Z915 Personal history of self-harm: Secondary | ICD-10-CM

## 2018-07-14 DIAGNOSIS — Z79899 Other long term (current) drug therapy: Secondary | ICD-10-CM

## 2018-07-14 DIAGNOSIS — J9600 Acute respiratory failure, unspecified whether with hypoxia or hypercapnia: Secondary | ICD-10-CM

## 2018-07-14 HISTORY — DX: Major depressive disorder, single episode, unspecified: F32.9

## 2018-07-14 LAB — I-STAT BETA HCG BLOOD, ED (MC, WL, AP ONLY)

## 2018-07-14 LAB — TSH: TSH: 1.73 u[IU]/mL (ref 0.350–4.500)

## 2018-07-14 LAB — COMPREHENSIVE METABOLIC PANEL
ALK PHOS: 67 U/L (ref 38–126)
ALT: 22 U/L (ref 0–44)
ALT: 34 U/L (ref 0–44)
ANION GAP: 4 — AB (ref 5–15)
AST: 17 U/L (ref 15–41)
AST: 27 U/L (ref 15–41)
Albumin: 3 g/dL — ABNORMAL LOW (ref 3.5–5.0)
Albumin: 3.1 g/dL — ABNORMAL LOW (ref 3.5–5.0)
Alkaline Phosphatase: 67 U/L (ref 38–126)
Anion gap: 8 (ref 5–15)
BUN: 6 mg/dL (ref 6–20)
BUN: 6 mg/dL (ref 6–20)
CALCIUM: 8.1 mg/dL — AB (ref 8.9–10.3)
CHLORIDE: 114 mmol/L — AB (ref 98–111)
CO2: 22 mmol/L (ref 22–32)
CO2: 25 mmol/L (ref 22–32)
CREATININE: 0.7 mg/dL (ref 0.44–1.00)
Calcium: 8.4 mg/dL — ABNORMAL LOW (ref 8.9–10.3)
Chloride: 114 mmol/L — ABNORMAL HIGH (ref 98–111)
Creatinine, Ser: 0.68 mg/dL (ref 0.44–1.00)
GFR calc non Af Amer: >60 mL/min (ref >60)
GFR calc non Af Amer: >60 mL/min (ref >60)
Glucose, Bld: 98 mg/dL (ref 70–99)
Glucose, Bld: 109 mg/dL — ABNORMAL HIGH (ref 70–99)
Potassium: 3.9 mmol/L (ref 3.5–5.1)
Potassium: 4 mmol/L (ref 3.5–5.1)
SODIUM: 143 mmol/L (ref 135–145)
Sodium: 144 mmol/L (ref 135–145)
Total Bilirubin: 0.2 mg/dL — ABNORMAL LOW (ref 0.3–1.2)
Total Bilirubin: 0.4 mg/dL (ref 0.3–1.2)
Total Protein: 6.8 g/dL (ref 6.5–8.1)
Total Protein: 7.1 g/dL (ref 6.5–8.1)

## 2018-07-14 LAB — ACETAMINOPHEN LEVEL
Acetaminophen (Tylenol), Serum: <10 ug/mL — ABNORMAL LOW (ref 10–30)
Acetaminophen (Tylenol), Serum: <10 ug/mL — ABNORMAL LOW (ref 10–30)

## 2018-07-14 LAB — BLOOD GAS, ARTERIAL
Acid-base deficit: 5 mmol/L — ABNORMAL HIGH (ref 0.0–2.0)
Bicarbonate: 20.3 mmol/L (ref 20.0–28.0)
Drawn by: 232811
FIO2: 70
MECHVT: 470 mL
O2 Saturation: 99.1 %
PCO2 ART: 41 mmHg (ref 32.0–48.0)
PEEP/CPAP: 5 cmH2O
Patient temperature: 36.9
RATE: 20 resp/min
pH, Arterial: 7.315 — ABNORMAL LOW (ref 7.350–7.450)
pO2, Arterial: 260 mmHg — ABNORMAL HIGH (ref 83.0–108.0)

## 2018-07-14 LAB — CBC WITH DIFFERENTIAL/PLATELET
BASOS ABS: 0 10*3/uL (ref 0.0–0.1)
BASOS PCT: 1 %
EOS PCT: 2 %
Eosinophils Absolute: 0.2 10*3/uL (ref 0.0–0.7)
HCT: 35.3 % — ABNORMAL LOW (ref 36.0–46.0)
Hemoglobin: 11.2 g/dL — ABNORMAL LOW (ref 12.0–15.0)
LYMPHS PCT: 30 %
Lymphs Abs: 2.2 10*3/uL (ref 0.7–4.0)
MCH: 27.3 pg (ref 26.0–34.0)
MCHC: 31.7 g/dL (ref 30.0–36.0)
MCV: 85.9 fL (ref 78.0–100.0)
Monocytes Absolute: 0.5 10*3/uL (ref 0.1–1.0)
Monocytes Relative: 7 %
NEUTROS ABS: 4.5 10*3/uL (ref 1.7–7.7)
Neutrophils Relative %: 60 %
PLATELETS: 383 10*3/uL (ref 150–400)
RBC: 4.11 MIL/uL (ref 3.87–5.11)
RDW: 14 % (ref 11.5–15.5)
WBC: 7.4 10*3/uL (ref 4.0–10.5)

## 2018-07-14 LAB — BASIC METABOLIC PANEL
Anion gap: 4 — ABNORMAL LOW (ref 5–15)
BUN: 6 mg/dL (ref 6–20)
CALCIUM: 8.6 mg/dL — AB (ref 8.9–10.3)
CO2: 25 mmol/L (ref 22–32)
Chloride: 115 mmol/L — ABNORMAL HIGH (ref 98–111)
Creatinine, Ser: 0.71 mg/dL (ref 0.44–1.00)
GFR calc Af Amer: >60 mL/min (ref >60)
GFR calc non Af Amer: >60 mL/min (ref >60)
Glucose, Bld: 91 mg/dL (ref 70–99)
Potassium: 3.9 mmol/L (ref 3.5–5.1)
Sodium: 144 mmol/L (ref 135–145)

## 2018-07-14 LAB — LITHIUM LEVEL
LITHIUM LVL: 1.83 mmol/L — AB (ref 0.60–1.20)
Lithium Lvl: 0.88 mmol/L (ref 0.60–1.20)
Lithium Lvl: 1.27 mmol/L — ABNORMAL HIGH (ref 0.60–1.20)
Lithium Lvl: 2.7 mmol/L (ref 0.60–1.20)

## 2018-07-14 LAB — CBC
HEMATOCRIT: 34.6 % — AB (ref 36.0–46.0)
Hemoglobin: 11.2 g/dL — ABNORMAL LOW (ref 12.0–15.0)
MCH: 27.8 pg (ref 26.0–34.0)
MCHC: 32.4 g/dL (ref 30.0–36.0)
MCV: 85.9 fL (ref 78.0–100.0)
Platelets: 359 10*3/uL (ref 150–400)
RBC: 4.03 MIL/uL (ref 3.87–5.11)
RDW: 13.9 % (ref 11.5–15.5)
WBC: 7.3 10*3/uL (ref 4.0–10.5)

## 2018-07-14 LAB — RAPID URINE DRUG SCREEN, HOSP PERFORMED
Amphetamines: NOT DETECTED
BARBITURATES: NOT DETECTED
Benzodiazepines: NOT DETECTED
Cocaine: NOT DETECTED
OPIATES: NOT DETECTED
Tetrahydrocannabinol: NOT DETECTED

## 2018-07-14 LAB — GLUCOSE, CAPILLARY
GLUCOSE-CAPILLARY: 125 mg/dL — AB (ref 70–99)
GLUCOSE-CAPILLARY: 77 mg/dL (ref 70–99)
GLUCOSE-CAPILLARY: 80 mg/dL (ref 70–99)
GLUCOSE-CAPILLARY: 99 mg/dL (ref 70–99)
Glucose-Capillary: 99 mg/dL (ref 70–99)
Glucose-Capillary: 83 mg/dL (ref 70–99)

## 2018-07-14 LAB — SALICYLATE LEVEL

## 2018-07-14 LAB — CBG MONITORING, ED: Glucose-Capillary: 91 mg/dL (ref 70–99)

## 2018-07-14 LAB — MAGNESIUM: Magnesium: 2.2 mg/dL (ref 1.7–2.4)

## 2018-07-14 LAB — PHOSPHORUS: PHOSPHORUS: 3.7 mg/dL (ref 2.5–4.6)

## 2018-07-14 LAB — MRSA PCR SCREENING: MRSA BY PCR: NEGATIVE

## 2018-07-14 LAB — ETHANOL: Alcohol, Ethyl (B): 40 mg/dL — ABNORMAL HIGH (ref <10)

## 2018-07-14 MED ORDER — FOLIC ACID 1 MG PO TABS
1.0000 mg | ORAL_TABLET | Freq: Every day | ORAL | Status: DC
  Administered 2018-07-15 – 2018-07-16 (×2): 1 mg
  Filled 2018-07-14 (×3): qty 1

## 2018-07-14 MED ORDER — HEPARIN SODIUM (PORCINE) 5000 UNIT/ML IJ SOLN
5000.0000 [IU] | Freq: Three times a day (TID) | INTRAMUSCULAR | Status: DC
  Administered 2018-07-14 – 2018-07-17 (×8): 5000 [IU] via SUBCUTANEOUS
  Filled 2018-07-14 (×6): qty 1

## 2018-07-14 MED ORDER — FENTANYL CITRATE (PF) 100 MCG/2ML IJ SOLN: 100.0000 ug | INTRAMUSCULAR | Status: DC | PRN

## 2018-07-14 MED ORDER — FOLIC ACID 5 MG/ML IJ SOLN
1.0000 mg | Freq: Every day | INTRAMUSCULAR | Status: DC
  Administered 2018-07-14: 1 mg via INTRAVENOUS
  Filled 2018-07-14: qty 0.2

## 2018-07-14 MED ORDER — CHLORHEXIDINE GLUCONATE CLOTH 2 % EX PADS
6.0000 | MEDICATED_PAD | Freq: Every day | CUTANEOUS | Status: DC
  Administered 2018-07-15 – 2018-07-17 (×3): 6 via TOPICAL

## 2018-07-14 MED ORDER — PANTOPRAZOLE SODIUM 40 MG PO PACK
40.0000 mg | PACK | Freq: Every day | ORAL | Status: DC
  Administered 2018-07-14: 40 mg
  Filled 2018-07-14: qty 20

## 2018-07-14 MED ORDER — FENTANYL CITRATE (PF) 100 MCG/2ML IJ SOLN
100.0000 ug | INTRAMUSCULAR | Status: DC | PRN
  Administered 2018-07-14: 100 ug via INTRAVENOUS
  Filled 2018-07-14: qty 2

## 2018-07-14 MED ORDER — INSULIN ASPART 100 UNIT/ML ~~LOC~~ SOLN: 0.0000 [IU] | SUBCUTANEOUS | Status: DC

## 2018-07-14 MED ORDER — SODIUM CHLORIDE 0.9 % IV SOLN
INTRAVENOUS | Status: DC
  Administered 2018-07-14 – 2018-07-15 (×3): via INTRAVENOUS
  Administered 2018-07-15 – 2018-07-16 (×4): 250 mL/h via INTRAVENOUS

## 2018-07-14 MED ORDER — LEVOTHYROXINE SODIUM 50 MCG PO TABS
50.0000 ug | ORAL_TABLET | Freq: Every day | ORAL | Status: DC
  Administered 2018-07-15 – 2018-07-17 (×3): 50 ug
  Filled 2018-07-14 (×3): qty 1

## 2018-07-14 MED ORDER — SODIUM CHLORIDE 0.9 % IV SOLN: 250.0000 mL | INTRAVENOUS | Status: DC | PRN

## 2018-07-14 MED ORDER — SUCCINYLCHOLINE CHLORIDE 20 MG/ML IJ SOLN
150.0000 mg | Freq: Once | INTRAMUSCULAR | Status: AC
  Administered 2018-07-14: 150 mg via INTRAVENOUS
  Filled 2018-07-14: qty 7.5

## 2018-07-14 MED ORDER — VITAMIN B-1 100 MG PO TABS
100.0000 mg | ORAL_TABLET | Freq: Every day | ORAL | Status: DC
  Administered 2018-07-15 – 2018-07-16 (×2): 100 mg
  Filled 2018-07-14 (×3): qty 1

## 2018-07-14 MED ORDER — ETOMIDATE 2 MG/ML IV SOLN
20.0000 mg | Freq: Once | INTRAVENOUS | Status: AC
  Administered 2018-07-14: 20 mg via INTRAVENOUS

## 2018-07-14 MED ORDER — SODIUM CHLORIDE 0.9 % IV BOLUS
1000.0000 mL | Freq: Once | INTRAVENOUS | Status: AC
  Administered 2018-07-14: 1000 mL via INTRAVENOUS

## 2018-07-14 MED ORDER — THIAMINE HCL 100 MG/ML IJ SOLN
100.0000 mg | Freq: Every day | INTRAMUSCULAR | Status: DC
  Administered 2018-07-14: 100 mg via INTRAVENOUS
  Filled 2018-07-14: qty 2

## 2018-07-14 NOTE — ED Triage Notes (Addendum)
Pt arrives from home via [facility]. BF at scene stated pt expressed SI and depression over several days. Intentional overdose around 9pm, of potentially seroquel, naltroxone, lithium, propanol, fluoxetine, lisinopril. EMS attempted oral and NG charcoal, pt became combative. Pt also rec'd NS 1000 IV x 2 established en route, 150/90, hr 87, 98% cbg 165

## 2018-07-14 NOTE — H&P (Signed)
PULMONARY / CRITICAL CARE MEDICINE   Name: Veronica Waters MRN: [id number] DOB: 1991/08/01    ADMISSION DATE:  07/14/2018 CONSULTATION DATE:  07/14/2018  REFERRING MD:  Dr. Read DriversMolpus   CHIEF COMPLAINT:  OD  HISTORY OF PRESENT ILLNESS:   27 year old female with PMH of HTN, Asthma, Major Depressive Disorder   Presents to ED on 7/22 with Suicide Attempt with multiple medications. Boyfriend reports that round 9 pm patient reported taking Seroquel, naltrexone, lithium, propanol, fluoxetine, lisinopril. On arrival to ED patient unresponsive. Due to airway concerns intubated. PCCM asked to admit.   PAST MEDICAL HISTORY :  She  has a past medical history of Asthma, Hypertension, and Major depressive disorder.  PAST SURGICAL HISTORY: She  has no past surgical history on file.  Allergies  Allergen Reactions  . Aspirin Hives  . Cranberry Hives and Swelling  . Cranberry Extract Anaphylaxis  . Hydrocodone-Acetaminophen Hives  . Hydrocodone Hives  . Trazodone And Nefazodone Other (See Comments)    hallucinations   . Vicodin [Hydrocodone-Acetaminophen] Hives    No current facility-administered medications on file prior to encounter.    Current Outpatient Medications on File Prior to Encounter  Medication Sig  . albuterol (PROVENTIL HFA;VENTOLIN HFA) 108 (90 Base) MCG/ACT inhaler Inhale 1-2 puffs into the lungs every 6 (six) hours as needed for wheezing or shortness of breath.  . clonazePAM (KLONOPIN) 0.5 MG tablet Take 0.5 mg by mouth 3 (three) times daily as needed for anxiety.  Marland Kitchen. FLUoxetine (PROZAC) 10 MG capsule Take 1 capsule (10 mg total) by mouth daily.  Marland Kitchen. gabapentin (NEURONTIN) 300 MG capsule Take 600 mg by mouth 3 (three) times daily.  Marland Kitchen. gabapentin (NEURONTIN) 400 MG capsule Take 1 capsule (400 mg total) by mouth 3 (three) times daily.  Marland Kitchen. levothyroxine (SYNTHROID, LEVOTHROID) 50 MCG tablet Take 1 tablet (50 mcg total) by mouth daily before breakfast.  . lithium carbonate (ESKALITH) 450  MG CR tablet Take 450 mg by mouth every 12 (twelve) hours.  . Melatonin 3 MG TABS Take 3 mg by mouth daily as needed.  . metFORMIN (GLUCOPHAGE-XR) 500 MG 24 hr tablet Take 500 mg by mouth daily with breakfast.  . naltrexone (DEPADE) 50 MG tablet Take 25 mg by mouth daily.  . propranolol (INDERAL) 20 MG tablet Take 1 tablet (20 mg total) by mouth 2 (two) times daily.  . Pseudoeph-Doxylamine-DM-APAP (NYQUIL D COLD/FLU PO) Take 30 mLs by mouth every 6 (six) hours.  Marland Kitchen. QUEtiapine (SEROQUEL) 200 MG tablet Take 1 tablet (200 mg total) by mouth at bedtime.  Marland Kitchen. QUEtiapine (SEROQUEL) 50 MG tablet Take 1 tablet (50 mg total) by mouth 2 (two) times daily.  Marland Kitchen. lisinopril (PRINIVIL,ZESTRIL) 10 MG tablet Take 1 tablet (10 mg total) by mouth daily.  Marland Kitchen. lithium carbonate (LITHOBID) 300 MG CR tablet Take 2 tablets (600 mg total) by mouth every 12 (twelve) hours.    FAMILY HISTORY:  Her Family history is unknown by patient.  SOCIAL HISTORY: She  reports that she has never smoked. She has never used smokeless tobacco. She reports that she does not drink alcohol or use drugs.  REVIEW OF SYSTEMS:   Unable to review as patient is intubated/sedated   SUBJECTIVE:   VITAL SIGNS: BP (!) 103/55   Pulse 72   Temp 98.6 F (37 C)   Resp (!) 24   Ht 5\' 6"  (1.676 m)   SpO2 100%   BMI 48.74 kg/m   HEMODYNAMICS:    VENTILATOR SETTINGS: Vent  Mode: PRVC FiO2 (%):  [40 %-70 %] 40 % Set Rate:  [20 bmp] 20 bmp Vt Set:  [470 mL] 470 mL PEEP:  [5 cmH20] 5 cmH20 Plateau Pressure:  [26 cmH20-29 cmH20] 26 cmH20  INTAKE / OUTPUT: No intake/output data recorded.  PHYSICAL EXAMINATION: General:  Obese Adult female Neuro:  Sedated, pupils intact, does not follow commands  HEENT:  Dry MM  Cardiovascular:  RRR, no MRG  Lungs:  Clear breath sounds, no wheeze Abdomen:  Obese, active bowel sounds  Musculoskeletal:  -edema  Skin:  Warm, dry   LABS:  BMET Recent Labs  Lab 07/14/18 0021  NA 144  K 3.9  CL 114*   CO2 22  BUN 6  CREATININE 0.70  GLUCOSE 98    Electrolytes Recent Labs  Lab 07/14/18 0021  CALCIUM 8.1*    CBC Recent Labs  Lab 07/14/18 0021  WBC 7.4  7.3  HGB 11.2*  11.2*  HCT 35.3*  34.6*  PLT 383  359    Coag's No results for input(s): APTT, INR in the last 168 hours.  Sepsis Markers No results for input(s): LATICACIDVEN, PROCALCITON, O2SATVEN in the last 168 hours.  ABG Recent Labs  Lab 07/14/18 0220  PHART 7.315*  PCO2ART 41.0  PO2ART 260*    Liver Enzymes Recent Labs  Lab 07/14/18 0021  AST 17  ALT 22  ALKPHOS 67  BILITOT 0.2*  ALBUMIN 3.0*    Cardiac Enzymes No results for input(s): TROPONINI, PROBNP in the last 168 hours.  Glucose Recent Labs  Lab 07/14/18 0046  GLUCAP 91    Imaging Dg Abdomen 1 View  Result Date: 07/14/2018 CLINICAL DATA:  NG tube placement EXAM: ABDOMEN - 1 VIEW COMPARISON:  None. FINDINGS: Enteric tube tip is in the right upper quadrant consistent with location in the distal stomach. IMPRESSION: Enteric tube tip is in the right upper quadrant consistent with location in the distal stomach. Electronically Signed   By: Burman Nieves M.D.   On: 07/14/2018 02:17   Dg Chest Portable 1 View  Result Date: 07/14/2018 CLINICAL DATA:  Reposition endotracheal tube EXAM: PORTABLE CHEST 1 VIEW COMPARISON:  07/14/2018 FINDINGS: Endotracheal tube tip measures 3.8 cm above the carina, demonstrating improved position since previous study. Enteric tube tip is off the field of view but below the left hemidiaphragm. Shallow inspiration. Cardiac enlargement. No focal airspace disease. No blunting of costophrenic angles. IMPRESSION: Endotracheal tube has been repositioned with tip now measuring 3.8 cm above the carina. Electronically Signed   By: Burman Nieves M.D.   On: 07/14/2018 02:21   Dg Chest Portable 1 View  Result Date: 07/14/2018 CLINICAL DATA:  Intubated EXAM: PORTABLE CHEST 1 VIEW COMPARISON:  None. FINDINGS: Low  lung volumes. Tip of the endotracheal tube overlies the proximal right mainstem bronchus. Esophageal tube tip is below the diaphragm but non included. Cardiomegaly with mild central vascular congestion. No pleural effusion or pneumothorax IMPRESSION: 1. Tip of the endotracheal tube projects over the proximal right mainstem bronchus 2. Cardiomegaly with central vascular congestion Critical Value/emergent results were called by telephone at the time of interpretation on 07/14/2018 at 1:35 am to Dr. Paula Libra , who verbally acknowledged these results. Electronically Signed   By: Jasmine Pang M.D.   On: 07/14/2018 01:35     STUDIES:  CXR 7/22 > Low lung volumes. Tip of the endotracheal tube overlies the proximal right mainstem bronchus. Esophageal tube tip is below the diaphragm but non included. Cardiomegaly with  mild central vascular congestion. No pleural effusion or pneumothorax  CULTURES: None.   ANTIBIOTICS: None.   SIGNIFICANT EVENTS: 7/21 > Presents to ED   LINES/TUBES: ETT 7/22 >>   DISCUSSION: 27 year old female with major depressive disorder presents after Suicide Attempt by OD of multiple medications   ASSESSMENT / PLAN:  Respiratory Insufficiency in setting of Acute Encephalopathy P:   Vent Support  Trend ABG/CXR VAP Bundle  Pulmonary Hygiene   Prolonged QTC   -Seroquel OD  -451  P:  Cardiac Monitoring Repeat EKG    SUP P:   NPO PPI   VTE P:  Trend CBC  Heparin SQ for VTE SCDS  DM  Hypothyroidism  P:   Trend Glucose  SSI  Suicide Attempt with OD > Unknown Quantity of Medications -UDS Negative  -ETOH 40  H/O Major Depressive Disorder, ETOH P:   RASS goal: 0/-1 Fentanyl PRN to achieve RASS  Psych Evaluation Once Extubated with Suicide Sitter  Repeat Lithium Level    FAMILY  - Updates: No family at bedside   - Inter-disciplinary family meet or Palliative Care meeting due by:  07/21/2018   Jovita Kussmaul, AGACNP-BC [facility]  Pgr: [phone]  PCCM Pgr: [phone]

## 2018-07-14 NOTE — Progress Notes (Addendum)
PULMONARY / CRITICAL CARE MEDICINE   Name: Veronica Waters MRN: [id number] DOB: 1991/02/06    ADMISSION DATE:  07/14/2018 CONSULTATION DATE:  07/14/2018  REFERRING MD:  Dr. Read Drivers   CHIEF COMPLAINT:  OD  HISTORY OF PRESENT ILLNESS:   27 year old female with PMH of HTN, Asthma, Major Depressive Disorder   Presents to ED on 7/22 with Suicide Attempt with multiple medications. Boyfriend reports that round 9 pm patient reported taking Seroquel, naltrexone, lithium, propanol, fluoxetine, lisinopril. On arrival to ED patient unresponsive. Due to airway concerns intubated. PCCM asked to admit.   SUBJECTIVE/INTERVAL 7/22 transferred to ICU intubated. Most recent QTc at 0130 451. Litium lvl 1.27 (increased from 0.88), APAP < 10,   VITAL SIGNS: Blood Pressure 132/87   Pulse 75   Temperature 99 F (37.2 C)   Respiration (Abnormal) 22   Height 5\' 6"  (1.676 m)   Weight (Abnormal) 311 lb 4.6 oz (141.2 kg)   Oxygen Saturation 94%   Body Mass Index 50.24 kg/m   HEMODYNAMICS:    VENTILATOR SETTINGS: Vent Mode: CPAP;PSV FiO2 (%):  [40 %-70 %] 40 % Set Rate:  [20 bmp] 20 bmp Vt Set:  [470 mL] 470 mL PEEP:  [5 cmH20] 5 cmH20 Pressure Support:  [5 cmH20] 5 cmH20 Plateau Pressure:  [23 cmH20-29 cmH20] 23 cmH20  INTAKE / OUTPUT: I/O last 3 completed shifts: In: 1182.9 [I.V.:182.5; IV Piggyback:1000.4] Out: 375 [Urine:375]  PHYSICAL EXAMINATION: General: Is a 27 year old female patient currently lethargic on the ventilator HEENT normocephalic atraumatic no jugular venous distention orally intubated Pulmonary: Clear to auscultation mechanically assisted ventilated breaths equal chest rise Cardiac: Regular rate and rhythm Abdomen: Soft nontender Extremities: Brisk cap refill no edema multiple tattoos Neuro: Awakens briefly, then falls back asleep moves all extremities no focal deficits.   LABS:  BMET Recent Labs  Lab 07/14/18 0021  NA 144  K 3.9  CL 114*  CO2 22  BUN 6  CREATININE  0.70  GLUCOSE 98    Electrolytes Recent Labs  Lab 07/14/18 0021 07/14/18 0522  CALCIUM 8.1*  --   MG  --  2.2  PHOS  --  3.7    CBC Recent Labs  Lab 07/14/18 0021  WBC 7.4  7.3  HGB 11.2*  11.2*  HCT 35.3*  34.6*  PLT 383  359    Coag's No results for input(s): APTT, INR in the last 168 hours.  Sepsis Markers No results for input(s): LATICACIDVEN, PROCALCITON, O2SATVEN in the last 168 hours.  ABG Recent Labs  Lab 07/14/18 0220  PHART 7.315*  PCO2ART 41.0  PO2ART 260*    Liver Enzymes Recent Labs  Lab 07/14/18 0021  AST 17  ALT 22  ALKPHOS 67  BILITOT 0.2*  ALBUMIN 3.0*    Cardiac Enzymes No results for input(s): TROPONINI, PROBNP in the last 168 hours.  Glucose Recent Labs  Lab 07/14/18 0046 07/14/18 0444 07/14/18 0749  GLUCAP 91 77 125*    Imaging Dg Abdomen 1 View  Result Date: 07/14/2018 CLINICAL DATA:  NG tube placement EXAM: ABDOMEN - 1 VIEW COMPARISON:  None. FINDINGS: Enteric tube tip is in the right upper quadrant consistent with location in the distal stomach. IMPRESSION: Enteric tube tip is in the right upper quadrant consistent with location in the distal stomach. Electronically Signed   By: Burman Nieves M.D.   On: 07/14/2018 02:17   Dg Chest Portable 1 View  Result Date: 07/14/2018 CLINICAL DATA:  Reposition endotracheal tube EXAM: PORTABLE  CHEST 1 VIEW COMPARISON:  07/14/2018 FINDINGS: Endotracheal tube tip measures 3.8 cm above the carina, demonstrating improved position since previous study. Enteric tube tip is off the field of view but below the left hemidiaphragm. Shallow inspiration. Cardiac enlargement. No focal airspace disease. No blunting of costophrenic angles. IMPRESSION: Endotracheal tube has been repositioned with tip now measuring 3.8 cm above the carina. Electronically Signed   By: Burman NievesWilliam  Stevens M.D.   On: 07/14/2018 02:21   Dg Chest Portable 1 View  Result Date: 07/14/2018 CLINICAL DATA:  Intubated EXAM:  PORTABLE CHEST 1 VIEW COMPARISON:  None. FINDINGS: Low lung volumes. Tip of the endotracheal tube overlies the proximal right mainstem bronchus. Esophageal tube tip is below the diaphragm but non included. Cardiomegaly with mild central vascular congestion. No pleural effusion or pneumothorax IMPRESSION: 1. Tip of the endotracheal tube projects over the proximal right mainstem bronchus 2. Cardiomegaly with central vascular congestion Critical Value/emergent results were called by telephone at the time of interpretation on 07/14/2018 at 1:35 am to Dr. Paula LibraJOHN MOLPUS , who verbally acknowledged these results. Electronically Signed   By: Jasmine PangKim  Fujinaga M.D.   On: 07/14/2018 01:35     STUDIES:  CXR 7/22 > Low lung volumes. Tip of the endotracheal tube overlies the proximal right mainstem bronchus. Esophageal tube tip is below the diaphragm but non included. Cardiomegaly with mild central vascular congestion. No pleural effusion or pneumothorax  CULTURES: None.   ANTIBIOTICS: None.   SIGNIFICANT EVENTS: 7/21 > Presents to ED   LINES/TUBES: ETT 7/22 >>   DISCUSSION: 27 year old female with major depressive disorder presents after Suicide Attempt by OD of multiple medications   ASSESSMENT / PLAN:  Suicide Attempt with OD > Unknown Quantity of Medications H/O Major Depressive Disorder, ETOH -UDS Negative  -ETOH 40  Plan Continue IV fluids, poison control recommending normal saline, we will continue this at 150 mL an hour per their guidelines Continue telemetry monitoring Peak QTC/twelve-lead Continue supportive measures Will need psychiatry evaluation and suicide sitter Repeat Tylenol level  Respiratory Insufficiency in setting of Acute Encephalopathy -pcxr: rotated. ETT good position. Low volume. Can't exclude left sided airspace disease.  -abg reviewed: acceptable -no fvr or leukocytosis -Appears comfortable on pressure support ventilation but still sleepy Plan Continue airway  protection and mechanical ventilation Extubate when mental status allows A.m. chest x-ray VAP interventions  Prolonged QTC   -Seroquel OD  -451  Plan Every 12 hour 12 leads  DM  Plan Sliding scale insulin  Hypothyroidism  Plan Continue Synthroid, check TSH    FAMILY  - Updates: No family at bedside   - Inter-disciplinary family meet or Palliative Care meeting due by:  07/21/2018  DVT prophylaxis: [place] heparin  SUP: PPI Diet: NPO Activity: BR Disposition : ICU  Simonne MartinetPeter E Cary Lothrop ACNP-BC [facility] Pager # [phone] OR # [phone] if no answer

## 2018-07-14 NOTE — ED Provider Notes (Signed)
[facility]-EMERGENCY DEPT Provider Note: Lowella Dell, MD, FACEP  CSN: [id number] MRN: [id number] ARRIVAL: 07/14/18 at 0016 ROOM: RESB/RESB   CHIEF COMPLAINT  Ingestion  Level 5 caveat: Altered mental status HISTORY OF PRESENT ILLNESS  07/14/18 Seen on Arrival Veronica Waters is a 27 y.o. female with a history of major depressive disorder on multiple psychiatric medications including Seroquel and lithium.  She is here with an overdose of an unknown quantity of medications reportedly taken about 9:30 PM.  She did not inform her boyfriend of this until several hours later and admitted it was a suicide attempt.  He reported recent suicidal thoughts and depressive symptoms.  On scene she was conversant with EMS and attempted to drink several sips of activated charcoal solution but was unable to consume a significant amount.  They attempted to place an NG tube which was unsuccessful.  She has had a declining level of consciousness in route and is now minimally responsive except to noxious stimuli.  She was also noted to be hypotensive on arrival and initial 1 L normal saline fluid bolus was initiated.    Past Medical History:  Diagnosis Date  . Asthma   . Hypertension   . Major depressive disorder     History reviewed. No pertinent surgical history.  Family History  Family history unknown: Yes    Social History   Tobacco Use  . Smoking status: Never Smoker  . Smokeless tobacco: Never Used  Substance Use Topics  . Alcohol use: No  . Drug use: No    Prior to Admission medications   Medication Sig Start Date End Date Taking? Authorizing Provider  albuterol (PROVENTIL HFA;VENTOLIN HFA) 108 (90 Base) MCG/ACT inhaler Inhale 1-2 puffs into the lungs every 6 (six) hours as needed for wheezing or shortness of breath. 01/14/18  Yes Money, Gerlene Burdock, FNP  clonazePAM (KLONOPIN) 0.5 MG tablet Take 0.5 mg by mouth 3 (three) times daily as needed for anxiety.   Yes [provider]  FLUoxetine  (PROZAC) 10 MG capsule Take 1 capsule (10 mg total) by mouth daily. 07/06/18  Yes Oneta Rack, NP  gabapentin (NEURONTIN) 300 MG capsule Take 600 mg by mouth 3 (three) times daily.   Yes [provider]  gabapentin (NEURONTIN) 400 MG capsule Take 1 capsule (400 mg total) by mouth 3 (three) times daily. 07/05/18  Yes Oneta Rack, NP  levothyroxine (SYNTHROID, LEVOTHROID) 50 MCG tablet Take 1 tablet (50 mcg total) by mouth daily before breakfast. 07/06/18  Yes Oneta Rack, NP  lithium carbonate (ESKALITH) 450 MG CR tablet Take 450 mg by mouth every 12 (twelve) hours. 04/14/18  Yes [provider]  Melatonin 3 MG TABS Take 3 mg by mouth daily as needed.   Yes [provider]  metFORMIN (GLUCOPHAGE-XR) 500 MG 24 hr tablet Take 500 mg by mouth daily with breakfast.   Yes [provider]  naltrexone (DEPADE) 50 MG tablet Take 25 mg by mouth daily. 04/15/18 04/15/19 Yes [provider]  propranolol (INDERAL) 20 MG tablet Take 1 tablet (20 mg total) by mouth 2 (two) times daily. 07/05/18  Yes Oneta Rack, NP  Pseudoeph-Doxylamine-DM-APAP (NYQUIL D COLD/FLU PO) Take 30 mLs by mouth every 6 (six) hours.   Yes [provider]  QUEtiapine (SEROQUEL) 200 MG tablet Take 1 tablet (200 mg total) by mouth at bedtime. 07/05/18  Yes Oneta Rack, NP  QUEtiapine (SEROQUEL) 50 MG tablet Take 1 tablet (50 mg total) by  mouth 2 (two) times daily. 07/05/18  Yes Oneta RackLewis, Tanika N, NP  lisinopril (PRINIVIL,ZESTRIL) 10 MG tablet Take 1 tablet (10 mg total) by mouth daily. 07/06/18   Oneta RackLewis, Tanika N, NP  lithium carbonate (LITHOBID) 300 MG CR tablet Take 2 tablets (600 mg total) by mouth every 12 (twelve) hours. 07/05/18   Oneta RackLewis, Tanika N, NP    Allergies Aspirin; Cranberry; Cranberry extract; Hydrocodone-acetaminophen; Hydrocodone; Trazodone and nefazodone; and Vicodin [hydrocodone-acetaminophen]   REVIEW OF SYSTEMS     PHYSICAL EXAMINATION  Initial Vital  Signs Blood pressure (!) 88/49, pulse 77, temperature (!) 96.6 F (35.9 C), temperature source Core, resp. rate 19, height 5\' 6"  (1.676 m), SpO2 100 %.  Examination General: Well-developed, obese female in no acute distress; appearance consistent with age of record HENT: normocephalic; atraumatic; absent gag reflex Eyes: Pupils equal, round and sluggish; extraocular muscle function and cannot be assessed; no corneal reflexes Neck: supple Heart: regular rate and rhythm Lungs: clear to auscultation bilaterally Abdomen: soft; nondistended; nontender; bowel sounds present Extremities: No deformity; full range of motion; pulses weak Neurologic: Withdraws to painful stimuli otherwise unresponsive; noted to move all extremities when provoked  Skin: Warm and dry   RESULTS  Summary of this visit's results, reviewed by myself:   EKG Interpretation  Date/Time:  Monday July 14 2018 00:26:22 EDT Ventricular Rate:  80 PR Interval:    QRS Duration: 107 QT Interval:  391 QTC Calculation: 451 R Axis:   90 Text Interpretation:  Sinus rhythm Borderline right axis deviation Borderline T abnormalities, anterior leads Confirmed by Paula LibraMolpus, Cashlyn Huguley ([id number]) on 07/14/2018 1:23:57 AM      Laboratory Studies: Results for orders placed or performed during the hospital encounter of 07/14/18 (from the past 24 hour(s))  Comprehensive metabolic panel     Status: Abnormal   Collection Time: 07/14/18 12:21 AM  Result Value Ref Range   Sodium 144 135 - 145 mmol/L   Potassium 3.9 3.5 - 5.1 mmol/L   Chloride 114 (H) 98 - 111 mmol/L   CO2 22 22 - 32 mmol/L   Glucose, Bld 98 70 - 99 mg/dL   BUN 6 6 - 20 mg/dL   Creatinine, Ser 9.560.70 0.44 - 1.00 mg/dL   Calcium 8.1 (L) 8.9 - 10.3 mg/dL   Total Protein 6.8 6.5 - 8.1 g/dL   Albumin 3.0 (L) 3.5 - 5.0 g/dL   AST 17 15 - 41 U/L   ALT 22 0 - 44 U/L   Alkaline Phosphatase 67 38 - 126 U/L   Total Bilirubin 0.2 (L) 0.3 - 1.2 mg/dL   GFR calc non Af Amer >60 >60 mL/min     GFR calc Af Amer >60 >60 mL/min   Anion gap 8 5 - 15  Ethanol     Status: Abnormal   Collection Time: 07/14/18 12:21 AM  Result Value Ref Range   Alcohol, Ethyl (B) 40 (H) <10 mg/dL  Salicylate level     Status: None   Collection Time: 07/14/18 12:21 AM  Result Value Ref Range   Salicylate Lvl <7.0 2.8 - 30.0 mg/dL  Acetaminophen level     Status: Abnormal   Collection Time: 07/14/18 12:21 AM  Result Value Ref Range   Acetaminophen (Tylenol), Serum <10 (L) 10 - 30 ug/mL  cbc     Status: Abnormal   Collection Time: 07/14/18 12:21 AM  Result Value Ref Range   WBC 7.3 4.0 - 10.5 K/uL   RBC 4.03 3.87 - 5.11 MIL/uL  Hemoglobin 11.2 (L) 12.0 - 15.0 g/dL   HCT 16.1 (L) 09.6 - 04.5 %   MCV 85.9 78.0 - 100.0 fL   MCH 27.8 26.0 - 34.0 pg   MCHC 32.4 30.0 - 36.0 g/dL   RDW 40.9 81.1 - 91.4 %   Platelets 359 150 - 400 K/uL  CBC with Differential/Platelet     Status: Abnormal   Collection Time: 07/14/18 12:21 AM  Result Value Ref Range   WBC 7.4 4.0 - 10.5 K/uL   RBC 4.11 3.87 - 5.11 MIL/uL   Hemoglobin 11.2 (L) 12.0 - 15.0 g/dL   HCT 78.2 (L) 95.6 - 21.3 %   MCV 85.9 78.0 - 100.0 fL   MCH 27.3 26.0 - 34.0 pg   MCHC 31.7 30.0 - 36.0 g/dL   RDW 08.6 57.8 - 46.9 %   Platelets 383 150 - 400 K/uL   Neutrophils Relative % 60 %   Neutro Abs 4.5 1.7 - 7.7 K/uL   Lymphocytes Relative 30 %   Lymphs Abs 2.2 0.7 - 4.0 K/uL   Monocytes Relative 7 %   Monocytes Absolute 0.5 0.1 - 1.0 K/uL   Eosinophils Relative 2 %   Eosinophils Absolute 0.2 0.0 - 0.7 K/uL   Basophils Relative 1 %   Basophils Absolute 0.0 0.0 - 0.1 K/uL  CBG monitoring, ED     Status: None   Collection Time: 07/14/18 12:46 AM  Result Value Ref Range   Glucose-Capillary 91 70 - 99 mg/dL  Rapid urine drug screen (hospital performed)     Status: None   Collection Time: 07/14/18  1:02 AM  Result Value Ref Range   Opiates NONE DETECTED NONE DETECTED   Cocaine NONE DETECTED NONE DETECTED   Benzodiazepines NONE DETECTED  NONE DETECTED   Amphetamines NONE DETECTED NONE DETECTED   Tetrahydrocannabinol NONE DETECTED NONE DETECTED   Barbiturates NONE DETECTED NONE DETECTED  Lithium level     Status: None   Collection Time: 07/14/18  1:02 AM  Result Value Ref Range   Lithium Lvl 0.88 0.60 - 1.20 mmol/L  I-Stat beta hCG blood, ED     Status: None   Collection Time: 07/14/18  1:18 AM  Result Value Ref Range   I-stat hCG, quantitative <5.0 <5 mIU/mL   Comment 3          Blood gas, arterial     Status: Abnormal   Collection Time: 07/14/18  2:20 AM  Result Value Ref Range   FIO2 70.00    Delivery systems VENTILATOR    Mode PRESSURE REGULATED VOLUME CONTROL    VT 470 mL   LHR 20 resp/min   Peep/cpap 5.0 cm H20   pH, Arterial 7.315 (L) 7.350 - 7.450   pCO2 arterial 41.0 32.0 - 48.0 mmHg   pO2, Arterial 260 (H) 83.0 - 108.0 mmHg   Bicarbonate 20.3 20.0 - 28.0 mmol/L   Acid-base deficit 5.0 (H) 0.0 - 2.0 mmol/L   O2 Saturation 99.1 %   Patient temperature 36.9    Collection site RIGHT RADIAL    Drawn by [id number]    Sample type ARTERIAL    Allens test (pass/fail) PASS PASS   Imaging Studies: Dg Abdomen 1 View  Result Date: 07/14/2018 CLINICAL DATA:  NG tube placement EXAM: ABDOMEN - 1 VIEW COMPARISON:  None. FINDINGS: Enteric tube tip is in the right upper quadrant consistent with location in the distal stomach. IMPRESSION: Enteric tube tip is in the right upper quadrant consistent with  location in the distal stomach. Electronically Signed   By: Burman Nieves M.D.   On: 07/14/2018 02:17   Dg Chest Portable 1 View  Result Date: 07/14/2018 CLINICAL DATA:  Reposition endotracheal tube EXAM: PORTABLE CHEST 1 VIEW COMPARISON:  07/14/2018 FINDINGS: Endotracheal tube tip measures 3.8 cm above the carina, demonstrating improved position since previous study. Enteric tube tip is off the field of view but below the left hemidiaphragm. Shallow inspiration. Cardiac enlargement. No focal airspace disease. No blunting  of costophrenic angles. IMPRESSION: Endotracheal tube has been repositioned with tip now measuring 3.8 cm above the carina. Electronically Signed   By: Burman Nieves M.D.   On: 07/14/2018 02:21   Dg Chest Portable 1 View  Result Date: 07/14/2018 CLINICAL DATA:  Intubated EXAM: PORTABLE CHEST 1 VIEW COMPARISON:  None. FINDINGS: Low lung volumes. Tip of the endotracheal tube overlies the proximal right mainstem bronchus. Esophageal tube tip is below the diaphragm but non included. Cardiomegaly with mild central vascular congestion. No pleural effusion or pneumothorax IMPRESSION: 1. Tip of the endotracheal tube projects over the proximal right mainstem bronchus 2. Cardiomegaly with central vascular congestion Critical Value/emergent results were called by telephone at the time of interpretation on 07/14/2018 at 1:35 am to Dr. Paula Libra , who verbally acknowledged these results. Electronically Signed   By: Jasmine Pang M.D.   On: 07/14/2018 01:35    ED COURSE and MDM  Nursing notes and initial vitals signs, including pulse oximetry, reviewed.  Vitals:   07/14/18 0200 07/14/18 0223 07/14/18 0230 07/14/18 0236  BP: (!) 101/59 114/80 (!) 103/55   Pulse: 72 69 72   Resp: (!) 24 (!) 23 (!) 24   Temp: 98.6 F (37 C) 98.6 F (37 C) 98.6 F (37 C)   TempSrc:  Core    SpO2: 100% 100% 100% 100%  Height:       Patient intubated for significantly decreased level of consciousness.  See note below.  1:24 AM Second liter normal saline initiated for persistent hypotension, systolic BP in the 80s.  Patient has not required post intubation sedation.  Lithium level is pending as a toxic level would be a medical emergency.  2:40 AM Systolic blood pressure up to 161.  Patient still somnolent without need for sedation.  Blood gas reassuring.  Lithium level within therapeutic limits.   3:00 AM Dr. Katrinka Blazing of Pulmonary Critical Care consulted.  They will admit to the ICU.  3:33 AM Patient still borderline  hypotensive.  Third liter of normal saline ordered.  Patient somnolence is consistent with suspected overdose of Seroquel.  Hypotension could be attributed to Seroquel as well.  PROCEDURES   INTUBATION Performed by: Brannan Cassedy L  Required items: required blood products, implants, devices, and special equipment available Patient identity confirmed: provided demographic data and hospital-assigned identification number Time out: Immediately prior to procedure a "time out" was called to verify the correct patient, procedure, equipment, support staff and site/side marked as required.  Indications: Decreased level of consciousness status post drug overdose  Intubation method: Glidescope Laryngoscopy   Preoxygenation: BVM  Sedatives: Etomidate 20 mg Paralytic: Succinylcholine 150 mg  Tube Size: 7.5 cuffed  Post-procedure assessment: chest rise and ETCO2 monitor Breath sounds: equal and absent over the epigastrium Tube secured with: ETT holder Chest x-ray interpreted by radiologist and me.  Chest x-ray findings: endotracheal tube in proximal right mainstem bronchus.  Tube placement adjusted and follow-up x-ray obtained.  Patient tolerated the procedure well with no immediate complications.  CRITICAL CARE Performed by: Paula Libra L Total critical care time: 45 minutes Critical care time was exclusive of separately billable procedures and treating other patients. Critical care was necessary to treat or prevent imminent or life-threatening deterioration. Critical care was time spent personally by me on the following activities: development of treatment plan with patient and/or surrogate as well as nursing, discussions with consultants, evaluation of patient's response to treatment, examination of patient, obtaining history from patient or surrogate, ordering and performing treatments and interventions, ordering and review of laboratory studies, ordering and review of radiographic  studies, pulse oximetry and re-evaluation of patient's condition.   ED DIAGNOSES     ICD-10-CM   1. Intentional drug overdose, initial encounter (HCC) T50.902A   2. Dependent on ventilator (HCC) Z99.11   3. Suicide attempt [facility]) T14.91XA        Paula Libra, MD 07/14/18 [phone]

## 2018-07-14 NOTE — Progress Notes (Signed)
[facility] notified of pt's increasing lithium levels. [facility] Waiting on orders and bed placement. Will call [facility] & RN to give report when need be.

## 2018-07-14 NOTE — ED Notes (Signed)
ED Provider at bedside on arrival to treatment room. Pt has weak gag reflex.

## 2018-07-14 NOTE — ED Notes (Signed)
ED TO INPATIENT HANDOFF REPORT  Name/Age/Gender Veronica Waters 27 y.o. female  Code Status    Code Status Orders  (From admission, onward)        Start     Ordered   07/14/18 0243  Full code  Continuous     07/14/18 0243    Code Status History    Date Active Date Inactive Code Status Order ID Comments User Context   06/28/2018 2359 07/05/2018 1441 Full Code [id number]  Rennie Plowman Inpatient   01/18/2018 2058 01/19/2018 1652 Full Code [id number]  Florencia Reasons, MD Inpatient   01/05/2018 2245 01/14/2018 1648 Full Code [id number]  Rozetta Nunnery, NP Inpatient   09/01/2017 1541 09/06/2017 1036 Full Code [id number]  Vicenta Aly, NP Inpatient   09/01/2017 0721 09/01/2017 1335 Full Code [id number]  Ezequiel Essex, MD ED      Home/SNF/Other Home  Chief Complaint Ingestion  Level of Care/Admitting Diagnosis ED Disposition    ED Disposition Condition [place]: [facility] [[id number]]  Level of Care: ICU [6]  Diagnosis: Overdose [950722]  Admitting Physician: Kandice Hams [[id number]]  Attending Physician: Kandice Hams [[id number]]  Estimated length of stay: 3 - 4 days  Certification:: I certify this patient will need inpatient services for at least 2 midnights  PT Class (Do Not Modify): Inpatient [101]  PT Acc Code (Do Not Modify): Private [1]       Medical History Past Medical History:  Diagnosis Date  . Asthma   . Hypertension   . Major depressive disorder     Allergies Allergies  Allergen Reactions  . Aspirin Hives  . Cranberry Hives and Swelling  . Cranberry Extract Anaphylaxis  . Hydrocodone-Acetaminophen Hives  . Hydrocodone Hives  . Trazodone And Nefazodone Other (See Comments)    hallucinations   . Vicodin [Hydrocodone-Acetaminophen] Hives    IV Location/Drains/Wounds Patient Lines/Drains/Airways Status   Active Line/Drains/Airways    Name:   Placement date:   Placement time:   Site:   Days:   Peripheral IV 07/14/18 Left Forearm   07/14/18    0025    Forearm   less than 1   Peripheral IV 07/14/18 Right Forearm   07/14/18    0132    Forearm   less than 1   Urethral Catheter Benita Stabile EMT Temperature probe 16 Fr.   07/14/18    0102    Temperature probe   less than 1   Airway 7.5 mm   07/14/18    0040     less than 1          Labs/Imaging Results for orders placed or performed during the hospital encounter of 07/14/18 (from the past 48 hour(s))  Comprehensive metabolic panel     Status: Abnormal   Collection Time: 07/14/18 12:21 AM  Result Value Ref Range   Sodium 144 135 - 145 mmol/L   Potassium 3.9 3.5 - 5.1 mmol/L   Chloride 114 (H) 98 - 111 mmol/L    Comment: Please note change in reference range.   CO2 22 22 - 32 mmol/L   Glucose, Bld 98 70 - 99 mg/dL    Comment: Please note change in reference range.   BUN 6 6 - 20 mg/dL    Comment: Please note change in reference range.   Creatinine, Ser 0.70 0.44 - 1.00 mg/dL   Calcium 8.1 (L) 8.9 - 10.3 mg/dL   Total Protein 6.8 6.5 -  8.1 g/dL   Albumin 3.0 (L) 3.5 - 5.0 g/dL   AST 17 15 - 41 U/L   ALT 22 0 - 44 U/L    Comment: Please note change in reference range.   Alkaline Phosphatase 67 38 - 126 U/L   Total Bilirubin 0.2 (L) 0.3 - 1.2 mg/dL   GFR calc non Af Amer >60 >60 mL/min   GFR calc Af Amer >60 >60 mL/min    Comment: (NOTE) The eGFR has been calculated using the CKD EPI equation. This calculation has not been validated in all clinical situations. eGFR's persistently <60 mL/min signify possible Chronic Kidney Disease.    Anion gap 8 5 - 15    Comment: Performed at [facility], [place] [address] [place], [place] [address]  Ethanol     Status: Abnormal   Collection Time: 07/14/18 12:21 AM  Result Value Ref Range   Alcohol, Ethyl (B) 40 (H) <10 mg/dL    Comment: (NOTE) Lowest detectable limit for serum alcohol is 10 mg/dL. For medical purposes only. Performed at [facility], [place] [address], [place], [place] [address]   Salicylate level     Status: None   Collection Time: 07/14/18 12:21 AM  Result Value Ref Range   Salicylate Lvl <0.9 2.8 - 30.0 mg/dL    Comment: Performed at [facility], [place] [address] [place], [place] [address]  Acetaminophen level     Status: Abnormal   Collection Time: 07/14/18 12:21 AM  Result Value Ref Range   Acetaminophen (Tylenol), Serum <10 (L) 10 - 30 ug/mL    Comment: (NOTE) Therapeutic concentrations vary significantly. A range of 10-30 ug/mL  may be an effective concentration for many patients. However, some  are best treated at concentrations outside of this range. Acetaminophen concentrations >150 ug/mL at 4 hours after ingestion  and >50 ug/mL at 12 hours after ingestion are often associated with  toxic reactions. Performed at [facility], [place] [address], [place], [place] [address]   cbc     Status: Abnormal   Collection Time: 07/14/18 12:21 AM  Result Value Ref Range   WBC 7.3 4.0 - 10.5 K/uL   RBC 4.03 3.87 - 5.11 MIL/uL   Hemoglobin 11.2 (L) 12.0 - 15.0 g/dL   HCT 34.6 (L) 36.0 - 46.0 %   MCV 85.9 78.0 - 100.0 fL   MCH 27.8 26.0 - 34.0 pg   MCHC 32.4 30.0 - 36.0 g/dL   RDW 13.9 11.5 - 15.5 %   Platelets 359 150 - 400 K/uL    Comment: Performed at [facility], [place] [address] [place], [place] [address]  CBC with Differential/Platelet     Status: Abnormal   Collection Time: 07/14/18 12:21 AM  Result Value Ref Range   WBC 7.4 4.0 - 10.5 K/uL   RBC 4.11 3.87 - 5.11 MIL/uL   Hemoglobin 11.2 (L) 12.0 - 15.0 g/dL   HCT 35.3 (L) 36.0 - 46.0 %   MCV 85.9 78.0 - 100.0 fL   MCH 27.3 26.0 - 34.0 pg   MCHC 31.7 30.0 - 36.0 g/dL   RDW 14.0 11.5 - 15.5 %   Platelets 383 150 - 400 K/uL   Neutrophils Relative % 60 %   Neutro Abs 4.5 1.7 - 7.7 K/uL   Lymphocytes Relative 30 %   Lymphs Abs 2.2 0.7 - 4.0 K/uL   Monocytes Relative 7 %   Monocytes  Absolute 0.5  0.1 - 1.0 K/uL   Eosinophils Relative 2 %   Eosinophils Absolute 0.2 0.0 - 0.7 K/uL   Basophils Relative 1 %   Basophils Absolute 0.0 0.0 - 0.1 K/uL    Comment: Performed at [facility], [place] [address] [place], [place] [address]  CBG monitoring, ED     Status: None   Collection Time: 07/14/18 12:46 AM  Result Value Ref Range   Glucose-Capillary 91 70 - 99 mg/dL  Rapid urine drug screen (hospital performed)     Status: None   Collection Time: 07/14/18  1:02 AM  Result Value Ref Range   Opiates NONE DETECTED NONE DETECTED   Cocaine NONE DETECTED NONE DETECTED   Benzodiazepines NONE DETECTED NONE DETECTED   Amphetamines NONE DETECTED NONE DETECTED   Tetrahydrocannabinol NONE DETECTED NONE DETECTED   Barbiturates NONE DETECTED NONE DETECTED    Comment: (NOTE) DRUG SCREEN FOR MEDICAL PURPOSES ONLY.  IF CONFIRMATION IS NEEDED FOR ANY PURPOSE, NOTIFY LAB WITHIN 5 DAYS. LOWEST DETECTABLE LIMITS FOR URINE DRUG SCREEN Drug Class                     Cutoff (ng/mL) Amphetamine and metabolites    1000 Barbiturate and metabolites    200 Benzodiazepine                 846 Tricyclics and metabolites     300 Opiates and metabolites        300 Cocaine and metabolites        300 THC                            50 Performed at [facility], [place] [address] [place], [place] [address]   Lithium level     Status: None   Collection Time: 07/14/18  1:02 AM  Result Value Ref Range   Lithium Lvl 0.88 0.60 - 1.20 mmol/L    Comment: Performed at [facility], [place] [address], [place], [place] [address]  I-Stat beta hCG blood, ED     Status: None   Collection Time: 07/14/18  1:18 AM  Result Value Ref Range   I-stat hCG, quantitative <5.0 <5 mIU/mL   Comment 3            Comment:   GEST. AGE      CONC.  (mIU/mL)   <=1 WEEK        5 - 50     2 WEEKS       50 - 500     3 WEEKS       100 - 10,000     4 WEEKS     1,000 - 30,000         FEMALE AND NON-PREGNANT FEMALE:     LESS THAN 5 mIU/mL   Blood gas, arterial     Status: Abnormal   Collection Time: 07/14/18  2:20 AM  Result Value Ref Range   FIO2 70.00    Delivery systems VENTILATOR    Mode PRESSURE REGULATED VOLUME CONTROL    VT 470 mL   LHR 20 resp/min   Peep/cpap 5.0 cm H20   pH, Arterial 7.315 (L) 7.350 - 7.450   pCO2 arterial 41.0 32.0 - 48.0 mmHg   pO2, Arterial 260 (H) 83.0 - 108.0 mmHg   Bicarbonate 20.3 20.0 - 28.0 mmol/L   Acid-base deficit 5.0 (H) 0.0 - 2.0 mmol/L  O2 Saturation 99.1 %   Patient temperature 36.9    Collection site RIGHT RADIAL    Drawn by [id number]    Sample type ARTERIAL    Allens test (pass/fail) PASS PASS    Comment: Performed at [facility], [place] [address], [place], [place] [address]   Dg Abdomen 1 View  Result Date: 07/14/2018 CLINICAL DATA:  NG tube placement EXAM: ABDOMEN - 1 VIEW COMPARISON:  None. FINDINGS: Enteric tube tip is in the right upper quadrant consistent with location in the distal stomach. IMPRESSION: Enteric tube tip is in the right upper quadrant consistent with location in the distal stomach. Electronically Signed   By: Lucienne Capers M.D.   On: 07/14/2018 02:17   Dg Chest Portable 1 View  Result Date: 07/14/2018 CLINICAL DATA:  Reposition endotracheal tube EXAM: PORTABLE CHEST 1 VIEW COMPARISON:  07/14/2018 FINDINGS: Endotracheal tube tip measures 3.8 cm above the carina, demonstrating improved position since previous study. Enteric tube tip is off the field of view but below the left hemidiaphragm. Shallow inspiration. Cardiac enlargement. No focal airspace disease. No blunting of costophrenic angles. IMPRESSION: Endotracheal tube has been repositioned with tip now measuring 3.8 cm above the carina. Electronically Signed   By: Lucienne Capers M.D.   On: 07/14/2018 02:21   Dg Chest Portable 1 View  Result Date: 07/14/2018 CLINICAL DATA:  Intubated EXAM: PORTABLE CHEST 1 VIEW  COMPARISON:  None. FINDINGS: Low lung volumes. Tip of the endotracheal tube overlies the proximal right mainstem bronchus. Esophageal tube tip is below the diaphragm but non included. Cardiomegaly with mild central vascular congestion. No pleural effusion or pneumothorax IMPRESSION: 1. Tip of the endotracheal tube projects over the proximal right mainstem bronchus 2. Cardiomegaly with central vascular congestion Critical Value/emergent results were called by telephone at the time of interpretation on 07/14/2018 at 1:35 am to Dr. Shanon Rosser , who verbally acknowledged these results. Electronically Signed   By: Donavan Foil M.D.   On: 07/14/2018 01:35    Pending Labs Unresulted Labs (From admission, onward)   Start     Ordered   07/14/18 0246  Magnesium  Once,   R     07/14/18 0257   07/14/18 0246  Phosphorus  Once,   R     07/14/18 0257      Vitals/Pain Today's Vitals   07/14/18 0200 07/14/18 0223 07/14/18 0230 07/14/18 0236  BP: (!) 101/59 114/80 (!) 103/55   Pulse: 72 69 72   Resp: (!) 24 (!) 23 (!) 24   Temp: 98.6 F (37 C) 98.6 F (37 C) 98.6 F (37 C)   TempSrc:  Core    SpO2: 100% 100% 100% 100%  Height:        Isolation Precautions No active isolations  Medications Medications  0.9 %  sodium chloride infusion (has no administration in time range)  heparin injection 5,000 Units (has no administration in time range)  pantoprazole sodium (PROTONIX) 40 mg/20 mL oral suspension 40 mg (has no administration in time range)  folic acid injection 1 mg (has no administration in time range)  thiamine (B-1) injection 100 mg (has no administration in time range)  insulin aspart (novoLOG) injection 0-15 Units (has no administration in time range)  0.9 %  sodium chloride infusion (has no administration in time range)  fentaNYL (SUBLIMAZE) injection 100 mcg (has no administration in time range)  fentaNYL (SUBLIMAZE) injection 100 mcg (has no administration in time range)  sodium  chloride 0.9 %  bolus 1,000 mL (has no administration in time range)  etomidate (AMIDATE) injection 20 mg (20 mg Intravenous Given 07/14/18 0039)  succinylcholine (ANECTINE) injection 150 mg (150 mg Intravenous Given 07/14/18 0039)    Mobility

## 2018-07-14 NOTE — Progress Notes (Signed)
Pt transported from ED to ICU 1222 on vent 100% fio2.  Pt tolerated transport well without incident.

## 2018-07-14 NOTE — Consult Note (Signed)
Reason for Consult:Veronica Waters toxicity Referring Physician: Dr. Constance Haw Thurgood is an 27 y.o. female.  HPI: 27 yr female with hx severe depression, multiple suicide attempts, obesity , DM, HTN, presented comatose after OD yest pm.  OD on Seroquel, propranolol, naltrexone, fluoxitine, lininopril and Veronica Waters.  Amounts unknown.  Veronica Waters level has risen 1.8 to 2.7.  Urine output marginal despite large vol ivf.  S Cr .7. And normal LFTs.  Was entub initially, now extub and conversant.  Hx UTIs anss 2 renal stones last year. No FH renal dz Constitutional: depression, no pain Eyes: negative Ears, nose, mouth, throat, and face: negative Respiratory: negative Cardiovascular: negative Gastrointestinal: negative Genitourinary:negative Integument/breast: negative Musculoskeletal:negative Neurological: negative Behavioral/Psych: as above Endocrine: DM Allergic/Immunologic: ASA,Cranberry, trazadone, Hydrocodone  .  Past Medical History:  Diagnosis Date  . Asthma   . Hypertension   . Major depressive disorder     History reviewed. No pertinent surgical history.  Family History  Family history unknown: Yes    Social History:  reports that she has never smoked. She has never used smokeless tobacco. She reports that she does not drink alcohol or use drugs.  Allergies:  Allergies  Allergen Reactions  . Aspirin Hives  . Cranberry Hives and Swelling  . Cranberry Extract Anaphylaxis  . Hydrocodone-Acetaminophen Hives  . Hydrocodone Hives  . Trazodone And Nefazodone Other (See Comments)    hallucinations   . Vicodin [Hydrocodone-Acetaminophen] Hives    Medications:  I have reviewed the patient's current medications. Prior to Admission:  Medications Prior to Admission  Medication Sig Dispense Refill Last Dose  . albuterol (PROVENTIL HFA;VENTOLIN HFA) 108 (90 Base) MCG/ACT inhaler Inhale 1-2 puffs into the lungs every 6 (six) hours as needed for wheezing or shortness of breath. 1 Inhaler 0 unknown   . clonazePAM (KLONOPIN) 0.5 MG tablet Take 0.5 mg by mouth 3 (three) times daily as needed for anxiety.   unknown  . FLUoxetine (PROZAC) 10 MG capsule Take 1 capsule (10 mg total) by mouth daily. 30 capsule 0 unknown  . gabapentin (NEURONTIN) 300 MG capsule Take 600 mg by mouth 3 (three) times daily.   unknown  . gabapentin (NEURONTIN) 400 MG capsule Take 1 capsule (400 mg total) by mouth 3 (three) times daily. 90 capsule 0 unknown  . levothyroxine (SYNTHROID, LEVOTHROID) 50 MCG tablet Take 1 tablet (50 mcg total) by mouth daily before breakfast. 30 tablet 0 unknown  . Veronica Waters carbonate (ESKALITH) 450 MG CR tablet Take 450 mg by mouth every 12 (twelve) hours.   unknown  . Melatonin 3 MG TABS Take 3 mg by mouth daily as needed.   unknown  . metFORMIN (GLUCOPHAGE-XR) 500 MG 24 hr tablet Take 500 mg by mouth daily with breakfast.   unknown  . naltrexone (DEPADE) 50 MG tablet Take 25 mg by mouth daily.   unknow  . propranolol (INDERAL) 20 MG tablet Take 1 tablet (20 mg total) by mouth 2 (two) times daily. 60 tablet 0 unknown  . Pseudoeph-Doxylamine-DM-APAP (NYQUIL D COLD/FLU PO) Take 30 mLs by mouth every 6 (six) hours.   unknown  . QUEtiapine (SEROQUEL) 200 MG tablet Take 1 tablet (200 mg total) by mouth at bedtime. 30 tablet 0 unknown  . QUEtiapine (SEROQUEL) 50 MG tablet Take 1 tablet (50 mg total) by mouth 2 (two) times daily. 60 tablet 0 unknown  . lisinopril (PRINIVIL,ZESTRIL) 10 MG tablet Take 1 tablet (10 mg total) by mouth daily. 30 tablet 0 unknown  . Veronica Waters carbonate (LITHOBID)  300 MG CR tablet Take 2 tablets (600 mg total) by mouth every 12 (twelve) hours. 120 tablet 0 unknown    Results for orders placed or performed during the hospital encounter of 07/14/18 (from the past 48 hour(s))  Comprehensive metabolic panel     Status: Abnormal   Collection Time: 07/14/18 12:21 AM  Result Value Ref Range   Sodium 144 135 - 145 mmol/L   Potassium 3.9 3.5 - 5.1 mmol/L   Chloride 114 (H) 98 -  111 mmol/L    Comment: Please note change in reference range.   CO2 22 22 - 32 mmol/L   Glucose, Bld 98 70 - 99 mg/dL    Comment: Please note change in reference range.   BUN 6 6 - 20 mg/dL    Comment: Please note change in reference range.   Creatinine, Ser 0.70 0.44 - 1.00 mg/dL   Calcium 8.1 (L) 8.9 - 10.3 mg/dL   Total Protein 6.8 6.5 - 8.1 g/dL   Albumin 3.0 (L) 3.5 - 5.0 g/dL   AST 17 15 - 41 U/L   ALT 22 0 - 44 U/L    Comment: Please note change in reference range.   Alkaline Phosphatase 67 38 - 126 U/L   Total Bilirubin 0.2 (L) 0.3 - 1.2 mg/dL   GFR calc non Af Amer >60 >60 mL/min   GFR calc Af Amer >60 >60 mL/min    Comment: (NOTE) The eGFR has been calculated using the CKD EPI equation. This calculation has not been validated in all clinical situations. eGFR's persistently <60 mL/min signify possible Chronic Kidney Disease.    Anion gap 8 5 - 15    Comment: Performed at [facility], [place] [address] [place], [place] [address]  Ethanol     Status: Abnormal   Collection Time: 07/14/18 12:21 AM  Result Value Ref Range   Alcohol, Ethyl (B) 40 (H) <10 mg/dL    Comment: (NOTE) Lowest detectable limit for serum alcohol is 10 mg/dL. For medical purposes only. Performed at [facility], [place] [address], [place], [place] [address]   Salicylate level     Status: None   Collection Time: 07/14/18 12:21 AM  Result Value Ref Range   Salicylate Lvl <9.6 2.8 - 30.0 mg/dL    Comment: Performed at [facility], [place] [address] [place], [place] [address]  Acetaminophen level     Status: Abnormal   Collection Time: 07/14/18 12:21 AM  Result Value Ref Range   Acetaminophen (Tylenol), Serum <10 (L) 10 - 30 ug/mL    Comment: (NOTE) Therapeutic concentrations vary significantly. A range of 10-30 ug/mL  may be an effective concentration for many patients. However, some  are best treated at concentrations outside of this  range. Acetaminophen concentrations >150 ug/mL at 4 hours after ingestion  and >50 ug/mL at 12 hours after ingestion are often associated with  toxic reactions. Performed at [facility], [place] [address], [place], [place] [address]   cbc     Status: Abnormal   Collection Time: 07/14/18 12:21 AM  Result Value Ref Range   WBC 7.3 4.0 - 10.5 K/uL   RBC 4.03 3.87 - 5.11 MIL/uL   Hemoglobin 11.2 (L) 12.0 - 15.0 g/dL   HCT 34.6 (L) 36.0 - 46.0 %   MCV 85.9 78.0 - 100.0 fL   MCH 27.8 26.0 - 34.0 pg   MCHC 32.4 30.0 - 36.0 g/dL   RDW 13.9  11.5 - 15.5 %   Platelets 359 150 - 400 K/uL    Comment: Performed at [facility], [place] [address], [place], [place] [address]  CBC with Differential/Platelet     Status: Abnormal   Collection Time: 07/14/18 12:21 AM  Result Value Ref Range   WBC 7.4 4.0 - 10.5 K/uL   RBC 4.11 3.87 - 5.11 MIL/uL   Hemoglobin 11.2 (L) 12.0 - 15.0 g/dL   HCT 35.3 (L) 36.0 - 46.0 %   MCV 85.9 78.0 - 100.0 fL   MCH 27.3 26.0 - 34.0 pg   MCHC 31.7 30.0 - 36.0 g/dL   RDW 14.0 11.5 - 15.5 %   Platelets 383 150 - 400 K/uL   Neutrophils Relative % 60 %   Neutro Abs 4.5 1.7 - 7.7 K/uL   Lymphocytes Relative 30 %   Lymphs Abs 2.2 0.7 - 4.0 K/uL   Monocytes Relative 7 %   Monocytes Absolute 0.5 0.1 - 1.0 K/uL   Eosinophils Relative 2 %   Eosinophils Absolute 0.2 0.0 - 0.7 K/uL   Basophils Relative 1 %   Basophils Absolute 0.0 0.0 - 0.1 K/uL    Comment: Performed at [facility], [place] [address] [place], [place] [address]  CBG monitoring, ED     Status: None   Collection Time: 07/14/18 12:46 AM  Result Value Ref Range   Glucose-Capillary 91 70 - 99 mg/dL  Rapid urine drug screen (hospital performed)     Status: None   Collection Time: 07/14/18  1:02 AM  Result Value Ref Range   Opiates NONE DETECTED NONE DETECTED   Cocaine NONE DETECTED NONE DETECTED   Benzodiazepines NONE DETECTED NONE DETECTED   Amphetamines  NONE DETECTED NONE DETECTED   Tetrahydrocannabinol NONE DETECTED NONE DETECTED   Barbiturates NONE DETECTED NONE DETECTED    Comment: (NOTE) DRUG SCREEN FOR MEDICAL PURPOSES ONLY.  IF CONFIRMATION IS NEEDED FOR ANY PURPOSE, NOTIFY LAB WITHIN 5 DAYS. LOWEST DETECTABLE LIMITS FOR URINE DRUG SCREEN Drug Class                     Cutoff (ng/mL) Amphetamine and metabolites    1000 Barbiturate and metabolites    200 Benzodiazepine                 277 Tricyclics and metabolites     300 Opiates and metabolites        300 Cocaine and metabolites        300 THC                            50 Performed at [facility], [place] [address] [place], [place] [address]   Veronica Waters level     Status: None   Collection Time: 07/14/18  1:02 AM  Result Value Ref Range   Veronica Waters Lvl 0.88 0.60 - 1.20 mmol/L    Comment: Performed at [facility], [place] [address], [place], [place] [address]  I-Stat beta hCG blood, ED     Status: None   Collection Time: 07/14/18  1:18 AM  Result Value Ref Range   I-stat hCG, quantitative <5.0 <5 mIU/mL   Comment 3            Comment:   GEST. AGE      CONC.  (mIU/mL)   <=1 WEEK        5 - 50  2 WEEKS       50 - 500     3 WEEKS       100 - 10,000     4 WEEKS     1,000 - 30,000        FEMALE AND NON-PREGNANT FEMALE:     LESS THAN 5 mIU/mL   Blood gas, arterial     Status: Abnormal   Collection Time: 07/14/18  2:20 AM  Result Value Ref Range   FIO2 70.00    Delivery systems VENTILATOR    Mode PRESSURE REGULATED VOLUME CONTROL    VT 470 mL   LHR 20 resp/min   Peep/cpap 5.0 cm H20   pH, Arterial 7.315 (L) 7.350 - 7.450   pCO2 arterial 41.0 32.0 - 48.0 mmHg   pO2, Arterial 260 (H) 83.0 - 108.0 mmHg   Bicarbonate 20.3 20.0 - 28.0 mmol/L   Acid-base deficit 5.0 (H) 0.0 - 2.0 mmol/L   O2 Saturation 99.1 %   Patient temperature 36.9    Collection site RIGHT RADIAL    Drawn by [id number]    Sample type ARTERIAL    Allens test  (pass/fail) PASS PASS    Comment: Performed at [facility], [place] [address], [place], [place] [address]  MRSA PCR Screening     Status: None   Collection Time: 07/14/18  3:30 AM  Result Value Ref Range   MRSA by PCR NEGATIVE NEGATIVE    Comment:        The GeneXpert MRSA Assay (FDA approved for NASAL specimens only), is one component of a comprehensive MRSA colonization surveillance program. It is not intended to diagnose MRSA infection nor to guide or monitor treatment for MRSA infections. Performed at [facility], [place] [address] [place], [place] [address]   Glucose, capillary     Status: None   Collection Time: 07/14/18  4:44 AM  Result Value Ref Range   Glucose-Capillary 77 70 - 99 mg/dL  Magnesium     Status: None   Collection Time: 07/14/18  5:22 AM  Result Value Ref Range   Magnesium 2.2 1.7 - 2.4 mg/dL    Comment: Performed at [facility], [place] [address] [place], [place] [address]  Phosphorus     Status: None   Collection Time: 07/14/18  5:22 AM  Result Value Ref Range   Phosphorus 3.7 2.5 - 4.6 mg/dL    Comment: Performed at [facility], [place] [address], [place], [place] [address]  Veronica Waters level     Status: Abnormal   Collection Time: 07/14/18  5:22 AM  Result Value Ref Range   Veronica Waters Lvl 1.27 (H) 0.60 - 1.20 mmol/L    Comment: Performed at [facility], [place] [address], [place], [place] [address]  Glucose, capillary     Status: Abnormal   Collection Time: 07/14/18  7:49 AM  Result Value Ref Range   Glucose-Capillary 125 (H) 70 - 99 mg/dL  Acetaminophen level     Status: Abnormal   Collection Time: 07/14/18 11:01 AM  Result Value Ref Range   Acetaminophen (Tylenol), Serum <10 (L) 10 - 30 ug/mL    Comment: (NOTE) Therapeutic concentrations vary significantly. A range of 10-30 ug/mL  may be an effective concentration for many patients. However, some  are best  treated at concentrations outside of this range. Acetaminophen concentrations >150 ug/mL at 4 hours after ingestion  and >50 ug/mL at 12 hours after ingestion are  often associated with  toxic reactions. Performed at [facility], [place] [address] [place], [place] [address]   TSH     Status: None   Collection Time: 07/14/18 11:01 AM  Result Value Ref Range   TSH 1.730 0.350 - 4.500 uIU/mL    Comment: Performed by a 3rd Generation assay with a functional sensitivity of <=0.01 uIU/mL. Performed at [facility], [place] [address], [place], [place] [address]   Comprehensive metabolic panel     Status: Abnormal   Collection Time: 07/14/18 11:01 AM  Result Value Ref Range   Sodium 143 135 - 145 mmol/L   Potassium 4.0 3.5 - 5.1 mmol/L   Chloride 114 (H) 98 - 111 mmol/L   CO2 25 22 - 32 mmol/L   Glucose, Bld 109 (H) 70 - 99 mg/dL   BUN 6 6 - 20 mg/dL   Creatinine, Ser 0.68 0.44 - 1.00 mg/dL   Calcium 8.4 (L) 8.9 - 10.3 mg/dL   Total Protein 7.1 6.5 - 8.1 g/dL   Albumin 3.1 (L) 3.5 - 5.0 g/dL   AST 27 15 - 41 U/L   ALT 34 0 - 44 U/L   Alkaline Phosphatase 67 38 - 126 U/L   Total Bilirubin 0.4 0.3 - 1.2 mg/dL   GFR calc non Af Amer >60 >60 mL/min   GFR calc Af Amer >60 >60 mL/min    Comment: (NOTE) The eGFR has been calculated using the CKD EPI equation. This calculation has not been validated in all clinical situations. eGFR's persistently <60 mL/min signify possible Chronic Kidney Disease.    Anion gap 4 (L) 5 - 15    Comment: Performed at [facility], [place] [address] [place], [place] [address]  Glucose, capillary     Status: None   Collection Time: 07/14/18 11:17 AM  Result Value Ref Range   Glucose-Capillary 99 70 - 99 mg/dL  Veronica Waters level     Status: Abnormal   Collection Time: 07/14/18  1:47 PM  Result Value Ref Range   Veronica Waters Lvl 1.83 (HH) 0.60 - 1.20 mmol/L    Comment: CRITICAL RESULT CALLED TO, READ BACK BY AND  VERIFIED WITH: T.OLEARY AT 1441 ON 07/14/18 BY N.THOMPSON Performed at [facility], [place] [address], [place], [place] [address]   Glucose, capillary     Status: None   Collection Time: 07/14/18  5:15 PM  Result Value Ref Range   Glucose-Capillary 99 70 - 99 mg/dL  Basic metabolic panel     Status: Abnormal   Collection Time: 07/14/18  6:10 PM  Result Value Ref Range   Sodium 144 135 - 145 mmol/L   Potassium 3.9 3.5 - 5.1 mmol/L   Chloride 115 (H) 98 - 111 mmol/L   CO2 25 22 - 32 mmol/L   Glucose, Bld 91 70 - 99 mg/dL   BUN 6 6 - 20 mg/dL   Creatinine, Ser 0.71 0.44 - 1.00 mg/dL   Calcium 8.6 (L) 8.9 - 10.3 mg/dL   GFR calc non Af Amer >60 >60 mL/min   GFR calc Af Amer >60 >60 mL/min    Comment: (NOTE) The eGFR has been calculated using the CKD EPI equation. This calculation has not been validated in all clinical situations. eGFR's persistently <60 mL/min signify possible Chronic Kidney Disease.    Anion gap 4 (L) 5 - 15    Comment: Performed at [facility], [place] [address], [place], [place] [address]  Veronica Waters level     Status: Abnormal   Collection Time: 07/14/18  6:40 PM  Result Value Ref Range   Veronica Waters Lvl 2.70 (HH) 0.60 - 1.20 mmol/L    Comment: CRITICAL RESULT CALLED TO, READ BACK BY AND VERIFIED WITH: A.BRASWELL AT 1944 ON 07/14/18 BY N.THOMPSON Performed at [facility], [place] [address] [place], [place] [address]   Glucose, capillary     Status: None   Collection Time: 07/14/18  8:01 PM  Result Value Ref Range   Glucose-Capillary 83 70 - 99 mg/dL   Comment 1 Notify RN     Dg Abdomen 1 View  Result Date: 07/14/2018 CLINICAL DATA:  NG tube placement EXAM: ABDOMEN - 1 VIEW COMPARISON:  None. FINDINGS: Enteric tube tip is in the right upper quadrant consistent with location in the distal stomach. IMPRESSION: Enteric tube tip is in the right upper quadrant consistent with location in the distal stomach.  Electronically Signed   By: Lucienne Capers M.D.   On: 07/14/2018 02:17   Dg Chest Portable 1 View  Result Date: 07/14/2018 CLINICAL DATA:  Reposition endotracheal tube EXAM: PORTABLE CHEST 1 VIEW COMPARISON:  07/14/2018 FINDINGS: Endotracheal tube tip measures 3.8 cm above the carina, demonstrating improved position since previous study. Enteric tube tip is off the field of view but below the left hemidiaphragm. Shallow inspiration. Cardiac enlargement. No focal airspace disease. No blunting of costophrenic angles. IMPRESSION: Endotracheal tube has been repositioned with tip now measuring 3.8 cm above the carina. Electronically Signed   By: Lucienne Capers M.D.   On: 07/14/2018 02:21   Dg Chest Portable 1 View  Result Date: 07/14/2018 CLINICAL DATA:  Intubated EXAM: PORTABLE CHEST 1 VIEW COMPARISON:  None. FINDINGS: Low lung volumes. Tip of the endotracheal tube overlies the proximal right mainstem bronchus. Esophageal tube tip is below the diaphragm but non included. Cardiomegaly with mild central vascular congestion. No pleural effusion or pneumothorax IMPRESSION: 1. Tip of the endotracheal tube projects over the proximal right mainstem bronchus 2. Cardiomegaly with central vascular congestion Critical Value/emergent results were called by telephone at the time of interpretation on 07/14/2018 at 1:35 am to Dr. Shanon Rosser , who verbally acknowledged these results. Electronically Signed   By: Donavan Foil M.D.   On: 07/14/2018 01:35    ROS Blood pressure (!) 151/85, pulse 91, temperature 99.1 F (37.3 C), resp. rate (!) 24, height '5\' 6"'$  (1.676 m), weight (!) 141.2 kg (311 lb 4.6 oz), SpO2 96 %. Physical Exam Physical Examination: General appearance - obese, alert Mental status - alert, oriented to person, place, and time Eyes - pupils equal and reactive, extraocular eye movements intact, funduscopic exam normal, discs flat and sharp Mouth - mucous membranes moist, pharynx normal without  lesions Neck - adenopathy noted PCL Lymphatics - posterior cervical nodes Chest - decreased bs, no R.R or W Heart - normal rate, regular rhythm, normal S1, S2, no murmurs, rubs, clicks or gallops Abdomen - obese, striae, pos bs, soft, nontender Extremities - pedal edema 1 + Skin - normal coloration and turgor, no rashes, no suspicious skin lesions noted  Assessment/Plan: 1 Veronica Waters OD at risk for serious toxicity.  Not clear where renal function is and needs clearance by guidelines.  Will get Temp cath , do Hd , monitor levels 2 OD 3 Hypertension: not an issue now 4. DM 5. Obesity P Temp cath, HD, monitor levels, cont ivf, follow Renal function  Jeneen Rinks Terius Jacuinde 07/14/2018, 9:06 PM

## 2018-07-14 NOTE — ED Notes (Signed)
Waiting for RT to transport to floor.

## 2018-07-14 NOTE — ED Notes (Signed)
Decreased gag reflex and corneal reflex. EDP at bedside. RT notified, preparing for intubation.

## 2018-07-14 NOTE — Progress Notes (Signed)
CRITICAL VALUE ALERT  Critical Value:  Lithium 2.70  Date & Time Notied:  Today @ 1945  Provider Notified: Elink called  Orders Received/Actions taken: pending

## 2018-07-14 NOTE — ED Notes (Signed)
Kennedy from lab called to add on differential.

## 2018-07-14 NOTE — Procedures (Signed)
Extubation Procedure Note  Patient Details:   Name: Veronica Waters DOB: 11/25/1991 MRN: [id number]   Airway Documentation:    Vent end date: 07/14/18 Vent end time: 1400   Evaluation  O2 sats: stable throughout Complications: No apparent complications Patient did tolerate procedure well. Bilateral Breath Sounds: Clear, Diminished   Yes  Dairl PonderWalters, Giulianna Rocha Nannette 07/14/2018, 2:09 PM   Positive cuff leak pre extubation. Placed on 3 lpm nasal cannula post extubation. RN aware.

## 2018-07-15 ENCOUNTER — Other Ambulatory Visit (HOSPITAL_COMMUNITY): Payer: Self-pay

## 2018-07-15 ENCOUNTER — Other Ambulatory Visit: Payer: Self-pay

## 2018-07-15 ENCOUNTER — Inpatient Hospital Stay (HOSPITAL_COMMUNITY): Payer: Self-pay

## 2018-07-15 DIAGNOSIS — T50902D Poisoning by unspecified drugs, medicaments and biological substances, intentional self-harm, subsequent encounter: Secondary | ICD-10-CM

## 2018-07-15 DIAGNOSIS — N179 Acute kidney failure, unspecified: Secondary | ICD-10-CM

## 2018-07-15 LAB — GLUCOSE, CAPILLARY
GLUCOSE-CAPILLARY: 66 mg/dL — AB (ref 70–99)
GLUCOSE-CAPILLARY: 101 mg/dL — AB (ref 70–99)
Glucose-Capillary: 102 mg/dL — ABNORMAL HIGH (ref 70–99)
Glucose-Capillary: 112 mg/dL — ABNORMAL HIGH (ref 70–99)
Glucose-Capillary: 73 mg/dL (ref 70–99)
Glucose-Capillary: 89 mg/dL (ref 70–99)
Glucose-Capillary: 95 mg/dL (ref 70–99)

## 2018-07-15 LAB — RENAL FUNCTION PANEL
ALBUMIN: 2.6 g/dL — AB (ref 3.5–5.0)
ALBUMIN: 2.5 g/dL — AB (ref 3.5–5.0)
Anion gap: 5 (ref 5–15)
Anion gap: 3 — ABNORMAL LOW (ref 5–15)
BUN: 4 mg/dL — AB (ref 6–20)
CHLORIDE: 114 mmol/L — AB (ref 98–111)
CO2: 22 mmol/L (ref 22–32)
CO2: 24 mmol/L (ref 22–32)
CREATININE: 0.7 mg/dL (ref 0.44–1.00)
CREATININE: 0.78 mg/dL (ref 0.44–1.00)
Calcium: 8.3 mg/dL — ABNORMAL LOW (ref 8.9–10.3)
Calcium: 8.1 mg/dL — ABNORMAL LOW (ref 8.9–10.3)
Chloride: 115 mmol/L — ABNORMAL HIGH (ref 98–111)
GFR calc Af Amer: >60 mL/min (ref >60)
GFR calc Af Amer: >60 mL/min (ref >60)
GFR calc non Af Amer: >60 mL/min (ref >60)
GFR calc non Af Amer: >60 mL/min (ref >60)
GLUCOSE: 118 mg/dL — AB (ref 70–99)
GLUCOSE: 93 mg/dL (ref 70–99)
PHOSPHORUS: 2.1 mg/dL — AB (ref 2.5–4.6)
POTASSIUM: 4.3 mmol/L (ref 3.5–5.1)
Phosphorus: 2.4 mg/dL — ABNORMAL LOW (ref 2.5–4.6)
Potassium: 2.7 mmol/L — CL (ref 3.5–5.1)
SODIUM: 142 mmol/L (ref 135–145)
Sodium: 141 mmol/L (ref 135–145)

## 2018-07-15 LAB — CBC
HEMATOCRIT: 33.1 % — AB (ref 36.0–46.0)
HEMATOCRIT: 33 % — AB (ref 36.0–46.0)
HEMOGLOBIN: 10 g/dL — AB (ref 12.0–15.0)
Hemoglobin: 10.1 g/dL — ABNORMAL LOW (ref 12.0–15.0)
MCH: 26.5 pg (ref 26.0–34.0)
MCH: 26.8 pg (ref 26.0–34.0)
MCHC: 30.2 g/dL (ref 30.0–36.0)
MCHC: 30.6 g/dL (ref 30.0–36.0)
MCV: 87.6 fL (ref 78.0–100.0)
MCV: 87.5 fL (ref 78.0–100.0)
PLATELETS: 291 10*3/uL (ref 150–400)
Platelets: 296 10*3/uL (ref 150–400)
RBC: 3.78 MIL/uL — ABNORMAL LOW (ref 3.87–5.11)
RBC: 3.77 MIL/uL — ABNORMAL LOW (ref 3.87–5.11)
RDW: 13.5 % (ref 11.5–15.5)
RDW: 13.5 % (ref 11.5–15.5)
WBC: 9.3 10*3/uL (ref 4.0–10.5)
WBC: 9.5 10*3/uL (ref 4.0–10.5)

## 2018-07-15 LAB — PROCALCITONIN: Procalcitonin: <0.1 ng/mL

## 2018-07-15 LAB — LITHIUM LEVEL
LITHIUM LVL: 0.57 mmol/L — AB (ref 0.60–1.20)
Lithium Lvl: 2.68 mmol/L (ref 0.60–1.20)
Lithium Lvl: 1.99 mmol/L (ref 0.60–1.20)
Lithium Lvl: 0.67 mmol/L (ref 0.60–1.20)

## 2018-07-15 LAB — BASIC METABOLIC PANEL
ANION GAP: 8 (ref 5–15)
BUN: <5 mg/dL — ABNORMAL LOW (ref 6–20)
CALCIUM: 7.5 mg/dL — AB (ref 8.9–10.3)
CHLORIDE: 105 mmol/L (ref 98–111)
CO2: 27 mmol/L (ref 22–32)
CREATININE: 0.57 mg/dL (ref 0.44–1.00)
GFR calc Af Amer: >60 mL/min (ref >60)
GFR calc non Af Amer: >60 mL/min (ref >60)
Glucose, Bld: 100 mg/dL — ABNORMAL HIGH (ref 70–99)
Potassium: 2.9 mmol/L — ABNORMAL LOW (ref 3.5–5.1)
SODIUM: 140 mmol/L (ref 135–145)

## 2018-07-15 LAB — MAGNESIUM: MAGNESIUM: 1.6 mg/dL — AB (ref 1.7–2.4)

## 2018-07-15 LAB — PHOSPHORUS: PHOSPHORUS: 1.6 mg/dL — AB (ref 2.5–4.6)

## 2018-07-15 MED ORDER — DEXTROSE 50 % IV SOLN
25.0000 g | Freq: Once | INTRAVENOUS | Status: AC
  Administered 2018-07-15: 25 g via INTRAVENOUS

## 2018-07-15 MED ORDER — DEXTROSE 50 % IV SOLN
INTRAVENOUS | Status: AC
  Administered 2018-07-15: 50 mL
  Filled 2018-07-15: qty 50

## 2018-07-15 MED ORDER — POTASSIUM PHOSPHATES 15 MMOLE/5ML IV SOLN
20.0000 mmol | Freq: Once | INTRAVENOUS | Status: AC
  Filled 2018-07-15: qty 6.67

## 2018-07-15 MED ORDER — LIDOCAINE-PRILOCAINE 2.5-2.5 % EX CREA
1.0000 "application " | TOPICAL_CREAM | CUTANEOUS | Status: DC | PRN
  Filled 2018-07-15: qty 5

## 2018-07-15 MED ORDER — PENTAFLUOROPROP-TETRAFLUOROETH EX AERO: 1.0000 "application " | INHALATION_SPRAY | CUTANEOUS | Status: DC | PRN

## 2018-07-15 MED ORDER — HEPARIN SODIUM (PORCINE) 1000 UNIT/ML IJ SOLN
2400.0000 [IU] | Freq: Once | INTRAMUSCULAR | Status: AC
  Administered 2018-07-15: 2400 [IU] via INTRAVENOUS
  Filled 2018-07-15: qty 3

## 2018-07-15 MED ORDER — ALTEPLASE 2 MG IJ SOLR
2.0000 mg | Freq: Once | INTRAMUSCULAR | Status: DC | PRN
  Filled 2018-07-15: qty 2

## 2018-07-15 MED ORDER — POTASSIUM PHOSPHATES 15 MMOLE/5ML IV SOLN
20.0000 mmol | Freq: Once | INTRAVENOUS | Status: DC
  Administered 2018-07-15: 20 mmol via INTRAVENOUS
  Filled 2018-07-15: qty 6.67

## 2018-07-15 MED ORDER — POTASSIUM CHLORIDE CRYS ER 20 MEQ PO TBCR
40.0000 meq | EXTENDED_RELEASE_TABLET | Freq: Two times a day (BID) | ORAL | Status: DC
  Administered 2018-07-15: 40 meq via ORAL
  Filled 2018-07-15: qty 2

## 2018-07-15 MED ORDER — SODIUM CHLORIDE 0.9 % IV SOLN: 100.0000 mL | INTRAVENOUS | Status: DC | PRN

## 2018-07-15 MED ORDER — LIDOCAINE HCL (PF) 1 % IJ SOLN: 5.0000 mL | INTRAMUSCULAR | Status: DC | PRN

## 2018-07-15 MED ORDER — HEPARIN SODIUM (PORCINE) 1000 UNIT/ML DIALYSIS: 1000.0000 [IU] | INTRAMUSCULAR | Status: DC | PRN

## 2018-07-15 MED ORDER — HEPARIN SODIUM (PORCINE) 1000 UNIT/ML DIALYSIS
100.0000 [IU]/kg | INTRAMUSCULAR | Status: DC | PRN
  Filled 2018-07-15: qty 15

## 2018-07-15 NOTE — Progress Notes (Signed)
Noted decreasing lithium levels, 0323 repeat level at 1.99.  Spoke with Dr. Darrick Pennaeterding who still wants to proceed with dialysis.  Attempted to call mother, Darvin Neighboursammie Goonan at [phone], to obtain consent for HD catheter placement, however no answer.  Message left to return call to [phone].

## 2018-07-15 NOTE — Progress Notes (Addendum)
Called to check on patient in HD. Patient due back to [id number] within the hour per RN

## 2018-07-15 NOTE — Progress Notes (Signed)
[facility] ROUNDING NOTE   Subjective:   Seen on dialysis   Running 5 hrs   Cr 0.7   Li level 2.7      Good urine output and appears to have no renal issues   Objective:  Vital signs in last 24 hours:  Temp:  [98.4 F (36.9 C)-99.5 F (37.5 C)] 98.4 F (36.9 C) (07/23 0950) Pulse Rate:  [84-93] (P) 88 (07/23 1215) Resp:  [22-36] (P) 29 (07/23 1215) BP: (98-158)/(52-114) (P) 153/77 (07/23 1215) SpO2:  [88 %-100 %] (P) 98 % (07/23 1215) Weight:  [311 lb 15.2 oz (141.5 kg)] 311 lb 15.2 oz (141.5 kg) (07/23 0945)  Weight change: 10.6 oz (0.3 kg) Filed Weights   07/14/18 0400 07/15/18 0223 07/15/18 0945  Weight: (!) 311 lb 4.6 oz (141.2 kg) (!) 311 lb 15.2 oz (141.5 kg) (!) 311 lb 15.2 oz (141.5 kg)    Intake/Output: I/O last 3 completed shifts: In: 5432.9 [I.V.:4432.5; IV Piggyback:1000.4] Out: 4870 [Urine:4870]   Intake/Output this shift:  Total I/O In: 490 [P.O.:240; I.V.:250] Out: 525 [Urine:525]  CVS- RRR RS- CTA ABD- BS present soft non-distended EXT- no edema   Basic Metabolic Panel: Recent Labs  Lab 07/14/18 0021 07/14/18 0522 07/14/18 1101 07/14/18 1810 07/15/18 0326 07/15/18 1042  NA 144  --  143 144 141 142  K 3.9  --  4.0 3.9 4.3 2.7*  CL 114*  --  114* 115* 114* 115*  CO2 22  --  25 25 22 24   GLUCOSE 98  --  109* 91 118* 93  BUN 6  --  6 6 4* <5*  CREATININE 0.70  --  0.68 0.71 0.70 0.78  CALCIUM 8.1*  --  8.4* 8.6* 8.3* 8.1*  MG  --  2.2  --   --   --   --   PHOS  --  3.7  --   --  2.4* 2.1*    Liver Function Tests: Recent Labs  Lab 07/14/18 0021 07/14/18 1101 07/15/18 0326 07/15/18 1042  AST 17 27  --   --   ALT 22 34  --   --   ALKPHOS 67 67  --   --   BILITOT 0.2* 0.4  --   --   PROT 6.8 7.1  --   --   ALBUMIN 3.0* 3.1* 2.6* 2.5*   No results for input(s): LIPASE, AMYLASE in the last 168 hours. No results for input(s): AMMONIA in the last 168 hours.  CBC: Recent Labs  Lab 07/14/18 0021 07/15/18 0400  07/15/18 1045  WBC 7.4  7.3 9.3 9.5  NEUTROABS 4.5  --   --   HGB 11.2*  11.2* 10.0* 10.1*  HCT 35.3*  34.6* 33.1* 33.0*  MCV 85.9  85.9 87.6 87.5  PLT 383  359 296 291    Cardiac Enzymes: No results for input(s): CKTOTAL, CKMB, CKMBINDEX, TROPONINI in the last 168 hours.  BNP: Invalid input(s): POCBNP  CBG: Recent Labs  Lab 07/14/18 2339 07/15/18 0230 07/15/18 0313 07/15/18 0735 07/15/18 0854  GLUCAP 80 66* 112* 73 101*    Microbiology: Results for orders placed or performed during the hospital encounter of 07/14/18  MRSA PCR Screening     Status: None   Collection Time: 07/14/18  3:30 AM  Result Value Ref Range Status   MRSA by PCR NEGATIVE NEGATIVE Final    Comment:        The GeneXpert MRSA Assay (FDA approved for NASAL  specimens only), is one component of a comprehensive MRSA colonization surveillance program. It is not intended to diagnose MRSA infection nor to guide or monitor treatment for MRSA infections. Performed at [facility], [address], [place], [place] [address]     Coagulation Studies: No results for input(s): LABPROT, INR in the last 72 hours.  Urinalysis: No results for input(s): COLORURINE, LABSPEC, PHURINE, GLUCOSEU, HGBUR, BILIRUBINUR, KETONESUR, PROTEINUR, UROBILINOGEN, NITRITE, LEUKOCYTESUR in the last 72 hours.  Invalid input(s): APPERANCEUR    Imaging: Dg Abdomen 1 View  Result Date: 07/14/2018 CLINICAL DATA:  NG tube placement EXAM: ABDOMEN - 1 VIEW COMPARISON:  None. FINDINGS: Enteric tube tip is in the right upper quadrant consistent with location in the distal stomach. IMPRESSION: Enteric tube tip is in the right upper quadrant consistent with location in the distal stomach. Electronically Signed   By: Burman Nieves M.D.   On: 07/14/2018 02:17   Dg Chest Port 1 View  Result Date: 07/15/2018 CLINICAL DATA:  Line placement EXAM: PORTABLE CHEST 1 VIEW COMPARISON:  07/14/2018 FINDINGS: A right  central venous catheter has been placed with tip over the cavoatrial junction region. No pneumothorax. Interval removal of endotracheal and enteric tubes. Cardiac enlargement. Mild perihilar infiltrates could represent edema or pneumonia. No blunting of costophrenic angles. Mediastinal contours appear intact. IMPRESSION: Right central venous catheter with tip over the cavoatrial junction region. Cardiac enlargement with mild perihilar infiltrates suggesting edema or pneumonia. Electronically Signed   By: Burman Nieves M.D.   On: 07/15/2018 05:56   Dg Chest Portable 1 View  Result Date: 07/14/2018 CLINICAL DATA:  Reposition endotracheal tube EXAM: PORTABLE CHEST 1 VIEW COMPARISON:  07/14/2018 FINDINGS: Endotracheal tube tip measures 3.8 cm above the carina, demonstrating improved position since previous study. Enteric tube tip is off the field of view but below the left hemidiaphragm. Shallow inspiration. Cardiac enlargement. No focal airspace disease. No blunting of costophrenic angles. IMPRESSION: Endotracheal tube has been repositioned with tip now measuring 3.8 cm above the carina. Electronically Signed   By: Burman Nieves M.D.   On: 07/14/2018 02:21   Dg Chest Portable 1 View  Result Date: 07/14/2018 CLINICAL DATA:  Intubated EXAM: PORTABLE CHEST 1 VIEW COMPARISON:  None. FINDINGS: Low lung volumes. Tip of the endotracheal tube overlies the proximal right mainstem bronchus. Esophageal tube tip is below the diaphragm but non included. Cardiomegaly with mild central vascular congestion. No pleural effusion or pneumothorax IMPRESSION: 1. Tip of the endotracheal tube projects over the proximal right mainstem bronchus 2. Cardiomegaly with central vascular congestion Critical Value/emergent results were called by telephone at the time of interpretation on 07/14/2018 at 1:35 am to Dr. Paula Libra , who verbally acknowledged these results. Electronically Signed   By: Jasmine Pang M.D.   On: 07/14/2018  01:35     Medications:   . sodium chloride    . sodium chloride 250 mL/hr at 07/15/18 0824  . sodium chloride    . sodium chloride     . Chlorhexidine Gluconate Cloth  6 each Topical Q0600  . folic acid  1 mg Per Tube Daily  . heparin  5,000 Units Subcutaneous Q8H  . insulin aspart  0-15 Units Subcutaneous Q4H  . levothyroxine  50 mcg Per Tube QAC breakfast  . thiamine  100 mg Per Tube Daily   sodium chloride, sodium chloride, sodium chloride, alteplase, heparin, heparin, lidocaine (PF), lidocaine-prilocaine, pentafluoroprop-tetrafluoroeth  Assessment/ Plan:   1. Li overdose  Mental status is great  She appears to be doing well on dialysis   Will check 2 hour post lithium 2. OD  Sitter at bedside 3. Hypertension  Controlled 4. Diabetes as per primary team   LOS: 1 Gurtej Noyola W @TODAY @1 :05 PM

## 2018-07-15 NOTE — Procedures (Addendum)
Central Venous Hemodialysis Catheter Insertion Procedure Note Veronica Waters [id number] [birth year]  Procedure: Insertion of Central Venous Catheter Indications: Elevated lithium levels; need for dialysis   Procedure Details Consent: Risks of procedure as well as the alternatives and risks of each were explained to the (patient/caregiver).  Consent for procedure obtained. Time Out: Verified patient identification, verified procedure, site/side was marked, verified correct patient position, special equipment/implants available, medications/allergies/relevent history reviewed, required imaging and test results available.  Performed  Maximum sterile technique was used including antiseptics, cap, gloves, gown, hand hygiene, mask and sheet. Skin prep: Chlorhexidine; local anesthetic administered 5ml Lidocaine 1% A antimicrobial bonded/coated triple lumen trialysis catheter was placed in the right internal jugular vein using the Seldinger technique to 16 cm.  Line sutured. Biopatch placed and sterile dressing applied.   Heparin 1400 units inserted into each red/ blue port to indwell.   Evaluation Blood flow good Complications: No apparent complications Patient did tolerate procedure well. Chest X-ray ordered to verify placement.  CXR: pending.  Procedure performed with ultrasound guidance for real time vessel cannulation.     Veronica Waters, AGACNP-BC [facility] Pgr: [phone] or if no answer [phone] 07/15/2018, 5:50 AM

## 2018-07-15 NOTE — Progress Notes (Signed)
CRITICAL VALUE ALERT  Critical Value:  CBG 66  Date & Time Notied:  0330  Provider Notified: Posey BoyerBrooke Simpson  Orders Received/Actions taken: Hypoglycemic protocol

## 2018-07-15 NOTE — Progress Notes (Signed)
eLink Physician-Brief Progress Note Patient Name: Janne Napoleonrica Hartfield DOB: [birth year] MRN: [id number]   Date of Service  07/15/2018  HPI/Events of Note  Lithium level going up, transfer ordered placed 8 PM. Multiple bowel movement  eICU Interventions       Intervention Category Major Interventions: Other:  YACOUB,WESAM 07/15/2018, 12:12 AM

## 2018-07-15 NOTE — Progress Notes (Signed)
CRITICAL VALUE ALERT  Critical Value:  Potassium 2.7  Date & Time Notied:  07/15/18 1150  Provider Notified: Hemodialysis nurse  Orders Received/Actions taken: nurse states that she has a protocol for low potassium levels while in Hemodialysis

## 2018-07-15 NOTE — Progress Notes (Signed)
CRITICAL VALUE ALERT  Critical Value:  Lithium 1.99  Date & Time Notied:  0400  Provider Notified: Posey BoyerBrooke Simpson  Orders Received/Actions taken: Verbal order.  Continue to monitor.

## 2018-07-15 NOTE — Consult Note (Addendum)
[facility] Consult   Reason for Consult:  Suicide attempt  Referring Physician:  Dr. Vaughan Browner  Patient Identification: Veronica Waters MRN:  [id number] Principal Diagnosis: Suicide attempt [facility]) Diagnosis:   Patient Active Problem List   Diagnosis Date Noted  . Overdose [T50.901A] 07/14/2018  . Suicide attempt ([place]) [T14.91XA]   . Endotracheally intubated [Z97.8]   . Posttraumatic stress disorder [F43.10]   . Borderline personality disorder ([place]) [F60.3]   . Diabetes mellitus type 2 in obese (H[facility]) [E11.69, E66.9]   . Bipolar 1 disorder, mixed, severe ([place]) [F31.63] 06/28/2018  . Tachycardia [R00.0] 01/18/2018  . Severe recurrent major depression without psychotic features ([place]) [F33.2] 01/05/2018  . MDD (major depressive disorder), recurrent severe, without psychosis ([place]) [F33.2] 09/01/2017    Total Time spent with patient: 1 hour  Subjective:   Veronica Waters is a 27 y.o. female patient admitted with multiple drug overdose.  HPI:   Per chart review, patient was admitted with multiple drug overdose requiring dialysis and intubation for airway protection. She reportedly overdosed on Seroquel, Lithium, Propranolol, Prozac and Lisinopril.  Lithium level was 2.68. Home medications include Klonopin 0.5 mg TID PRN (PMP indicates no prescriptions filled since April), Prozac 10 mg daily, Gabapentin 600 mg TID, Lithium 600 mg BID, Mleatonin 3 mg qhs PRN, Naltrexone 25 mg daily,  Seroquel 50 mg BID and 200 mg qhs.    On interview, Veronica Waters reports that she has not found anything helpful for her mood.  She does not remember the last time that she was doing well.  She reports chronic, intermittent SI worsening yesterday due to "drama" with her friends and family.  She reports telling her boyfriend that she would overdose and he said that she would not do it.  She reports overdosing on multiple medications including Klonopin 0.5 mg tablets (#20) and Gabapentin 600 mg tablets (#10).  She is  unsure of the quantity she ingested of the other medications.  She reports that the Bobbye [place] is an old prescription and she no longer takes it.  She reports last feeling hypomanic for 4 days after she was discharged from [facility].  She endorses current SI.  She denies HI or AVH.  She reports fair sleep and appetite.  She reports attending group DBT in the past and found it helpful for her mood.  She was then in a PHP at [facility].  Past Psychiatric History: MDD, GAD, PTSD, BPAD, BPD and alcohol abuse. She has a history of multiple suicide attempts and a history of cutting.   Risk to Self:  Yes given recent suicide attempt by overdose.  Risk to Others:  None. Denies HI.  Prior Inpatient Therapy:  She has a history of multiple hospitalizations and was last hospitalized at [facility] from 7/6-7/13 for bipolar disorder with psychosis.  Prior Outpatient Therapy:  She is followed at [facility] outpatient in [place]. Prior medications include Klonopin.   Past Medical History:  Past Medical History:  Diagnosis Date  . Asthma   . Hypertension   . Major depressive disorder    History reviewed. No pertinent surgical history. Family History:  Family History  Family history unknown: Yes   Family Psychiatric  History: Sister-depression and anxiety.  Social History:  Social History   Substance and Sexual Activity  Alcohol Use No     Social History   Substance and Sexual Activity  Drug Use No    Social History   Socioeconomic History  . Marital status: Divorced  Spouse name: Not on file  . Number of children: Not on file  . Years of education: Not on file  . Highest education level: Not on file  Occupational History  . Not on file  Social Needs  . Financial resource strain: Not on file  . Food insecurity:    Worry: Not on file    Inability: Not on file  . Transportation needs:    Medical: Not on file    Non-medical: Not on file  Tobacco Use  . Smoking status: Never Smoker  . Smokeless  tobacco: Never Used  Substance and Sexual Activity  . Alcohol use: No  . Drug use: No  . Sexual activity: Never    Birth control/protection: Other-see comments    Comment: Rod in left arm for birth control  Lifestyle  . Physical activity:    Days per week: Not on file    Minutes per session: Not on file  . Stress: Not on file  Relationships  . Social connections:    Talks on phone: Not on file    Gets together: Not on file    Attends religious service: Not on file    Active member of club or organization: Not on file    Attends meetings of clubs or organizations: Not on file    Relationship status: Not on file  Other Topics Concern  . Not on file  Social History Narrative  . Not on file   Additional Social History: She lives at home alone. She has a boyfriend of 2 weeks. She has known him since high school. She works as a Quarry manager at a [employer], operational. She reports episodic binge drinking. She denies illicit substance use. She reports smoking marijuana use 2 weeks ago although this is uncommon for her.     Allergies:   Allergies  Allergen Reactions  . Aspirin Hives  . Cranberry Hives and Swelling  . Cranberry Extract Anaphylaxis  . Hydrocodone-Acetaminophen Hives  . Hydrocodone Hives  . Trazodone And Nefazodone Other (See Comments)    hallucinations   . Vicodin [Hydrocodone-Acetaminophen] Hives    Labs:  Results for orders placed or performed during the hospital encounter of 07/14/18 (from the past 48 hour(s))  Comprehensive metabolic panel     Status: Abnormal   Collection Time: 07/14/18 12:21 AM  Result Value Ref Range   Sodium 144 135 - 145 mmol/L   Potassium 3.9 3.5 - 5.1 mmol/L   Chloride 114 (H) 98 - 111 mmol/L    Comment: Please note change in reference range.   CO2 22 22 - 32 mmol/L   Glucose, Bld 98 70 - 99 mg/dL    Comment: Please note change in reference range.   BUN 6 6 - 20 mg/dL    Comment: Please note change in reference range.   Creatinine,  Ser 0.70 0.44 - 1.00 mg/dL   Calcium 8.1 (L) 8.9 - 10.3 mg/dL   Total Protein 6.8 6.5 - 8.1 g/dL   Albumin 3.0 (L) 3.5 - 5.0 g/dL   AST 17 15 - 41 U/L   ALT 22 0 - 44 U/L    Comment: Please note change in reference range.   Alkaline Phosphatase 67 38 - 126 U/L   Total Bilirubin 0.2 (L) 0.3 - 1.2 mg/dL   GFR calc non Af Amer >60 >60 mL/min   GFR calc Af Amer >60 >60 mL/min    Comment: (NOTE) The eGFR has been calculated using the CKD EPI equation. This  calculation has not been validated in all clinical situations. eGFR's persistently <60 mL/min signify possible Chronic Kidney Disease.    Anion gap 8 5 - 15    Comment: Performed at [facility] [place] [address], [place], [place] [address]  Ethanol     Status: Abnormal   Collection Time: 07/14/18 12:21 AM  Result Value Ref Range   Alcohol, Ethyl (B) 40 (H) <10 mg/dL    Comment: (NOTE) Lowest detectable limit for serum alcohol is 10 mg/dL. For medical purposes only. Performed at [facility], [place] [address] [place], [place] [address]   Salicylate level     Status: None   Collection Time: 07/14/18 12:21 AM  Result Value Ref Range   Salicylate Lvl <7.9 2.8 - 30.0 mg/dL    Comment: Performed at [facility], [place] [address], [place], [place] [address]  Acetaminophen level     Status: Abnormal   Collection Time: 07/14/18 12:21 AM  Result Value Ref Range   Acetaminophen (Tylenol), Serum <10 (L) 10 - 30 ug/mL    Comment: (NOTE) Therapeutic concentrations vary significantly. A range of 10-30 ug/mL  may be an effective concentration for many patients. However, some  are best treated at concentrations outside of this range. Acetaminophen concentrations >150 ug/mL at 4 hours after ingestion  and >50 ug/mL at 12 hours after ingestion are often associated with  toxic reactions. Performed at [facility], [place] [address], [place], [place] [address]   cbc      Status: Abnormal   Collection Time: 07/14/18 12:21 AM  Result Value Ref Range   WBC 7.3 4.0 - 10.5 K/uL   RBC 4.03 3.87 - 5.11 MIL/uL   Hemoglobin 11.2 (L) 12.0 - 15.0 g/dL   HCT 34.6 (L) 36.0 - 46.0 %   MCV 85.9 78.0 - 100.0 fL   MCH 27.8 26.0 - 34.0 pg   MCHC 32.4 30.0 - 36.0 g/dL   RDW 13.9 11.5 - 15.5 %   Platelets 359 150 - 400 K/uL    Comment: Performed at [facility], [place] [address] [place], [place] [address]  CBC with Differential/Platelet     Status: Abnormal   Collection Time: 07/14/18 12:21 AM  Result Value Ref Range   WBC 7.4 4.0 - 10.5 K/uL   RBC 4.11 3.87 - 5.11 MIL/uL   Hemoglobin 11.2 (L) 12.0 - 15.0 g/dL   HCT 35.3 (L) 36.0 - 46.0 %   MCV 85.9 78.0 - 100.0 fL   MCH 27.3 26.0 - 34.0 pg   MCHC 31.7 30.0 - 36.0 g/dL   RDW 14.0 11.5 - 15.5 %   Platelets 383 150 - 400 K/uL   Neutrophils Relative % 60 %   Neutro Abs 4.5 1.7 - 7.7 K/uL   Lymphocytes Relative 30 %   Lymphs Abs 2.2 0.7 - 4.0 K/uL   Monocytes Relative 7 %   Monocytes Absolute 0.5 0.1 - 1.0 K/uL   Eosinophils Relative 2 %   Eosinophils Absolute 0.2 0.0 - 0.7 K/uL   Basophils Relative 1 %   Basophils Absolute 0.0 0.0 - 0.1 K/uL    Comment: Performed at [facility], [place] [address], [place], [place] [address]  CBG monitoring, ED     Status: None   Collection Time: 07/14/18 12:46 AM  Result Value Ref Range   Glucose-Capillary 91 70 - 99 mg/dL  Rapid urine drug screen (hospital performed)  Status: None   Collection Time: 07/14/18  1:02 AM  Result Value Ref Range   Opiates NONE DETECTED NONE DETECTED   Cocaine NONE DETECTED NONE DETECTED   Benzodiazepines NONE DETECTED NONE DETECTED   Amphetamines NONE DETECTED NONE DETECTED   Tetrahydrocannabinol NONE DETECTED NONE DETECTED   Barbiturates NONE DETECTED NONE DETECTED    Comment: (NOTE) DRUG SCREEN FOR MEDICAL PURPOSES ONLY.  IF CONFIRMATION IS NEEDED FOR ANY PURPOSE, NOTIFY LAB WITHIN 5 DAYS. LOWEST  DETECTABLE LIMITS FOR URINE DRUG SCREEN Drug Class                     Cutoff (ng/mL) Amphetamine and metabolites    1000 Barbiturate and metabolites    200 Benzodiazepine                 300 Tricyclics and metabolites     300 Opiates and metabolites        300 Cocaine and metabolites        300 THC                            50 Performed at [facility], [place] [address] [place], [place] [address]   Lithium level     Status: None   Collection Time: 07/14/18  1:02 AM  Result Value Ref Range   Lithium Lvl 0.88 0.60 - 1.20 mmol/L    Comment: Performed at [facility], [place] [address], [place], [place] [address]  I-Stat beta hCG blood, ED     Status: None   Collection Time: 07/14/18  1:18 AM  Result Value Ref Range   I-stat hCG, quantitative <5.0 <5 mIU/mL   Comment 3            Comment:   GEST. AGE      CONC.  (mIU/mL)   <=1 WEEK        5 - 50     2 WEEKS       50 - 500     3 WEEKS       100 - 10,000     4 WEEKS     1,000 - 30,000        FEMALE AND NON-PREGNANT FEMALE:     LESS THAN 5 mIU/mL   Blood gas, arterial     Status: Abnormal   Collection Time: 07/14/18  2:20 AM  Result Value Ref Range   FIO2 70.00    Delivery systems VENTILATOR    Mode PRESSURE REGULATED VOLUME CONTROL    VT 470 mL   LHR 20 resp/min   Peep/cpap 5.0 cm H20   pH, Arterial 7.315 (L) 7.350 - 7.450   pCO2 arterial 41.0 32.0 - 48.0 mmHg   pO2, Arterial 260 (H) 83.0 - 108.0 mmHg   Bicarbonate 20.3 20.0 - 28.0 mmol/L   Acid-base deficit 5.0 (H) 0.0 - 2.0 mmol/L   O2 Saturation 99.1 %   Patient temperature 36.9    Collection site RIGHT RADIAL    Drawn by [id number]    Sample type ARTERIAL    Allens test (pass/fail) PASS PASS    Comment: Performed at [facility], [place] [address] [place], [place] [address]  MRSA PCR Screening     Status: None   Collection Time: 07/14/18  3:30 AM  Result Value Ref Range   MRSA by PCR NEGATIVE NEGATIVE    Comment:  The GeneXpert MRSA Assay (FDA approved for NASAL specimens only), is one component of a comprehensive MRSA colonization surveillance program. It is not intended to diagnose MRSA infection nor to guide or monitor treatment for MRSA infections. Performed at [facility], [place] [address], [place], [place] [address]   Glucose, capillary     Status: None   Collection Time: 07/14/18  4:44 AM  Result Value Ref Range   Glucose-Capillary 77 70 - 99 mg/dL  Magnesium     Status: None   Collection Time: 07/14/18  5:22 AM  Result Value Ref Range   Magnesium 2.2 1.7 - 2.4 mg/dL    Comment: Performed at [facility], [place] [address], [place], [place] [address]  Phosphorus     Status: None   Collection Time: 07/14/18  5:22 AM  Result Value Ref Range   Phosphorus 3.7 2.5 - 4.6 mg/dL    Comment: Performed at [facility], [place] [address], [place], [place] [address]  Lithium level     Status: Abnormal   Collection Time: 07/14/18  5:22 AM  Result Value Ref Range   Lithium Lvl 1.27 (H) 0.60 - 1.20 mmol/L    Comment: Performed at [facility], [place] [address] [place], [place] [address]  Glucose, capillary     Status: Abnormal   Collection Time: 07/14/18  7:49 AM  Result Value Ref Range   Glucose-Capillary 125 (H) 70 - 99 mg/dL  Acetaminophen level     Status: Abnormal   Collection Time: 07/14/18 11:01 AM  Result Value Ref Range   Acetaminophen (Tylenol), Serum <10 (L) 10 - 30 ug/mL    Comment: (NOTE) Therapeutic concentrations vary significantly. A range of 10-30 ug/mL  may be an effective concentration for many patients. However, some  are best treated at concentrations outside of this range. Acetaminophen concentrations >150 ug/mL at 4 hours after ingestion  and >50 ug/mL at 12 hours after ingestion are often associated with  toxic reactions. Performed at [facility], [place] [address] [place], [place] [address]   TSH     Status: None   Collection Time: 07/14/18 11:01 AM  Result Value Ref Range   TSH 1.730 0.350 - 4.500 uIU/mL    Comment: Performed by a 3rd Generation assay with a functional sensitivity of <=0.01 uIU/mL. Performed at [facility], [place] [address], [place], [place] [address]   Comprehensive metabolic panel     Status: Abnormal   Collection Time: 07/14/18 11:01 AM  Result Value Ref Range   Sodium 143 135 - 145 mmol/L   Potassium 4.0 3.5 - 5.1 mmol/L   Chloride 114 (H) 98 - 111 mmol/L   CO2 25 22 - 32 mmol/L   Glucose, Bld 109 (H) 70 - 99 mg/dL   BUN 6 6 - 20 mg/dL   Creatinine, Ser 0.68 0.44 - 1.00 mg/dL   Calcium 8.4 (L) 8.9 - 10.3 mg/dL   Total Protein 7.1 6.5 - 8.1 g/dL   Albumin 3.1 (L) 3.5 - 5.0 g/dL   AST 27 15 - 41 U/L   ALT 34 0 - 44 U/L   Alkaline Phosphatase 67 38 - 126 U/L   Total Bilirubin 0.4 0.3 - 1.2 mg/dL   GFR calc non Af Amer >60 >60 mL/min   GFR calc Af Amer >60 >60 mL/min    Comment: (NOTE) The eGFR has been calculated using the CKD EPI equation. This calculation has not  been validated in all clinical situations. eGFR's persistently <60 mL/min signify possible Chronic Kidney Disease.    Anion gap 4 (L) 5 - 15    Comment: Performed at [facility], [place] [address], [place], [place] [address]  Glucose, capillary     Status: None   Collection Time: 07/14/18 11:17 AM  Result Value Ref Range   Glucose-Capillary 99 70 - 99 mg/dL  Lithium level     Status: Abnormal   Collection Time: 07/14/18  1:47 PM  Result Value Ref Range   Lithium Lvl 1.83 (HH) 0.60 - 1.20 mmol/L    Comment: CRITICAL RESULT CALLED TO, READ BACK BY AND VERIFIED WITH: T.OLEARY AT 1441 ON 07/14/18 BY N.THOMPSON Performed at [facility], [place] [address], [place], [place] [address]   Glucose, capillary     Status: None   Collection Time: 07/14/18  5:15 PM  Result Value Ref Range   Glucose-Capillary  99 70 - 99 mg/dL  Basic metabolic panel     Status: Abnormal   Collection Time: 07/14/18  6:10 PM  Result Value Ref Range   Sodium 144 135 - 145 mmol/L   Potassium 3.9 3.5 - 5.1 mmol/L   Chloride 115 (H) 98 - 111 mmol/L   CO2 25 22 - 32 mmol/L   Glucose, Bld 91 70 - 99 mg/dL   BUN 6 6 - 20 mg/dL   Creatinine, Ser 0.71 0.44 - 1.00 mg/dL   Calcium 8.6 (L) 8.9 - 10.3 mg/dL   GFR calc non Af Amer >60 >60 mL/min   GFR calc Af Amer >60 >60 mL/min    Comment: (NOTE) The eGFR has been calculated using the CKD EPI equation. This calculation has not been validated in all clinical situations. eGFR's persistently <60 mL/min signify possible Chronic Kidney Disease.    Anion gap 4 (L) 5 - 15    Comment: Performed at [facility], [place] [address] [place], [place] [address]  Lithium level     Status: Abnormal   Collection Time: 07/14/18  6:40 PM  Result Value Ref Range   Lithium Lvl 2.70 (HH) 0.60 - 1.20 mmol/L    Comment: CRITICAL RESULT CALLED TO, READ BACK BY AND VERIFIED WITH: A.BRASWELL AT 1944 ON 07/14/18 BY N.THOMPSON Performed at [facility], [place] [address], [place], [place] [address]   Glucose, capillary     Status: None   Collection Time: 07/14/18  8:01 PM  Result Value Ref Range   Glucose-Capillary 83 70 - 99 mg/dL   Comment 1 Notify RN   Lithium level     Status: Abnormal   Collection Time: 07/14/18 10:06 PM  Result Value Ref Range   Lithium Lvl 2.68 (HH) 0.60 - 1.20 mmol/L    Comment: CRITICAL RESULT CALLED TO, READ BACK BY AND VERIFIED WITH: Archie Balboa RN 2706 07/15/18 A NAVARRO Performed at [facility], [place] [address], [place], [place] [address]   Glucose, capillary     Status: None   Collection Time: 07/14/18 11:39 PM  Result Value Ref Range   Glucose-Capillary 80 70 - 99 mg/dL   Comment 1 Notify RN   Glucose, capillary     Status: Abnormal   Collection Time: 07/15/18  2:30 AM  Result Value Ref Range    Glucose-Capillary 66 (L) 70 - 99 mg/dL   Comment 1 Capillary Specimen   Glucose, capillary     Status: Abnormal   Collection Time: 07/15/18  3:13 AM  Result Value Ref Range   Glucose-Capillary 112 (H) 70 - 99 mg/dL   Comment 1 Notify RN   Lithium level     Status: Abnormal   Collection Time: 07/15/18  3:23 AM  Result Value Ref Range   Lithium Lvl 1.99 (HH) 0.60 - 1.20 mmol/L    Comment: CRITICAL RESULT CALLED TO, READ BACK BY AND VERIFIED WITH: HASSAN Z,RN 07/15/18 0359 WAYK Performed at [facility], [place] [address], [place], [place] [address]   Renal function panel     Status: Abnormal   Collection Time: 07/15/18  3:26 AM  Result Value Ref Range   Sodium 141 135 - 145 mmol/L   Potassium 4.3 3.5 - 5.1 mmol/L    Comment: SLIGHT HEMOLYSIS   Chloride 114 (H) 98 - 111 mmol/L   CO2 22 22 - 32 mmol/L   Glucose, Bld 118 (H) 70 - 99 mg/dL   BUN 4 (L) 6 - 20 mg/dL   Creatinine, Ser 0.70 0.44 - 1.00 mg/dL   Calcium 8.3 (L) 8.9 - 10.3 mg/dL   Phosphorus 2.4 (L) 2.5 - 4.6 mg/dL   Albumin 2.6 (L) 3.5 - 5.0 g/dL   GFR calc non Af Amer >60 >60 mL/min   GFR calc Af Amer >60 >60 mL/min    Comment: (NOTE) The eGFR has been calculated using the CKD EPI equation. This calculation has not been validated in all clinical situations. eGFR's persistently <60 mL/min signify possible Chronic Kidney Disease.    Anion gap 5 5 - 15    Comment: Performed at [place] [address], [place], [place] [address]  CBC     Status: Abnormal   Collection Time: 07/15/18  4:00 AM  Result Value Ref Range   WBC 9.3 4.0 - 10.5 K/uL   RBC 3.78 (L) 3.87 - 5.11 MIL/uL   Hemoglobin 10.0 (L) 12.0 - 15.0 g/dL   HCT 33.1 (L) 36.0 - 46.0 %   MCV 87.6 78.0 - 100.0 fL   MCH 26.5 26.0 - 34.0 pg   MCHC 30.2 30.0 - 36.0 g/dL   RDW 13.5 11.5 - 15.5 %   Platelets 296 150 - 400 K/uL    Comment: Performed at [facility], [place] [address], [place], [place] [address]  Glucose, capillary     Status: None    Collection Time: 07/15/18  7:35 AM  Result Value Ref Range   Glucose-Capillary 73 70 - 99 mg/dL   Comment 1 Capillary Specimen    Comment 2 Notify RN   Glucose, capillary     Status: Abnormal   Collection Time: 07/15/18  8:54 AM  Result Value Ref Range   Glucose-Capillary 101 (H) 70 - 99 mg/dL  Renal function panel     Status: Abnormal   Collection Time: 07/15/18 10:42 AM  Result Value Ref Range   Sodium 142 135 - 145 mmol/L   Potassium 2.7 (LL) 3.5 - 5.1 mmol/L    Comment: CRITICAL RESULT CALLED TO, READ BACK BY AND VERIFIED WITH: P.SHELTON,RN 07/15/18 1148 DAVISB    Chloride 115 (H) 98 - 111 mmol/L   CO2 24 22 - 32 mmol/L   Glucose, Bld 93 70 - 99 mg/dL   BUN <5 (L) 6 - 20 mg/dL   Creatinine, Ser 0.78 0.44 - 1.00 mg/dL   Calcium 8.1 (L) 8.9 - 10.3 mg/dL   Phosphorus 2.1 (L) 2.5 - 4.6 mg/dL   Albumin 2.5 (L) 3.5 -  5.0 g/dL   GFR calc non Af Amer >60 >60 mL/min   GFR calc Af Amer >60 >60 mL/min    Comment: (NOTE) The eGFR has been calculated using the CKD EPI equation. This calculation has not been validated in all clinical situations. eGFR's persistently <60 mL/min signify possible Chronic Kidney Disease.    Anion gap 3 (L) 5 - 15    Comment: Performed at [facility], [place] [address] [place], [place] [address]  Procalcitonin     Status: None   Collection Time: 07/15/18 10:42 AM  Result Value Ref Range   Procalcitonin <0.10 ng/mL    Comment:        Interpretation: PCT (Procalcitonin) <= 0.5 ng/mL: Systemic infection (sepsis) is not likely. Local bacterial infection is possible. (NOTE)       Sepsis PCT Algorithm           Lower Respiratory Tract                                      Infection PCT Algorithm    ----------------------------     ----------------------------         PCT < 0.25 ng/mL                PCT < 0.10 ng/mL         Strongly encourage             Strongly discourage   discontinuation of antibiotics    initiation of antibiotics     ----------------------------     -----------------------------       PCT 0.25 - 0.50 ng/mL            PCT 0.10 - 0.25 ng/mL               OR       >80% decrease in PCT            Discourage initiation of                                            antibiotics      Encourage discontinuation           of antibiotics    ----------------------------     -----------------------------         PCT >= 0.50 ng/mL              PCT 0.26 - 0.50 ng/mL               AND        <80% decrease in PCT             Encourage initiation of                                             antibiotics       Encourage continuation           of antibiotics    ----------------------------     -----------------------------        PCT >= 0.50 ng/mL                  PCT > 0.50 ng/mL  AND         increase in PCT                  Strongly encourage                                      initiation of antibiotics    Strongly encourage escalation           of antibiotics                                     -----------------------------                                           PCT <= 0.25 ng/mL                                                 OR                                        > 80% decrease in PCT                                     Discontinue / Do not initiate                                             antibiotics Performed at [facility], [address], [place], [place] [address]   CBC     Status: Abnormal   Collection Time: 07/15/18 10:45 AM  Result Value Ref Range   WBC 9.5 4.0 - 10.5 K/uL   RBC 3.77 (L) 3.87 - 5.11 MIL/uL   Hemoglobin 10.1 (L) 12.0 - 15.0 g/dL   HCT 33.0 (L) 36.0 - 46.0 %   MCV 87.5 78.0 - 100.0 fL   MCH 26.8 26.0 - 34.0 pg   MCHC 30.6 30.0 - 36.0 g/dL   RDW 13.5 11.5 - 15.5 %   Platelets 291 150 - 400 K/uL    Comment: Performed at [facility], [place] [address] [place], [place] [address]    Current Facility-Administered Medications  Medication Dose  Route Frequency Provider Last Rate Last Dose  . 0.9 %  sodium chloride infusion  250 mL Intravenous PRN Omar Person, NP      . 0.9 %  sodium chloride infusion   Intravenous Continuous Erick Colace, NP 250 mL/hr at 07/15/18 [phone]    . 0.9 %  sodium chloride infusion  100 mL Intravenous PRN Deterding, Jeneen Rinks, MD      . 0.9 %  sodium chloride infusion  100 mL Intravenous PRN Deterding, Jeneen Rinks, MD      . alteplase (CATHFLO ACTIVASE) injection 2 mg  2 mg Intracatheter Once PRN Mauricia Area, MD      .  Chlorhexidine Gluconate Cloth 2 % PADS 6 each  6 each Topical [id number] Mauricia Area, MD   6 each at 07/15/18 [phone]  . folic acid (FOLVITE) tablet 1 mg  1 mg Per Tube Daily Eudelia Bunch, RPH   1 mg at 07/15/18 0858  . heparin injection 1,000 Units  1,000 Units Dialysis PRN Deterding, Jeneen Rinks, MD      . heparin injection 14,100 Units  100 Units/kg Dialysis PRN Deterding, Jeneen Rinks, MD      . heparin injection 5,000 Units  5,000 Units Subcutaneous Q8H Omar Person, NP   5,000 Units at 07/15/18 [phone]  . insulin aspart (novoLOG) injection 0-15 Units  0-15 Units Subcutaneous Q4H Hayden Pedro M, NP      . levothyroxine (SYNTHROID, LEVOTHROID) tablet 50 mcg  50 mcg Per Tube QAC breakfast Erick Colace, NP   50 mcg at 07/15/18 0858  . lidocaine (PF) (XYLOCAINE) 1 % injection 5 mL  5 mL Intradermal PRN Deterding, Jeneen Rinks, MD      . lidocaine-prilocaine (EMLA) cream 1 application  1 application Topical PRN Deterding, Jeneen Rinks, MD      . pentafluoroprop-tetrafluoroeth (GEBAUERS) aerosol 1 application  1 application Topical PRN Deterding, Jeneen Rinks, MD      . thiamine (VITAMIN B-1) tablet 100 mg  100 mg Per Tube Daily Eudelia Bunch, RPH   100 mg at 07/15/18 1191    Musculoskeletal: Strength & Muscle Tone: within normal limits Gait & Station: UTA since sitting in bed while receiving dialysis. Patient leans: N/A  Psychiatric Specialty Exam: Physical Exam  Nursing note and vitals  reviewed. Constitutional: She is oriented to person, place, and time. She appears well-developed and well-nourished.  HENT:  Head: Normocephalic and atraumatic.  Neck: Normal range of motion.  Respiratory: Effort normal.  Musculoskeletal: Normal range of motion.  Neurological: She is alert and oriented to person, place, and time.  Skin: No rash noted.  Psychiatric: Her speech is normal and behavior is normal. Cognition and memory are normal. She expresses impulsivity. She exhibits a depressed mood. She expresses suicidal ideation. She expresses suicidal plans.    Review of Systems  Constitutional: Negative for chills and fever.  Respiratory: Positive for cough.   Cardiovascular: Positive for chest pain.  Gastrointestinal: Negative for abdominal pain, constipation, diarrhea, nausea and vomiting.  Psychiatric/Behavioral: Positive for depression, substance abuse and suicidal ideas. Negative for hallucinations. The patient does not have insomnia.   All other systems reviewed and are negative.   Blood pressure (!) 159/74, pulse 85, temperature 98.4 F (36.9 C), temperature source Oral, resp. rate (!) 29, height '5\' 7"'$  (1.702 m), weight (!) 141.5 kg (311 lb 15.2 oz), SpO2 100 %.Body mass index is 48.86 kg/m.  General Appearance: Fairly Groomed, young, morbidly obese, Caucasian female, wearing a hospital gown with red dyed hair who is lying in bed and receiving dialysis. NAD.   Eye Contact:  Good  Speech:  Clear and Coherent and Normal Rate  Volume:  Normal  Mood:  Depressed  Affect:  Constricted  Thought Process:  Goal Directed, Linear and Descriptions of Associations: Intact  Orientation:  Full (Time, Place, and Person)  Thought Content:  Logical  Suicidal Thoughts:  Yes.  with intent/plan  Homicidal Thoughts:  No  Memory:  Immediate;   Good Recent;   Good Remote;   Good  Judgement:  Poor  Insight:  Fair  Psychomotor Activity:  Normal  Concentration:  Concentration: Good and  Attention Span: Good  Recall:  Good  Fund of Knowledge:  Good  Language:  Good  Akathisia:  No  Handed:  Right  AIMS (if indicated):   N/A  Assets:  Communication Skills Desire for Improvement Financial Resources/Insurance Housing Intimacy Physical Health Social Support  ADL's:  Intact  Cognition:  WNL  Sleep:   Okay   Assessment:  Veronica Waters is a 27 y.o. female who was admitted with multiple drug overdose in the setting of stressors and alcohol use. She has a history of poor impulse control and multiple suicide attempts. She has a personality structure with poor coping skills. She warrants inpatient psychiatric hospitalization for stabilization and treatment. She may benefit from substance abuse treatment for episodic binge drinking and DBT for healthy coping skills to manage stressors. She reports learning DBT while completing a PHP in the past and found it effective for improving her mood.   Treatment Plan Summary: -Patient warrants inpatient psychiatric hospitalization given high risk of harm to self. -Continue bedside sitter.  -Continue to hold home medications given suicide attempt by multiple drug overdose. Can be resumed once patient is medically stable.  -EKG reviewed and QTc 447 on 7/22. Please closely monitor when starting or increasing QTc prolonging agents.  -Please pursue involuntary commitment if patient refuses voluntary psychiatric hospitalization or attempts to leave the hospital.  -Will sign off on patient at this time. Please consult psychiatry again as needed.     Disposition: Recommend psychiatric Inpatient admission when medically cleared.  Faythe Dingwall, DO 07/15/2018 1:27 PM

## 2018-07-15 NOTE — Progress Notes (Signed)
[facility]  Medications were counted and documented and sent to pharmacy.  Copy of medication inventory is in the shadow chart.

## 2018-07-15 NOTE — Progress Notes (Signed)
PULMONARY / CRITICAL CARE MEDICINE   Name: Veronica Waters MRN: [id number] DOB: Jun 27, 1991    ADMISSION DATE:  07/14/2018 CONSULTATION DATE:  07/14/2018  REFERRING MD:  Dr. Read Drivers   CHIEF COMPLAINT:  OD  HISTORY OF PRESENT ILLNESS:   27 year old female with PMH of HTN, Asthma, Major Depressive Disorder   Presents to ED on 7/22 with Suicide Attempt with multiple medications. Boyfriend reports that round 9 pm patient reported taking Seroquel, naltrexone, lithium, propanol, fluoxetine, lisinopril. On arrival to ED patient unresponsive. Due to airway concerns intubated. PCCM asked to admit.   SUBJECTIVE Awake and alert, on 2L [place] Reports nonproductive cough, mild dyspnea Afebrile Wants to eat Urine output 4.5L last 24 hrs  INTERVAL 7/22 transferred to ICU intubated. Most recent QTc at 0130 451. Litium lvl 1.27 (increased from 0.88), APAP < 10,  7/22>> Extubated, transfer to [facility] for HD due to rising Lithium levels  VITAL SIGNS: BP (!) 146/71 (BP Location: Right Arm)   Pulse 93   Temp 99 F (37.2 C)   Resp (!) 28   Ht 5\' 7"  (1.702 m)   Wt (!) 311 lb 15.2 oz (141.5 kg)   SpO2 97%   BMI 48.86 kg/m   HEMODYNAMICS:    INTAKE / OUTPUT: I/O last 3 completed shifts: In: 5432.9 [I.V.:4432.5; IV Piggyback:1000.4] Out: 4870 [Urine:4870]  PHYSICAL EXAMINATION: General: 27 y.o. Obese female, sitting in bed, on 2L [place], in no acute distress  HEENT: Atraumatic, normocephalic, No JVD, MM moist/pink   Pulmonary: Clear diminished bilaterally, no wheezing, even, symmetrical Cardiac: RRR, s1s2, no M/R/G  Abdomen: Soft, obese, nontender, BS+ x4  Extremities: Warm/dry, No deformities, palpable pulses all extremities Neuro: Awake, A&O x4, cooperative, no focal deficits  LABS:  BMET Recent Labs  Lab 07/14/18 1101 07/14/18 1810 07/15/18 0326  NA 143 144 141  K 4.0 3.9 4.3  CL 114* 115* 114*  CO2 25 25 22   BUN 6 6 4*  CREATININE 0.68 0.71 0.70  GLUCOSE 109* 91 118*     Electrolytes Recent Labs  Lab 07/14/18 0522 07/14/18 1101 07/14/18 1810 07/15/18 0326  CALCIUM  --  8.4* 8.6* 8.3*  MG 2.2  --   --   --   PHOS 3.7  --   --  2.4*    CBC Recent Labs  Lab 07/14/18 0021 07/15/18 0400  WBC 7.4  7.3 9.3  HGB 11.2*  11.2* 10.0*  HCT 35.3*  34.6* 33.1*  PLT 383  359 296    Coag's No results for input(s): APTT, INR in the last 168 hours.  Sepsis Markers No results for input(s): LATICACIDVEN, PROCALCITON, O2SATVEN in the last 168 hours.  ABG Recent Labs  Lab 07/14/18 0220  PHART 7.315*  PCO2ART 41.0  PO2ART 260*    Liver Enzymes Recent Labs  Lab 07/14/18 0021 07/14/18 1101 07/15/18 0326  AST 17 27  --   ALT 22 34  --   ALKPHOS 67 67  --   BILITOT 0.2* 0.4  --   ALBUMIN 3.0* 3.1* 2.6*    Cardiac Enzymes No results for input(s): TROPONINI, PROBNP in the last 168 hours.  Glucose Recent Labs  Lab 07/14/18 1715 07/14/18 2001 07/14/18 2339 07/15/18 0230 07/15/18 0313 07/15/18 0735  GLUCAP 99 83 80 66* 112* 73    Imaging Dg Chest Port 1 View  Result Date: 07/15/2018 CLINICAL DATA:  Line placement EXAM: PORTABLE CHEST 1 VIEW COMPARISON:  07/14/2018 FINDINGS: A right central venous catheter has  been placed with tip over the cavoatrial junction region. No pneumothorax. Interval removal of endotracheal and enteric tubes. Cardiac enlargement. Mild perihilar infiltrates could represent edema or pneumonia. No blunting of costophrenic angles. Mediastinal contours appear intact. IMPRESSION: Right central venous catheter with tip over the cavoatrial junction region. Cardiac enlargement with mild perihilar infiltrates suggesting edema or pneumonia. Electronically Signed   By: Burman NievesWilliam  Stevens M.D.   On: 07/15/2018 05:56     STUDIES:  CXR 7/22 > Low lung volumes. Tip of the endotracheal tube overlies the proximal right mainstem bronchus. Esophageal tube tip is below the diaphragm but non included. Cardiomegaly with mild  central vascular congestion. No pleural effusion or pneumothorax CXR 7/23>> Cardiac enlargement with mild perihilar infiltrates suggesting edema or pneumonia.  CULTURES: None.   ANTIBIOTICS: None.   SIGNIFICANT EVENTS: 7/21 > Presents to ED  7/22>>Extubated, [facility]  LINES/TUBES: ETT 7/22 >> 7/22 R IJ HD catheter 7/23>>  DISCUSSION: 27 year old female with major depressive disorder presents after Suicide Attempt by OD of multiple medications.  Lithium levels peaked 2.7, [facility].   ASSESSMENT / PLAN:  Suicide Attempt with OD > Unknown Quantity of Medications H/O Major Depressive Disorder, ETOH Elevated serum Lithium Levels -UDS Negative  -ETOH 40  -Lithium Peak 2.7> 2.6> 1.99 Plan Poison control following, will continue to follow guidelines Continue NS @ 250 ml/hr Telemetry monitoring EKG q12h for assessment of QT/QTc interval Serum Lithium q4h Plan for HD 7/23 Provide supportive care Suicide sitter Psychiatry evaluation  Respiratory Insufficiency in setting of Acute Encephalopathy>>improved Mild perihilar infiltrates on CXR (? edema vs. PNA) -pcxr: Cardiac enlargement with mild perihilar infiltrates suggesting edema or pneumonia.  -No leukocytosis or fever Plan Extubated 7/22 Supplemental O2 to maintain O2 sats >92% Follow CXR Obtain PCT, if elevated will start empiric ABX Encourage pulmonary toilet Mobilize as able   Prolonged QTC   -Seroquel OD  -451  Plan EKG q12h  DM  Plan CBG's SSI Follow ICU hypo/hyperglycemia protocol  Hypothyroidism  Plan Continue synthroid  Anemia without signs of bleeding Plan Monitor for s/sx of bleeding Trend CBC Heparin SQ for VTE prophylaxis Transfuse for Hgb <7 Continue Folic acid & Thiamine   FAMILY  - Updates: No family at bedside during NP rounds 07/15/18.  Updated pt at bedside 07/15/18.   - Inter-disciplinary family meet or Palliative Care meeting due by:   07/21/2018  DVT prophylaxis: [place] heparin  SUP: N/a Diet: Diet, advance as tolerated  Activity: As tolerated Disposition : ICU, possible transfer to med/surg after HD  Pt is currently hemodynamically stable.  Consider transferring pt to med/surg unit after received HD 07/15/18.  Harlon DittyJeremiah Deretha Ertle, AGACNP-BC [facility]

## 2018-07-16 ENCOUNTER — Inpatient Hospital Stay (HOSPITAL_COMMUNITY): Payer: Self-pay

## 2018-07-16 ENCOUNTER — Other Ambulatory Visit: Payer: Self-pay

## 2018-07-16 DIAGNOSIS — E876 Hypokalemia: Secondary | ICD-10-CM

## 2018-07-16 DIAGNOSIS — I10 Essential (primary) hypertension: Secondary | ICD-10-CM

## 2018-07-16 LAB — HEPATITIS B SURFACE ANTIBODY,QUALITATIVE
HEP B S AB: NONREACTIVE
Hep B S Ab: NONREACTIVE

## 2018-07-16 LAB — ECHOCARDIOGRAM COMPLETE
Height: 67 in
Weight: 4948.89 oz

## 2018-07-16 LAB — BASIC METABOLIC PANEL
Anion gap: 7 (ref 5–15)
Anion gap: 9 (ref 5–15)
BUN: <5 mg/dL — ABNORMAL LOW (ref 6–20)
BUN: <5 mg/dL — ABNORMAL LOW (ref 6–20)
CHLORIDE: 108 mmol/L (ref 98–111)
CO2: 26 mmol/L (ref 22–32)
CO2: 25 mmol/L (ref 22–32)
CREATININE: 0.76 mg/dL (ref 0.44–1.00)
Calcium: 7.2 mg/dL — ABNORMAL LOW (ref 8.9–10.3)
Calcium: 8 mg/dL — ABNORMAL LOW (ref 8.9–10.3)
Chloride: 108 mmol/L (ref 98–111)
Creatinine, Ser: 0.9 mg/dL (ref 0.44–1.00)
GFR calc non Af Amer: >60 mL/min (ref >60)
GLUCOSE: 112 mg/dL — AB (ref 70–99)
Glucose, Bld: 99 mg/dL (ref 70–99)
POTASSIUM: 3.1 mmol/L — AB (ref 3.5–5.1)
POTASSIUM: 3.6 mmol/L (ref 3.5–5.1)
SODIUM: 141 mmol/L (ref 135–145)
Sodium: 142 mmol/L (ref 135–145)

## 2018-07-16 LAB — GLUCOSE, CAPILLARY
GLUCOSE-CAPILLARY: 91 mg/dL (ref 70–99)
GLUCOSE-CAPILLARY: 96 mg/dL (ref 70–99)
GLUCOSE-CAPILLARY: 92 mg/dL (ref 70–99)
Glucose-Capillary: 82 mg/dL (ref 70–99)
Glucose-Capillary: 97 mg/dL (ref 70–99)
Glucose-Capillary: 100 mg/dL — ABNORMAL HIGH (ref 70–99)

## 2018-07-16 LAB — HEPATITIS B CORE ANTIBODY, IGM: HEP B C IGM: NEGATIVE

## 2018-07-16 LAB — HEPATITIS B SURFACE ANTIGEN
HEP B S AG: NEGATIVE
Hepatitis B Surface Ag: NEGATIVE

## 2018-07-16 LAB — HCV COMMENT:

## 2018-07-16 LAB — HEPATITIS C ANTIBODY (REFLEX)

## 2018-07-16 LAB — LITHIUM LEVEL
LITHIUM LVL: 0.5 mmol/L — AB (ref 0.60–1.20)
Lithium Lvl: 0.62 mmol/L (ref 0.60–1.20)
Lithium Lvl: 0.59 mmol/L — ABNORMAL LOW (ref 0.60–1.20)

## 2018-07-16 LAB — PHOSPHORUS: Phosphorus: 3.4 mg/dL (ref 2.5–4.6)

## 2018-07-16 MED ORDER — ONDANSETRON HCL 4 MG/2ML IJ SOLN
4.0000 mg | Freq: Three times a day (TID) | INTRAMUSCULAR | Status: DC | PRN
  Administered 2018-07-16: 4 mg via INTRAVENOUS
  Filled 2018-07-16: qty 2

## 2018-07-16 MED ORDER — FUROSEMIDE 10 MG/ML IJ SOLN
20.0000 mg | Freq: Once | INTRAMUSCULAR | Status: AC
  Administered 2018-07-16: 20 mg via INTRAVENOUS

## 2018-07-16 MED ORDER — POTASSIUM CHLORIDE CRYS ER 20 MEQ PO TBCR
20.0000 meq | EXTENDED_RELEASE_TABLET | Freq: Once | ORAL | Status: AC
  Administered 2018-07-16: 20 meq via ORAL
  Filled 2018-07-16: qty 1

## 2018-07-16 MED ORDER — POTASSIUM CHLORIDE CRYS ER 20 MEQ PO TBCR
40.0000 meq | EXTENDED_RELEASE_TABLET | ORAL | Status: AC
  Administered 2018-07-16 (×2): 40 meq via ORAL
  Filled 2018-07-16 (×2): qty 2

## 2018-07-16 MED ORDER — FUROSEMIDE 10 MG/ML IJ SOLN
INTRAMUSCULAR | Status: AC
  Administered 2018-07-16: 20 mg via INTRAVENOUS
  Filled 2018-07-16: qty 2

## 2018-07-16 MED ORDER — MAGNESIUM SULFATE 2 GM/50ML IV SOLN
2.0000 g | Freq: Once | INTRAVENOUS | Status: AC
  Administered 2018-07-16: 2 g via INTRAVENOUS
  Filled 2018-07-16: qty 50

## 2018-07-16 NOTE — Progress Notes (Signed)
  Echocardiogram 2D Echocardiogram has been performed.  Veronica Waters 07/16/2018, 1:22 PM

## 2018-07-16 NOTE — Progress Notes (Signed)
[facility] PROGRESS NOTE  Veronica Waters ZOX:[id number] DOB: [birth year] DOA: 07/14/2018  PCP: Patient, No Pcp Per  Brief History/Interval Summary: 27 year old Caucasian female with a past medical history of hypertension asthma and depressive disorder presented with suicidal attempts with multiple medications.  Apparently she was found to have Seroquel naltrexone lithium propranolol fluoxetine and lisinopril.  Patient had to be intubated to protect her airway.  She was admitted to the intensive care unit.  Subsequently extubated.  She also underwent dialysis for lithium toxicity.  Reason for Visit: Suicidal attempt.  Intentional drug overdose.  Lithium toxicity  Consultants: Critical care medicine.  Psychiatry.  Nephrology  Procedures: Dialysis  Antibiotics: None  Subjective/Interval History: Patient complains of some shortness of breath.  Complains of cough with yellowish expectoration.  Denies any chest pain.  No nausea or vomiting.  ROS: Denies any headaches  Objective:  Vital Signs  Vitals:   07/16/18 0600 07/16/18 0700 07/16/18 0745 07/16/18 0800  BP: 122/65 118/71 118/71 123/68  Pulse: 76 78 80 78  Resp: (!) 32 (!) 23 19 (!) 29  Temp: 99 F (37.2 C) 98.6 F (37 C) 98.8 F (37.1 C) 98.6 F (37 C)  TempSrc:   Oral   SpO2: 94% 94% 99% 97%  Weight:      Height:        Intake/Output Summary (Last 24 hours) at 07/16/2018 1100 Last data filed at 07/16/2018 1045 Gross per 24 hour  Intake 6747 ml  Output 6795 ml  Net -48 ml   Filed Weights   07/15/18 0945 07/15/18 1545 07/16/18 0459  Weight: (!) 141.5 kg (311 lb 15.2 oz) (!) 141.8 kg (312 lb 9.8 oz) (!) 140.3 kg (309 lb 4.9 oz)    General appearance: alert, cooperative, appears stated age and no distress Head: Normocephalic, without obvious abnormality, atraumatic Resp: clear to auscultation bilaterally Cardio: regular rate and rhythm, S1, S2 normal, no murmur, click, rub or gallop GI: soft, non-tender;  bowel sounds normal; no masses,  no organomegaly Extremities: extremities normal, atraumatic, no cyanosis or edema Pulses: 2+ and symmetric Neurologic: Awake.  Alert and oriented.  No obvious focal neurological deficits.  Moving all her extremities.  Lab Results:  Data Reviewed: I have personally reviewed following labs and imaging studies  CBC: Recent Labs  Lab 07/14/18 0021 07/15/18 0400 07/15/18 1045  WBC 7.4  7.3 9.3 9.5  NEUTROABS 4.5  --   --   HGB 11.2*  11.2* 10.0* 10.1*  HCT 35.3*  34.6* 33.1* 33.0*  MCV 85.9  85.9 87.6 87.5  PLT 383  359 296 291    Basic Metabolic Panel: Recent Labs  Lab 07/14/18 0522  07/14/18 1810 07/15/18 0326 07/15/18 1042 07/15/18 1802 07/16/18 0630  NA  --    < > 144 141 142 140 141  K  --    < > 3.9 4.3 2.7* 2.9* 3.1*  CL  --    < > 115* 114* 115* 105 108  CO2  --    < > 25 22 24 27 26   GLUCOSE  --    < > 91 118* 93 100* 99  BUN  --    < > 6 4* <5* <5* <5*  CREATININE  --    < > 0.71 0.70 0.78 0.57 0.76  CALCIUM  --    < > 8.6* 8.3* 8.1* 7.5* 7.2*  MG 2.2  --   --   --   --  1.6*  --  PHOS 3.7  --   --  2.4* 2.1* 1.6* 3.4   < > = values in this interval not displayed.    GFR: Estimated Creatinine Clearance: 156.6 mL/min (by C-G formula based on SCr of 0.76 mg/dL).  Liver Function Tests: Recent Labs  Lab 07/14/18 0021 07/14/18 1101 07/15/18 0326 07/15/18 1042  AST 17 27  --   --   ALT 22 34  --   --   ALKPHOS 67 67  --   --   BILITOT 0.2* 0.4  --   --   PROT 6.8 7.1  --   --   ALBUMIN 3.0* 3.1* 2.6* 2.5*    CBG: Recent Labs  Lab 07/15/18 1720 07/15/18 2008 07/15/18 2332 07/16/18 0356 07/16/18 0746  GLUCAP 89 102* 95 97 91    Thyroid Function Tests: Recent Labs    07/14/18 1101  TSH 1.730     Recent Results (from the past 240 hour(s))  MRSA PCR Screening     Status: None   Collection Time: 07/14/18  3:30 AM  Result Value Ref Range Status   MRSA by PCR NEGATIVE NEGATIVE Final    Comment:          The GeneXpert MRSA Assay (FDA approved for NASAL specimens only), is one component of a comprehensive MRSA colonization surveillance program. It is not intended to diagnose MRSA infection nor to guide or monitor treatment for MRSA infections. Performed at [facility], [address] [place], [place] [address]       Radiology Studies: Dg Chest 2 View  Result Date: 07/16/2018 CLINICAL DATA:  Acute respiratory failure, shortness of breath EXAM: CHEST - 2 VIEW COMPARISON:  07/15/2018 FINDINGS: Right dialysis catheter remains in place, unchanged. Cardiomegaly with vascular congestion and mild interstitial prominence and perihilar opacities which likely reflects early edema. Small bilateral effusions. IMPRESSION: Cardiomegaly with vascular congestion. Suspect early edema. Small effusions. Electronically Signed   By: Charlett Nose M.D.   On: 07/16/2018 07:22   Dg Chest Port 1 View  Result Date: 07/15/2018 CLINICAL DATA:  Line placement EXAM: PORTABLE CHEST 1 VIEW COMPARISON:  07/14/2018 FINDINGS: A right central venous catheter has been placed with tip over the cavoatrial junction region. No pneumothorax. Interval removal of endotracheal and enteric tubes. Cardiac enlargement. Mild perihilar infiltrates could represent edema or pneumonia. No blunting of costophrenic angles. Mediastinal contours appear intact. IMPRESSION: Right central venous catheter with tip over the cavoatrial junction region. Cardiac enlargement with mild perihilar infiltrates suggesting edema or pneumonia. Electronically Signed   By: Burman Nieves M.D.   On: 07/15/2018 05:56     Medications:  Scheduled: . Chlorhexidine Gluconate Cloth  6 each Topical Q0600  . folic acid  1 mg Per Tube Daily  . heparin  5,000 Units Subcutaneous Q8H  . insulin aspart  0-15 Units Subcutaneous Q4H  . levothyroxine  50 mcg Per Tube QAC breakfast  . potassium chloride  40 mEq Oral Q4H  . thiamine  100 mg Per Tube  Daily   Continuous: . sodium chloride    . sodium chloride     XBJ:YNWGNF chloride, sodium chloride, alteplase, heparin, heparin, lidocaine (PF), lidocaine-prilocaine, ondansetron, pentafluoroprop-tetrafluoroeth  Assessment/Plan:    Suicidal attempt with multiple drug overdose She took an unknown quantity as well as unknown types of medications.  She was found to have multiple medication bottles next to her.  She was however found to have elevated serum lithium levels.  See below.  Seen by  psychiatry who recommends inpatient psychiatric hospitalizations once medically stable.  Corporate investment bankerContinue sitter.  Intentional drug overdose with lithium toxicity Patient seen by nephrology.  Underwent hemodialysis.  Lithium levels have been normal the last few times they were checked.  No further dialysis per nephrology.  Dialysis catheter to be removed today.  Acute respiratory failure This was in the second of drug overdose and inability to protect airway in the setting of encephalopathy.  She was in the ICU.  She was successfully extubated.  She does complain of some shortness of breath this morning.  X-ray suggests some pulmonary edema.  No infiltrates noted.  She is afebrile.  She was aggressively hydrated for her lithium toxicity.  She will be given a dose of Lasix today.  Continue to wean off oxygen.  Prolonged QT interval She was on Seroquel and likely overdosed on same.  Stable currently.  QTc 468 this morning  Diabetes mellitus type 2 Monitor CBGs.  Continue SSI.  Hypothyroidism Continue Synthroid.  Normocytic anemia Hemoglobin has been stable.  Hypokalemia and hypomagnesemia This will be repleted aggressively.  Recheck labs later today.  Magnesium level was noted to be 1.6 yesterday evening.  Does not look like this was repleted.  We will order magnesium sulfate this morning.  Recheck levels tomorrow.   DVT Prophylaxis: Subcutaneous heparin    Code Status: Full code Family Communication:  Discussed with the patient Disposition Plan: Management as outlined above.  Okay for transfer to floor.  Should be able to remove Foley catheter later today.    LOS: 2 days   Osvaldo ShipperGokul Elice Crigger  [facility] Pager [phone] 07/16/2018, 11:00 AM  If 7PM-7AM, please contact night-coverage at [email or web address], password [email or web address]

## 2018-07-16 NOTE — Progress Notes (Signed)
Admission note:  Arrival Method: Patient arrived from 40M. Mental Orientation:  Alert and oriented x 4. Telemetry: 69M-03, NSR Assessment: See doc flow sheets Skin: Intact. IV: Right upper arm saline lock. Pain: Denies any pain. Tubes: Foley but will be discontinued. Safety Measures:  Bed in low position, suicide sitter at bedside.   Fall Prevention Safety Plan:   Reviewed the plan, verbalized understanding. Admission Screening: Completed. 6700 Orientation: Patient has been oriented to the unit, staff and to the room.

## 2018-07-16 NOTE — Progress Notes (Signed)
[name]   Subjective:   Improved  Good urine output and has lithium level that has decreased  Serum creatinine is good and vitals stable    Oxygen sats 100 % room air no respiratory distress  Objective:  Vital signs in last 24 hours:  Temp:  [97.8 F (36.6 C)-100.8 F (38.2 C)] 98.8 F (37.1 C) (07/24 0745) Pulse Rate:  [76-91] 80 (07/24 0745) Resp:  [19-37] 19 (07/24 0745) BP: (107-172)/(56-91) 118/71 (07/24 0745) SpO2:  [91 %-100 %] 99 % (07/24 0745) Weight:  [309 lb 4.9 oz (140.3 kg)-312 lb 9.8 oz (141.8 kg)] 309 lb 4.9 oz (140.3 kg) (07/24 0459)  Weight change: 0 lb (0 kg) Filed Weights   07/15/18 0945 07/15/18 1545 07/16/18 0459  Weight: (!) 311 lb 15.2 oz (141.5 kg) (!) 312 lb 9.8 oz (141.8 kg) (!) 309 lb 4.9 oz (140.3 kg)    Intake/Output: I/O last 3 completed shifts: In: 9237 [P.O.:480; I.V.:8250; IV Piggyback:507] Out: 7190 [Urine:7690]   Intake/Output this shift:  No intake/output data recorded.  CVS- RRR RS- CTA ABD- BS present soft non-distended EXT- no edema   Basic Metabolic Panel: Recent Labs  Lab 07/14/18 0522  07/14/18 1810 07/15/18 0326 07/15/18 1042 07/15/18 1802 07/16/18 0630  NA  --    < > 144 141 142 140 141  K  --    < > 3.9 4.3 2.7* 2.9* 3.1*  CL  --    < > 115* 114* 115* 105 108  CO2  --    < > 25 22 24 27 26   GLUCOSE  --    < > 91 118* 93 100* 99  BUN  --    < > 6 4* <5* <5* <5*  CREATININE  --    < > 0.71 0.70 0.78 0.57 0.76  CALCIUM  --    < > 8.6* 8.3* 8.1* 7.5* 7.2*  MG 2.2  --   --   --   --  1.6*  --   PHOS 3.7  --   --  2.4* 2.1* 1.6*  --    < > = values in this interval not displayed.    Liver Function Tests: Recent Labs  Lab 07/14/18 0021 07/14/18 1101 07/15/18 0326 07/15/18 1042  AST 17 27  --   --   ALT 22 34  --   --   ALKPHOS 67 67  --   --   BILITOT 0.2* 0.4  --   --   PROT 6.8 7.1  --   --   ALBUMIN 3.0* 3.1* 2.6* 2.5*   No results for input(s): LIPASE, AMYLASE in the  last 168 hours. No results for input(s): AMMONIA in the last 168 hours.  CBC: Recent Labs  Lab 07/14/18 0021 07/15/18 0400 07/15/18 1045  WBC 7.4  7.3 9.3 9.5  NEUTROABS 4.5  --   --   HGB 11.2*  11.2* 10.0* 10.1*  HCT 35.3*  34.6* 33.1* 33.0*  MCV 85.9  85.9 87.6 87.5  PLT 383  359 296 291    Cardiac Enzymes: No results for input(s): CKTOTAL, CKMB, CKMBINDEX, TROPONINI in the last 168 hours.  BNP: Invalid input(s): POCBNP  CBG: Recent Labs  Lab 07/15/18 0854 07/15/18 1720 07/15/18 2008 07/15/18 2332 07/16/18 0746  GLUCAP 101* 89 102* 95 91    Microbiology: Results for orders placed or performed during the hospital encounter of 07/14/18  MRSA PCR Screening     Status:  None   Collection Time: 07/14/18  3:30 AM  Result Value Ref Range Status   MRSA by PCR NEGATIVE NEGATIVE Final    Comment:        The GeneXpert MRSA Assay (FDA approved for NASAL specimens only), is one component of a comprehensive MRSA colonization surveillance program. It is not intended to diagnose MRSA infection nor to guide or monitor treatment for MRSA infections. Performed at [facility], [address] [place], [place] [address]     Coagulation Studies: No results for input(s): LABPROT, INR in the last 72 hours.  Urinalysis: No results for input(s): COLORURINE, LABSPEC, PHURINE, GLUCOSEU, HGBUR, BILIRUBINUR, KETONESUR, PROTEINUR, UROBILINOGEN, NITRITE, LEUKOCYTESUR in the last 72 hours.  Invalid input(s): APPERANCEUR    Imaging: Dg Chest 2 View  Result Date: 07/16/2018 CLINICAL DATA:  Acute respiratory failure, shortness of breath EXAM: CHEST - 2 VIEW COMPARISON:  07/15/2018 FINDINGS: Right dialysis catheter remains in place, unchanged. Cardiomegaly with vascular congestion and mild interstitial prominence and perihilar opacities which likely reflects early edema. Small bilateral effusions. IMPRESSION: Cardiomegaly with vascular congestion. Suspect early  edema. Small effusions. Electronically Signed   By: Charlett NoseKevin  Dover M.D.   On: 07/16/2018 07:22   Dg Chest Port 1 View  Result Date: 07/15/2018 CLINICAL DATA:  Line placement EXAM: PORTABLE CHEST 1 VIEW COMPARISON:  07/14/2018 FINDINGS: A right central venous catheter has been placed with tip over the cavoatrial junction region. No pneumothorax. Interval removal of endotracheal and enteric tubes. Cardiac enlargement. Mild perihilar infiltrates could represent edema or pneumonia. No blunting of costophrenic angles. Mediastinal contours appear intact. IMPRESSION: Right central venous catheter with tip over the cavoatrial junction region. Cardiac enlargement with mild perihilar infiltrates suggesting edema or pneumonia. Electronically Signed   By: Burman NievesWilliam  Stevens M.D.   On: 07/15/2018 05:56     Medications:   . sodium chloride    . sodium chloride 250 mL/hr (07/16/18 0459)  . sodium chloride    . sodium chloride     . Chlorhexidine Gluconate Cloth  6 each Topical Q0600  . folic acid  1 mg Per Tube Daily  . heparin  5,000 Units Subcutaneous Q8H  . insulin aspart  0-15 Units Subcutaneous Q4H  . levothyroxine  50 mcg Per Tube QAC breakfast  . potassium chloride  40 mEq Oral BID  . thiamine  100 mg Per Tube Daily   sodium chloride, sodium chloride, sodium chloride, alteplase, heparin, heparin, lidocaine (PF), lidocaine-prilocaine, ondansetron, pentafluoroprop-tetrafluoroeth  Assessment/ Plan:   1. Li overdose   Levels now falling well  No issues and no requirements for additional dialysis   Remove dialysis catheter  2. OD  Sitter at bedside 3. Hypertension  Controlled 4. Diabetes as per primary team  Appears that dialysis has helped decrease the lithium level and treated the symptoms of overdose    LOS: 2 Veronica Waters W @TODAY @8 :03 AM

## 2018-07-17 ENCOUNTER — Encounter (HOSPITAL_COMMUNITY): Payer: Self-pay | Admitting: *Deleted

## 2018-07-17 ENCOUNTER — Other Ambulatory Visit: Payer: Self-pay

## 2018-07-17 ENCOUNTER — Inpatient Hospital Stay (HOSPITAL_COMMUNITY)
Admission: AD | Admit: 2018-07-17 | Discharge: 2018-07-28 | DRG: 885 | Disposition: A | Discharge status: Home / Self Care | Payer: Federal, State, Local not specified - Other | Source: Intra-hospital | Attending: Psychiatry | Admitting: Psychiatry

## 2018-07-17 DIAGNOSIS — E119 Type 2 diabetes mellitus without complications: Secondary | ICD-10-CM | POA: Diagnosis present

## 2018-07-17 DIAGNOSIS — Z6841 Body Mass Index (BMI) 40.0 and over, adult: Secondary | ICD-10-CM

## 2018-07-17 DIAGNOSIS — E669 Obesity, unspecified: Secondary | ICD-10-CM | POA: Diagnosis present

## 2018-07-17 DIAGNOSIS — F4310 Post-traumatic stress disorder, unspecified: Secondary | ICD-10-CM | POA: Diagnosis present

## 2018-07-17 DIAGNOSIS — Z885 Allergy status to narcotic agent status: Secondary | ICD-10-CM

## 2018-07-17 DIAGNOSIS — Z915 Personal history of self-harm: Secondary | ICD-10-CM

## 2018-07-17 DIAGNOSIS — Z886 Allergy status to analgesic agent status: Secondary | ICD-10-CM

## 2018-07-17 DIAGNOSIS — F603 Borderline personality disorder: Secondary | ICD-10-CM | POA: Diagnosis present

## 2018-07-17 DIAGNOSIS — I10 Essential (primary) hypertension: Secondary | ICD-10-CM | POA: Diagnosis present

## 2018-07-17 DIAGNOSIS — F319 Bipolar disorder, unspecified: Secondary | ICD-10-CM | POA: Diagnosis present

## 2018-07-17 DIAGNOSIS — F3163 Bipolar disorder, current episode mixed, severe, without psychotic features: Secondary | ICD-10-CM | POA: Diagnosis present

## 2018-07-17 DIAGNOSIS — E039 Hypothyroidism, unspecified: Secondary | ICD-10-CM | POA: Diagnosis present

## 2018-07-17 DIAGNOSIS — Z91018 Allergy to other foods: Secondary | ICD-10-CM

## 2018-07-17 DIAGNOSIS — Z62810 Personal history of physical and sexual abuse in childhood: Secondary | ICD-10-CM | POA: Diagnosis present

## 2018-07-17 DIAGNOSIS — Z7984 Long term (current) use of oral hypoglycemic drugs: Secondary | ICD-10-CM | POA: Diagnosis not present

## 2018-07-17 DIAGNOSIS — F314 Bipolar disorder, current episode depressed, severe, without psychotic features: Secondary | ICD-10-CM | POA: Diagnosis not present

## 2018-07-17 DIAGNOSIS — Z79899 Other long term (current) drug therapy: Secondary | ICD-10-CM

## 2018-07-17 DIAGNOSIS — Z79890 Hormone replacement therapy (postmenopausal): Secondary | ICD-10-CM

## 2018-07-17 DIAGNOSIS — R600 Localized edema: Secondary | ICD-10-CM

## 2018-07-17 DIAGNOSIS — Z818 Family history of other mental and behavioral disorders: Secondary | ICD-10-CM

## 2018-07-17 LAB — GLUCOSE, CAPILLARY
GLUCOSE-CAPILLARY: 81 mg/dL (ref 70–99)
GLUCOSE-CAPILLARY: 81 mg/dL (ref 70–99)
GLUCOSE-CAPILLARY: 83 mg/dL (ref 70–99)
GLUCOSE-CAPILLARY: 87 mg/dL (ref 70–99)
Glucose-Capillary: 92 mg/dL (ref 70–99)
Glucose-Capillary: 101 mg/dL — ABNORMAL HIGH (ref 70–99)

## 2018-07-17 LAB — BASIC METABOLIC PANEL
Anion gap: 10 (ref 5–15)
BUN: <5 mg/dL — ABNORMAL LOW (ref 6–20)
CHLORIDE: 108 mmol/L (ref 98–111)
CO2: 22 mmol/L (ref 22–32)
CREATININE: 0.66 mg/dL (ref 0.44–1.00)
Calcium: 8.2 mg/dL — ABNORMAL LOW (ref 8.9–10.3)
GFR calc Af Amer: >60 mL/min (ref >60)
Glucose, Bld: 94 mg/dL (ref 70–99)
Potassium: 4.1 mmol/L (ref 3.5–5.1)
SODIUM: 140 mmol/L (ref 135–145)

## 2018-07-17 LAB — CBC
HEMATOCRIT: 32.7 % — AB (ref 36.0–46.0)
Hemoglobin: 10.4 g/dL — ABNORMAL LOW (ref 12.0–15.0)
MCH: 26.7 pg (ref 26.0–34.0)
MCHC: 31.8 g/dL (ref 30.0–36.0)
MCV: 83.8 fL (ref 78.0–100.0)
PLATELETS: 345 10*3/uL (ref 150–400)
RBC: 3.9 MIL/uL (ref 3.87–5.11)
RDW: 13.6 % (ref 11.5–15.5)
WBC: 8.8 10*3/uL (ref 4.0–10.5)

## 2018-07-17 LAB — MAGNESIUM: Magnesium: 2.2 mg/dL (ref 1.7–2.4)

## 2018-07-17 MED ORDER — VITAMIN B-1 100 MG PO TABS
100.0000 mg | ORAL_TABLET | Freq: Every day | ORAL | Status: DC
  Administered 2018-07-17: 100 mg via ORAL

## 2018-07-17 MED ORDER — LEVOTHYROXINE SODIUM 50 MCG PO TABS
50.0000 ug | ORAL_TABLET | Freq: Every day | ORAL | Status: DC
  Administered 2018-07-18 – 2018-07-28 (×10): 50 ug via ORAL
  Filled 2018-07-17: qty 1
  Filled 2018-07-17: qty 2
  Filled 2018-07-17 (×7): qty 1
  Filled 2018-07-17: qty 2
  Filled 2018-07-17 (×4): qty 1

## 2018-07-17 MED ORDER — INSULIN ASPART 100 UNIT/ML ~~LOC~~ SOLN: 0.0000 [IU] | SUBCUTANEOUS | Status: DC

## 2018-07-17 MED ORDER — FUROSEMIDE 10 MG/ML IJ SOLN
20.0000 mg | Freq: Once | INTRAMUSCULAR | Status: AC
  Administered 2018-07-17: 20 mg via INTRAVENOUS
  Filled 2018-07-17: qty 2

## 2018-07-17 MED ORDER — FOLIC ACID 1 MG PO TABS
1.0000 mg | ORAL_TABLET | Freq: Every day | ORAL | Status: DC
  Administered 2018-07-18 – 2018-07-28 (×10): 1 mg via ORAL
  Filled 2018-07-17 (×13): qty 1

## 2018-07-17 MED ORDER — LEVOTHYROXINE SODIUM 50 MCG PO TABS: 50.0000 ug | ORAL_TABLET | Freq: Every day | ORAL | Status: DC

## 2018-07-17 MED ORDER — FUROSEMIDE 20 MG PO TABS
20.0000 mg | ORAL_TABLET | Freq: Every day | ORAL | Status: AC
  Administered 2018-07-18 – 2018-07-20 (×3): 20 mg via ORAL
  Filled 2018-07-17 (×4): qty 1

## 2018-07-17 MED ORDER — THIAMINE HCL 100 MG PO TABS: 100.0000 mg | ORAL_TABLET | Freq: Every day | ORAL | Status: DC

## 2018-07-17 MED ORDER — INSULIN ASPART 100 UNIT/ML ~~LOC~~ SOLN: 0.0000 [IU] | Freq: Three times a day (TID) | SUBCUTANEOUS | Status: DC

## 2018-07-17 MED ORDER — FOLIC ACID 1 MG PO TABS: 1.0000 mg | ORAL_TABLET | Freq: Every day | ORAL | Status: DC

## 2018-07-17 MED ORDER — FUROSEMIDE 20 MG PO TABS: 20.0000 mg | ORAL_TABLET | Freq: Every day | ORAL | Status: DC

## 2018-07-17 MED ORDER — QUETIAPINE FUMARATE 25 MG PO TABS
25.0000 mg | ORAL_TABLET | Freq: Once | ORAL | Status: AC | PRN
  Administered 2018-07-17: 25 mg via ORAL
  Filled 2018-07-17: qty 1

## 2018-07-17 MED ORDER — POTASSIUM CHLORIDE CRYS ER 20 MEQ PO TBCR
20.0000 meq | EXTENDED_RELEASE_TABLET | Freq: Every day | ORAL | Status: DC
  Filled 2018-07-17 (×2): qty 1

## 2018-07-17 MED ORDER — FUROSEMIDE 40 MG PO TABS: 20.0000 mg | ORAL_TABLET | Freq: Every day | ORAL | Status: DC

## 2018-07-17 MED ORDER — POTASSIUM CHLORIDE CRYS ER 20 MEQ PO TBCR: 20.0000 meq | EXTENDED_RELEASE_TABLET | Freq: Every day | ORAL | Status: DC

## 2018-07-17 MED ORDER — FOLIC ACID 1 MG PO TABS
1.0000 mg | ORAL_TABLET | Freq: Every day | ORAL | Status: DC
  Administered 2018-07-17: 1 mg via ORAL

## 2018-07-17 MED ORDER — LISINOPRIL 10 MG PO TABS
10.0000 mg | ORAL_TABLET | Freq: Every day | ORAL | Status: DC
  Administered 2018-07-18 – 2018-07-28 (×9): 10 mg via ORAL
  Filled 2018-07-17: qty 1
  Filled 2018-07-17: qty 2
  Filled 2018-07-17 (×11): qty 1

## 2018-07-17 MED ORDER — POTASSIUM CHLORIDE CRYS ER 20 MEQ PO TBCR
20.0000 meq | EXTENDED_RELEASE_TABLET | Freq: Once | ORAL | Status: AC
  Administered 2018-07-17: 20 meq via ORAL
  Filled 2018-07-17: qty 1

## 2018-07-17 MED ORDER — POTASSIUM CHLORIDE CRYS ER 20 MEQ PO TBCR: 20.0000 meq | EXTENDED_RELEASE_TABLET | Freq: Every day | ORAL | 0 refills | Status: DC

## 2018-07-17 MED ORDER — VITAMIN B-1 100 MG PO TABS
100.0000 mg | ORAL_TABLET | Freq: Every day | ORAL | Status: DC
  Administered 2018-07-18 – 2018-07-28 (×10): 100 mg via ORAL
  Filled 2018-07-17 (×13): qty 1

## 2018-07-17 MED ORDER — HEPARIN SODIUM (PORCINE) 5000 UNIT/ML IJ SOLN
5000.0000 [IU] | Freq: Three times a day (TID) | INTRAMUSCULAR | Status: DC
  Filled 2018-07-17 (×2): qty 1

## 2018-07-17 NOTE — Progress Notes (Signed)
Veronica Waters is a 27 year old female pt admitted on voluntary basis from medical floor after overdose on lithium. On admission she does endorse depression and passive SI but able to contract for safety on the unit. She reports that she is feeling better and spoke about how she was just feeling stressed when she took the overdose. She denies any substance abuse issues. She reports that she was taking her medications as prescribed. She reports that she has her own place and reports that her boyfriend is living there with her and reports that she will go back there after discharge. Veronica Waters was admitted to the unit and safety maintained.

## 2018-07-17 NOTE — Progress Notes (Signed)
Report to called to Warnell BureauEizabeth, Charity fundraiserN at behavioral health. [facility] transporter here to transport patient. Dondra SpryMoore, Kadince Boxley Islee, RN

## 2018-07-17 NOTE — Discharge Summary (Signed)
[facility]  Physician Discharge Summary   Patient ID: Veronica Waters MRN: [id number] DOB/AGE: Oct 20, 1991 27 y.o.  Admit date: 07/14/2018 Discharge date: 07/17/2018  PCP: Patient, No Pcp Per  PATIENT BEING DISCHARGED/TRANSFERRED TO [facility]  DISCHARGE DIAGNOSES:  Suicidal attempt by intentional drug overdose  RECOMMENDATIONS FOR OUTPATIENT FOLLOW UP: 1. Lasix and potassium for 3 more days starting 7/26  DISCHARGE CONDITION: fair  Diet recommendation: Modified carbohydrate  Filed Weights   07/15/18 1545 07/16/18 0459 07/16/18 2036  Weight: (!) 141.8 kg (312 lb 9.8 oz) (!) 140.3 kg (309 lb 4.9 oz) (!) 140.3 kg (309 lb 4.8 oz)    INITIAL HISTORY: 27 year old Caucasian female with a past medical history of hypertension asthma and depressive disorder presented with suicidal attempts with multiple medications.  Apparently she was found to have Seroquel naltrexone lithium propranolol fluoxetine and lisinopril.  Patient had to be intubated to protect her airway.  She was admitted to the intensive care unit.  Subsequently extubated.  She also underwent dialysis for lithium toxicity.  Consultants: Critical care medicine.  Psychiatry.  Nephrology  Procedures: Dialysis   HOSPITAL COURSE:   Suicidal attempt with multiple drug overdose She took an unknown quantity as well as unknown types of medications.  She was found to have multiple medication bottles next to her.  She was however found to have elevated serum lithium levels.  See below.  Seen by psychiatry who recommends inpatient psychiatric hospitalizations once medically stable.  Corporate investment banker.  Patient is medically stable today.  Will defer initiation of all her psychotropic medications to inpatient psychiatry.  Intentional drug overdose with lithium toxicity Patient seen by nephrology.  Underwent hemodialysis.  Lithium levels have normalized.  No further dialysis.  Dialysis catheter was removed on  7/24.   Acute respiratory failure This was in the setting of drug overdose and inability to protect airway in the setting of encephalopathy.  She was in the ICU.  She was successfully extubated.  As of breath on 7/24.  Chest x-ray suggested mild coronary edema.  She did get a lot of fluids during her stay in the ICU.  She was given a dose of Lasix yesterday with good diuresis and improvement in her symptoms.  She was given an additional dose of IV Lasix today and will need oral doses of Lasix for 3 more days from tomorrow.  She is now saturating normal on room air.  Prolonged QT interval She was on Seroquel and likely overdosed on same.  Stable currently.  QTc has normalized.  Diabetes mellitus type 2 Monitor CBGs.  Continue SSI.  Can resume home medications.  Hypothyroidism Continue Synthroid.  Normocytic anemia Hemoglobin has been stable.  No evidence for bleeding.  Hypokalemia and hypomagnesemia This has been repleted.  Levels are normal.  Supplement potassium while she is taking Lasix.  Magnesium is normal as well.    Stable.  Patient has been accepted at the [facility] behavioral [facility].  Okay for discharge/transfer to that facility today.    PERTINENT LABS:  The results of significant diagnostics from this hospitalization (including imaging, microbiology, ancillary and laboratory) are listed below for reference.    Microbiology: Recent Results (from the past 240 hour(s))  MRSA PCR Screening     Status: None   Collection Time: 07/14/18  3:30 AM  Result Value Ref Range Status   MRSA by PCR NEGATIVE NEGATIVE Final    Comment:        The GeneXpert MRSA Assay (FDA  approved for NASAL specimens only), is one component of a comprehensive MRSA colonization surveillance program. It is not intended to diagnose MRSA infection nor to guide or monitor treatment for MRSA infections. Performed at [facility], [address] [place], [place] [address]        Labs: Basic Metabolic Panel: Recent Labs  Lab 07/14/18 0522  07/15/18 0326 07/15/18 1042 07/15/18 1802 07/16/18 0630 07/16/18 1530 07/17/18 0406  NA  --    < > 141 142 140 141 142 140  K  --    < > 4.3 2.7* 2.9* 3.1* 3.6 4.1  CL  --    < > 114* 115* 105 108 108 108  CO2  --    < > 22 24 27 26 25 22   GLUCOSE  --    < > 118* 93 100* 99 112* 94  BUN  --    < > 4* <5* <5* <5* <5* <5*  CREATININE  --    < > 0.70 0.78 0.57 0.76 0.90 0.66  CALCIUM  --    < > 8.3* 8.1* 7.5* 7.2* 8.0* 8.2*  MG 2.2  --   --   --  1.6*  --   --  2.2  PHOS 3.7  --  2.4* 2.1* 1.6* 3.4  --   --    < > = values in this interval not displayed.   Liver Function Tests: Recent Labs  Lab 07/14/18 0021 07/14/18 1101 07/15/18 0326 07/15/18 1042  AST 17 27  --   --   ALT 22 34  --   --   ALKPHOS 67 67  --   --   BILITOT 0.2* 0.4  --   --   PROT 6.8 7.1  --   --   ALBUMIN 3.0* 3.1* 2.6* 2.5*   CBC: Recent Labs  Lab 07/14/18 0021 07/15/18 0400 07/15/18 1045 07/17/18 0406  WBC 7.4  7.3 9.3 9.5 8.8  NEUTROABS 4.5  --   --   --   HGB 11.2*  11.2* 10.0* 10.1* 10.4*  HCT 35.3*  34.6* 33.1* 33.0* 32.7*  MCV 85.9  85.9 87.6 87.5 83.8  PLT 383  359 296 291 345    CBG: Recent Labs  Lab 07/16/18 2036 07/17/18 0038 07/17/18 0406 07/17/18 0743 07/17/18 1139  GLUCAP 92 81 87 83 81     IMAGING STUDIES Dg Chest 2 View  Result Date: 07/16/2018 CLINICAL DATA:  Acute respiratory failure, shortness of breath EXAM: CHEST - 2 VIEW COMPARISON:  07/15/2018 FINDINGS: Right dialysis catheter remains in place, unchanged. Cardiomegaly with vascular congestion and mild interstitial prominence and perihilar opacities which likely reflects early edema. Small bilateral effusions. IMPRESSION: Cardiomegaly with vascular congestion. Suspect early edema. Small effusions. Electronically Signed   By: Charlett NoseKevin  Dover M.D.   On: 07/16/2018 07:22   Dg Abdomen 1 View  Result Date: 07/14/2018 CLINICAL DATA:  NG tube  placement EXAM: ABDOMEN - 1 VIEW COMPARISON:  None. FINDINGS: Enteric tube tip is in the right upper quadrant consistent with location in the distal stomach. IMPRESSION: Enteric tube tip is in the right upper quadrant consistent with location in the distal stomach. Electronically Signed   By: Burman NievesWilliam  Stevens M.D.   On: 07/14/2018 02:17   Dg Chest Port 1 View  Result Date: 07/15/2018 CLINICAL DATA:  Line placement EXAM: PORTABLE CHEST 1 VIEW COMPARISON:  07/14/2018 FINDINGS: A right central venous catheter has been placed with tip over the cavoatrial  junction region. No pneumothorax. Interval removal of endotracheal and enteric tubes. Cardiac enlargement. Mild perihilar infiltrates could represent edema or pneumonia. No blunting of costophrenic angles. Mediastinal contours appear intact. IMPRESSION: Right central venous catheter with tip over the cavoatrial junction region. Cardiac enlargement with mild perihilar infiltrates suggesting edema or pneumonia. Electronically Signed   By: Burman Nieves M.D.   On: 07/15/2018 05:56   Dg Chest Portable 1 View  Result Date: 07/14/2018 CLINICAL DATA:  Reposition endotracheal tube EXAM: PORTABLE CHEST 1 VIEW COMPARISON:  07/14/2018 FINDINGS: Endotracheal tube tip measures 3.8 cm above the carina, demonstrating improved position since previous study. Enteric tube tip is off the field of view but below the left hemidiaphragm. Shallow inspiration. Cardiac enlargement. No focal airspace disease. No blunting of costophrenic angles. IMPRESSION: Endotracheal tube has been repositioned with tip now measuring 3.8 cm above the carina. Electronically Signed   By: Burman Nieves M.D.   On: 07/14/2018 02:21   Dg Chest Portable 1 View  Result Date: 07/14/2018 CLINICAL DATA:  Intubated EXAM: PORTABLE CHEST 1 VIEW COMPARISON:  None. FINDINGS: Low lung volumes. Tip of the endotracheal tube overlies the proximal right mainstem bronchus. Esophageal tube tip is below the diaphragm  but non included. Cardiomegaly with mild central vascular congestion. No pleural effusion or pneumothorax IMPRESSION: 1. Tip of the endotracheal tube projects over the proximal right mainstem bronchus 2. Cardiomegaly with central vascular congestion Critical Value/emergent results were called by telephone at the time of interpretation on 07/14/2018 at 1:35 am to Dr. Paula Libra , who verbally acknowledged these results. Electronically Signed   By: Jasmine Pang M.D.   On: 07/14/2018 01:35    DISCHARGE EXAMINATION: Please see progress note from earlier today  DISPOSITION: Patient to be transferred to behavioral health     Current Inpatient Medications:  Scheduled: . Chlorhexidine Gluconate Cloth  6 each Topical Q0600  . folic acid  1 mg Oral Daily  . [START ON 07/18/2018] furosemide  20 mg Oral Daily  . heparin  5,000 Units Subcutaneous Q8H  . insulin aspart  0-15 Units Subcutaneous Q4H  . [START ON 07/18/2018] levothyroxine  50 mcg Oral QAC breakfast  . [START ON 07/18/2018] potassium chloride  20 mEq Oral Daily  . thiamine  100 mg Oral Daily   Continuous:  IRJ:[id number]  Allergies as of 07/17/2018      Reactions   Aspirin Hives   Cranberry Hives, Swelling   Cranberry Extract Anaphylaxis   Hydrocodone-acetaminophen Hives   Hydrocodone Hives   Trazodone And Nefazodone Other (See Comments)   hallucinations   Vicodin [hydrocodone-acetaminophen] Hives      Medication List    STOP taking these medications   clonazePAM 0.5 MG tablet Commonly known as:  KLONOPIN   FLUoxetine 10 MG capsule Commonly known as:  PROZAC   gabapentin 300 MG capsule Commonly known as:  NEURONTIN   gabapentin 400 MG capsule Commonly known as:  NEURONTIN   lithium carbonate 300 MG CR tablet Commonly known as:  LITHOBID   lithium carbonate 450 MG CR tablet Commonly known as:  ESKALITH   NYQUIL D COLD/FLU PO   QUEtiapine 200 MG tablet Commonly known as:  SEROQUEL   QUEtiapine 50 MG  tablet Commonly known as:  SEROQUEL     TAKE these medications   albuterol 108 (90 Base) MCG/ACT inhaler Commonly known as:  PROVENTIL HFA;VENTOLIN HFA Inhale 1-2 puffs into the lungs every 6 (six) hours as needed for wheezing or shortness of breath.  folic acid 1 MG tablet Commonly known as:  FOLVITE Take 1 tablet (1 mg total) by mouth daily. Start taking on:  07/18/2018   furosemide 20 MG tablet Commonly known as:  LASIX Take 1 tablet (20 mg total) by mouth daily for 3 days. Start taking on:  07/18/2018   levothyroxine 50 MCG tablet Commonly known as:  SYNTHROID, LEVOTHROID Take 1 tablet (50 mcg total) by mouth daily before breakfast.   lisinopril 10 MG tablet Commonly known as:  PRINIVIL,ZESTRIL Take 1 tablet (10 mg total) by mouth daily.   Melatonin 3 MG Tabs Take 3 mg by mouth daily as needed.   metFORMIN 500 MG 24 hr tablet Commonly known as:  GLUCOPHAGE-XR Take 500 mg by mouth daily with breakfast.   naltrexone 50 MG tablet Commonly known as:  DEPADE Take 25 mg by mouth daily.   potassium chloride SA 20 MEQ tablet Commonly known as:  K-DUR,KLOR-CON Take 1 tablet (20 mEq total) by mouth daily for 3 days. Start taking on:  07/18/2018   propranolol 20 MG tablet Commonly known as:  INDERAL Take 1 tablet (20 mg total) by mouth 2 (two) times daily.   thiamine 100 MG tablet Take 1 tablet (100 mg total) by mouth daily. Start taking on:  07/18/2018          TOTAL DISCHARGE TIME: 35 mins  Osvaldo Shipper  [facility] Pager [phone]  07/17/2018, 2:55 PM

## 2018-07-17 NOTE — Progress Notes (Signed)
[facility] PROGRESS NOTE  Veronica Waters ZOX:[id number] DOB: [birth year] DOA: 07/14/2018  PCP: Patient, No Pcp Per  Brief History/Interval Summary: 27 year old Caucasian female with a past medical history of hypertension asthma and depressive disorder presented with suicidal attempts with multiple medications.  Apparently she was found to have Seroquel naltrexone lithium propranolol fluoxetine and lisinopril.  Patient had to be intubated to protect her airway.  She was admitted to the intensive care unit.  Subsequently extubated.  She also underwent dialysis for lithium toxicity.  Reason for Visit: Suicidal attempt.  Intentional drug overdose.  Lithium toxicity  Consultants: Critical care medicine.  Psychiatry.  Nephrology  Procedures: Dialysis  Antibiotics: None  Subjective/Interval History: Patient states that she feels better this morning.  Denies any shortness of breath today.  Feels swollen in her arms and legs.  No nausea vomiting.    ROS: Denies any headaches.  Objective:  Vital Signs  Vitals:   07/16/18 1706 07/16/18 2036 07/17/18 0530 07/17/18 0817  BP: (!) 144/91 117/66 125/76 121/83  Pulse: 87 86 84 80  Resp: (!) 22 20 18 18   Temp:  98.7 F (37.1 C) 98.4 F (36.9 C) 98.1 F (36.7 C)  TempSrc:  Oral Oral Oral  SpO2: 92% 92% 92% 94%  Weight:  (!) 140.3 kg (309 lb 4.8 oz)    Height:        Intake/Output Summary (Last 24 hours) at 07/17/2018 0936 Last data filed at 07/17/2018 0600 Gross per 24 hour  Intake 720 ml  Output 3000 ml  Net -2280 ml   Filed Weights   07/15/18 1545 07/16/18 0459 07/16/18 2036  Weight: (!) 141.8 kg (312 lb 9.8 oz) (!) 140.3 kg (309 lb 4.9 oz) (!) 140.3 kg (309 lb 4.8 oz)    General appearance: Awake alert.  In no distress. Resp: Few crackles at the bases.  Mostly clear to auscultation.  No wheezing.  Normal effort. Cardio: S1-S2 is normal regular.  No S3-S4.  No rubs murmurs or bruit GI: Abdomen is soft.  Nontender  nondistended.  Bowel sounds are present.  No masses organomegaly Extremities: Minimal edema noted bilateral upper and lower extremities Neurologic: Awake alert.  Oriented x3.  No obvious focal neurological deficits.  Lab Results:  Data Reviewed: I have personally reviewed following labs and imaging studies  CBC: Recent Labs  Lab 07/14/18 0021 07/15/18 0400 07/15/18 1045 07/17/18 0406  WBC 7.4  7.3 9.3 9.5 8.8  NEUTROABS 4.5  --   --   --   HGB 11.2*  11.2* 10.0* 10.1* 10.4*  HCT 35.3*  34.6* 33.1* 33.0* 32.7*  MCV 85.9  85.9 87.6 87.5 83.8  PLT 383  359 296 291 345    Basic Metabolic Panel: Recent Labs  Lab 07/14/18 0522  07/15/18 0326 07/15/18 1042 07/15/18 1802 07/16/18 0630 07/16/18 1530 07/17/18 0406  NA  --    < > 141 142 140 141 142 140  K  --    < > 4.3 2.7* 2.9* 3.1* 3.6 4.1  CL  --    < > 114* 115* 105 108 108 108  CO2  --    < > 22 24 27 26 25 22   GLUCOSE  --    < > 118* 93 100* 99 112* 94  BUN  --    < > 4* <5* <5* <5* <5* <5*  CREATININE  --    < > 0.70 0.78 0.57 0.76 0.90 0.66  CALCIUM  --    < >  8.3* 8.1* 7.5* 7.2* 8.0* 8.2*  MG 2.2  --   --   --  1.6*  --   --  2.2  PHOS 3.7  --  2.4* 2.1* 1.6* 3.4  --   --    < > = values in this interval not displayed.    GFR: Estimated Creatinine Clearance: 156.6 mL/min (by C-G formula based on SCr of 0.66 mg/dL).  Liver Function Tests: Recent Labs  Lab 07/14/18 0021 07/14/18 1101 07/15/18 0326 07/15/18 1042  AST 17 27  --   --   ALT 22 34  --   --   ALKPHOS 67 67  --   --   BILITOT 0.2* 0.4  --   --   PROT 6.8 7.1  --   --   ALBUMIN 3.0* 3.1* 2.6* 2.5*    CBG: Recent Labs  Lab 07/16/18 1624 07/16/18 2036 07/17/18 0038 07/17/18 0406 07/17/18 0743  GLUCAP 100* 92 81 87 83    Thyroid Function Tests: Recent Labs    07/14/18 1101  TSH 1.730     Recent Results (from the past 240 hour(s))  MRSA PCR Screening     Status: None   Collection Time: 07/14/18  3:30 AM  Result Value Ref  Range Status   MRSA by PCR NEGATIVE NEGATIVE Final    Comment:        The GeneXpert MRSA Assay (FDA approved for NASAL specimens only), is one component of a comprehensive MRSA colonization surveillance program. It is not intended to diagnose MRSA infection nor to guide or monitor treatment for MRSA infections. Performed at [facility], [address] [place], [place] [address]       Radiology Studies: Dg Chest 2 View  Result Date: 07/16/2018 CLINICAL DATA:  Acute respiratory failure, shortness of breath EXAM: CHEST - 2 VIEW COMPARISON:  07/15/2018 FINDINGS: Right dialysis catheter remains in place, unchanged. Cardiomegaly with vascular congestion and mild interstitial prominence and perihilar opacities which likely reflects early edema. Small bilateral effusions. IMPRESSION: Cardiomegaly with vascular congestion. Suspect early edema. Small effusions. Electronically Signed   By: Charlett Nose M.D.   On: 07/16/2018 07:22     Medications:  Scheduled: . Chlorhexidine Gluconate Cloth  6 each Topical Q0600  . folic acid  1 mg Per Tube Daily  . furosemide  20 mg Intravenous Once  . [START ON 07/18/2018] furosemide  20 mg Oral Daily  . heparin  5,000 Units Subcutaneous Q8H  . insulin aspart  0-15 Units Subcutaneous Q4H  . levothyroxine  50 mcg Per Tube QAC breakfast  . potassium chloride  20 mEq Oral Once  . thiamine  100 mg Per Tube Daily   Continuous: . sodium chloride    . sodium chloride     HYQ:MVHQIO chloride, sodium chloride, alteplase, heparin, heparin, lidocaine (PF), lidocaine-prilocaine, ondansetron, pentafluoroprop-tetrafluoroeth  Assessment/Plan:    Suicidal attempt with multiple drug overdose She took an unknown quantity as well as unknown types of medications.  She was found to have multiple medication bottles next to her.  She was however found to have elevated serum lithium levels.  See below.  Seen by psychiatry who recommends inpatient  psychiatric hospitalizations once medically stable.  [employer].  Patient is medically stable today.  Intentional drug overdose with lithium toxicity Patient seen by nephrology.  Underwent hemodialysis.  Lithium levels have normalized.  No further dialysis.  Dialysis catheter was removed on 7/24.   Acute respiratory failure This was in  the setting of drug overdose and inability to protect airway in the setting of encephalopathy.  She was in the ICU.  She was successfully extubated.  As of breath on 7/24.  Chest x-ray suggested mild coronary edema.  She did get a lot of fluids during her stay in the ICU.  She was given a dose of Lasix yesterday with good diuresis and improvement in her symptoms.  She will be given an additional dose of IV Lasix today and oral doses of Lasix for 3 more days from tomorrow.  She is now saturating normal on room air.  Prolonged QT interval She was on Seroquel and likely overdosed on same.  Stable currently.  QTc has normalized.  Diabetes mellitus type 2 Monitor CBGs.  Continue SSI.  Hypothyroidism Continue Synthroid.  Normocytic anemia Hemoglobin has been stable.  No evidence for bleeding.  Hypokalemia and hypomagnesemia This has been repleted.  Levels are normal.  Supplement potassium while she is taking Lasix.  Magnesium is normal as well.     DVT Prophylaxis: Subcutaneous heparin    Code Status: Full code Family Communication: Discussed with the patient Disposition Plan: Patient is stable.  Medically cleared for transfer to behavioral health.   LOS: 3 days   Osvaldo ShipperGokul Alicja Everitt  [facility] Pager [phone] 07/17/2018, 9:36 AM  If 7PM-7AM, please contact night-coverage at [email or web address], password [email or web address]

## 2018-07-17 NOTE — Clinical Social Work Note (Signed)
CSW received consult for inpatient psychiatric placement for patient attending MD cleared Ms. Veronica Waters for discharge today. [facility] and [facility] today. Patient will be picked up at 5 pm by [facility] transportation and [facility]Nurse provided with phone number to call report and asked to call before patient leaves the hospital. Ms. Veronica Waters signed voluntary admission form earlier today and [facility] to [facility].  CSW signing off as no further SW intervention services needed.  Veronica Waters, MSW, LCSW Licensed Clinical Social Worker Clinical Social Work Department [facility] [phone]

## 2018-07-17 NOTE — Tx Team (Signed)
Initial Treatment Plan 07/17/2018 8:18 PM Veronica Waters WUJ:[id number]    PATIENT STRESSORS: Marital or family conflict Medication change or noncompliance Occupational concerns   PATIENT STRENGTHS: Ability for insight Average or above average intelligence Capable of independent living General fund of knowledge   PATIENT IDENTIFIED PROBLEMS: Depression Suicidal thoughts "Medication adjustment"                     DISCHARGE CRITERIA:  Ability to meet basic life and health needs Improved stabilization in mood, thinking, and/or behavior Reduction of life-threatening or endangering symptoms to within safe limits Verbal commitment to aftercare and medication compliance  PRELIMINARY DISCHARGE PLAN: Attend aftercare/continuing care group Return to previous living arrangement  PATIENT/FAMILY INVOLVEMENT: This treatment plan has been presented to and reviewed with the patient, Veronica Waters, and/or family member, .  The patient and family have been given the opportunity to ask questions and make suggestions.  Mykaela Arena, [place], [place] 07/17/2018, 8:18 PM

## 2018-07-17 NOTE — Progress Notes (Signed)
Adult Psychoeducational Group Note  Date:  07/17/2018 Time:  9:08 PM  Group Topic/Focus:  Wrap-Up Group:   The focus of this group is to help patients review their daily goal of treatment and discuss progress on daily workbooks.  Participation Level:  Active  Participation Quality:  Appropriate  Affect:  Appropriate  Cognitive:  Alert  Insight: Appropriate  Engagement in Group:  Engaged  Modes of Intervention:  Discussion  Additional Comments:  Patient's goal for today was to try to get on new medication.   Corri Delapaz L Meridee Branum 07/17/2018, 9:08 PM

## 2018-07-18 DIAGNOSIS — F314 Bipolar disorder, current episode depressed, severe, without psychotic features: Secondary | ICD-10-CM

## 2018-07-18 LAB — GLUCOSE, CAPILLARY: Glucose-Capillary: 76 mg/dL (ref 70–99)

## 2018-07-18 MED ORDER — ONDANSETRON 4 MG PO TBDP
4.0000 mg | ORAL_TABLET | Freq: Three times a day (TID) | ORAL | Status: DC | PRN
  Administered 2018-07-18 – 2018-07-23 (×3): 4 mg via ORAL
  Filled 2018-07-18 (×3): qty 1

## 2018-07-18 MED ORDER — QUETIAPINE FUMARATE 50 MG PO TABS
50.0000 mg | ORAL_TABLET | Freq: Every day | ORAL | Status: DC
  Administered 2018-07-18: 50 mg via ORAL
  Filled 2018-07-18 (×2): qty 1

## 2018-07-18 MED ORDER — HYDROXYZINE HCL 25 MG PO TABS
25.0000 mg | ORAL_TABLET | Freq: Four times a day (QID) | ORAL | Status: DC | PRN
  Administered 2018-07-18 – 2018-07-26 (×11): 25 mg via ORAL
  Filled 2018-07-18 (×6): qty 1
  Filled 2018-07-18: qty 10
  Filled 2018-07-18 (×4): qty 1

## 2018-07-18 MED ORDER — METFORMIN HCL 500 MG PO TABS
500.0000 mg | ORAL_TABLET | Freq: Every day | ORAL | Status: DC
  Administered 2018-07-19 – 2018-07-28 (×8): 500 mg via ORAL
  Filled 2018-07-18 (×12): qty 1

## 2018-07-18 NOTE — Tx Team (Signed)
Interdisciplinary Treatment and Diagnostic Plan Update  07/18/2018 Time of Session: 8016 Veronica Waters MRN: [id number]  Principal Diagnosis: <principal problem not specified>  Secondary Diagnoses: Active Problems:   Bipolar affective disorder ([place])   Current Medications:  Current Facility-Administered Medications  Medication Dose Route Frequency Provider Last Rate Last Dose  . folic acid (FOLVITE) tablet 1 mg  1 mg Oral Daily Rankin, Shuvon B, NP   1 mg at 07/18/18 0802  . furosemide (LASIX) tablet 20 mg  20 mg Oral Daily Rankin, Shuvon B, NP   20 mg at 07/18/18 0802  . hydrOXYzine (ATARAX/VISTARIL) tablet 25 mg  25 mg Oral Q6H PRN Cobos, Myer Peer, MD      . levothyroxine (SYNTHROID, LEVOTHROID) tablet 50 mcg  50 mcg Oral QAC breakfast Rankin, Shuvon B, NP   50 mcg at 07/18/18 0625  . lisinopril (PRINIVIL,ZESTRIL) tablet 10 mg  10 mg Oral Daily Lindon Romp A, NP   10 mg at 07/18/18 0802  . [START ON 07/19/2018] metFORMIN (GLUCOPHAGE) tablet 500 mg  500 mg Oral Q breakfast Cobos, Myer Peer, MD      . QUEtiapine (SEROQUEL) tablet 50 mg  50 mg Oral QHS Cobos, Fernando A, MD      . thiamine (VITAMIN B-1) tablet 100 mg  100 mg Oral Daily Rankin, Shuvon B, NP   100 mg at 07/18/18 0802   PTA Medications: Medications Prior to Admission  Medication Sig Dispense Refill Last Dose  . albuterol (PROVENTIL HFA;VENTOLIN HFA) 108 (90 Base) MCG/ACT inhaler Inhale 1-2 puffs into the lungs every 6 (six) hours as needed for wheezing or shortness of breath. 1 Inhaler 0 unknown  . folic acid (FOLVITE) 1 MG tablet Take 1 tablet (1 mg total) by mouth daily.     . furosemide (LASIX) 20 MG tablet Take 1 tablet (20 mg total) by mouth daily for 3 days. 3 tablet    . levothyroxine (SYNTHROID, LEVOTHROID) 50 MCG tablet Take 1 tablet (50 mcg total) by mouth daily before breakfast. 30 tablet 0 unknown  . lisinopril (PRINIVIL,ZESTRIL) 10 MG tablet Take 1 tablet (10 mg total) by mouth daily. 30 tablet 0 unknown  .  Melatonin 3 MG TABS Take 3 mg by mouth daily as needed.   unknown  . metFORMIN (GLUCOPHAGE-XR) 500 MG 24 hr tablet Take 500 mg by mouth daily with breakfast.   unknown  . naltrexone (DEPADE) 50 MG tablet Take 25 mg by mouth daily.   unknow  . potassium chloride SA (K-DUR,KLOR-CON) 20 MEQ tablet Take 1 tablet (20 mEq total) by mouth daily for 3 days. 3 tablet 0   . propranolol (INDERAL) 20 MG tablet Take 1 tablet (20 mg total) by mouth 2 (two) times daily. 60 tablet 0 unknown  . thiamine 100 MG tablet Take 1 tablet (100 mg total) by mouth daily.       Patient Stressors: Marital or family conflict Medication change or noncompliance Occupational concerns  Patient Strengths: Ability for insight Average or above average intelligence Capable of independent living General fund of knowledge  Treatment Modalities: Medication Management, Group therapy, Case management,  1 to 1 session with clinician, Psychoeducation, Recreational therapy.   Physician Treatment Plan for Primary Diagnosis: <principal problem not specified> Long Term Goal(s): Improvement in symptoms so as ready for discharge Improvement in symptoms so as ready for discharge   Short Term Goals: Ability to identify changes in lifestyle to reduce recurrence of condition will improve Ability to maintain clinical measurements within normal limits  will improve Ability to verbalize feelings will improve Ability to disclose and discuss suicidal ideas Ability to demonstrate self-control will improve Ability to identify and develop effective coping behaviors will improve Ability to maintain clinical measurements within normal limits will improve  Medication Management: Evaluate patient's response, side effects, and tolerance of medication regimen.  Therapeutic Interventions: 1 to 1 sessions, Unit Group sessions and Medication administration.  Evaluation of Outcomes: Not Met  Physician Treatment Plan for Secondary Diagnosis: Active  Problems:   Bipolar affective disorder (Veronica Waters)  Long Term Goal(s): Improvement in symptoms so as ready for discharge Improvement in symptoms so as ready for discharge   Short Term Goals: Ability to identify changes in lifestyle to reduce recurrence of condition will improve Ability to maintain clinical measurements within normal limits will improve Ability to verbalize feelings will improve Ability to disclose and discuss suicidal ideas Ability to demonstrate self-control will improve Ability to identify and develop effective coping behaviors will improve Ability to maintain clinical measurements within normal limits will improve     Medication Management: Evaluate patient's response, side effects, and tolerance of medication regimen.  Therapeutic Interventions: 1 to 1 sessions, Unit Group sessions and Medication administration.  Evaluation of Outcomes: Not Met   RN Treatment Plan for Primary Diagnosis: <principal problem not specified> Long Term Goal(s): Knowledge of disease and therapeutic regimen to maintain health will improve  Short Term Goals: Ability to identify and develop effective coping behaviors will improve and Compliance with prescribed medications will improve  Medication Management: RN will administer medications as ordered by provider, will assess and evaluate patient's response and provide education to patient for prescribed medication. RN will report any adverse and/or side effects to prescribing provider.  Therapeutic Interventions: 1 on 1 counseling sessions, Psychoeducation, Medication administration, Evaluate responses to treatment, Monitor vital signs and CBGs as ordered, Perform/monitor CIWA, COWS, AIMS and Fall Risk screenings as ordered, Perform wound care treatments as ordered.  Evaluation of Outcomes: Not Met   LCSW Treatment Plan for Primary Diagnosis: <principal problem not specified> Long Term Goal(s): Safe transition to appropriate next level of care  at discharge, Engage patient in therapeutic group addressing interpersonal concerns.  Short Term Goals: Engage patient in aftercare planning with referrals and resources, Increase social support and Increase skills for wellness and recovery  Therapeutic Interventions: Assess for all discharge needs, 1 to 1 time with Social worker, Explore available resources and support systems, Assess for adequacy in community support network, Educate family and significant other(s) on suicide prevention, Complete Psychosocial Assessment, Interpersonal group therapy.  Evaluation of Outcomes: Not Met   Progress in Treatment: Attending groups: No. Participating in groups: No. Taking medication as prescribed: Yes. Toleration medication: Yes. Family/Significant other contact made: No, will contact:  when given permission Patient understands diagnosis: Yes. Discussing patient identified problems/goals with staff: Yes. Medical problems stabilized or resolved: Yes. Denies suicidal/homicidal ideation: Yes. Issues/concerns per patient self-inventory: No. Other: none  New problem(s) identified: No, Describe:  none  New Short Term/Long Term Goal(s):  Patient Goals:  "To be back on my meds the right way."  Discharge Plan or Barriers:   Reason for Continuation of Hospitalization: Depression Medication stabilization  Estimated Length of Stay: 3-5 days.  Attendees: Patient: Veronica Waters 07/18/2018   Physician: Dr. Parke Poisson, MD 07/18/2018   Nursing: Sena Hitch, RN 07/18/2018   RN Care Manager: 07/18/2018   Social Worker: Lurline Idol, LCSW 07/18/2018   Recreational Therapist:  07/18/2018   Other:  07/18/2018   Other:  07/18/2018   Other: 07/18/2018     Scribe for Treatment Team: Joanne Chars, [place] 07/18/2018 12:44 PM
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
## 2018-07-18 NOTE — Progress Notes (Signed)
Recreation Therapy Notes  Date: 7.26.19 Time: 0930 Location: [address] Dayroom  Group Topic: Stress Management  Goal Area(s) Addresses:  Patient will verbalize importance of using healthy stress management.  Patient will identify positive emotions associated with healthy stress management.   Intervention: Stress Management  Activity : Progressive Muscle Relaxation.  LRT introduced the stress management technique of progressive muscle relaxation.  LRT read a script that guided patients through a series of tensing muscles and then relaxing.  Patients were to follow along as LRT read script to engage in the activity.  Education:  Stress Management, Discharge Planning.   Education Outcome: Acknowledges edcuation/In group clarification offered/Needs additional education  Clinical Observations/Feedback: Pt did not attend group.    Tyshea Imel, LRT/CTRS         Agamjot Kilgallon A 07/18/2018 11:07 AM 
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
## 2018-07-18 NOTE — Progress Notes (Signed)
D    Pt is depressed and anxious   She also displays irritability and anger but has been able to maintain control   She interacts minimally with others and has been visible on the milieu this evening  A   Verbal support given   Medications administered and effectiveness monitored    Q 15 min checks R   Pt remains safe

## 2018-07-18 NOTE — [facility] Group Notes (Signed)
  [facility] LCSW Group Therapy Note  Date/Time: 07/18/18, 1315  Type of Therapy/Topic:  Group Therapy:  Emotion Regulation  Participation Level:  Minimal   Mood:  Description of Group:    The purpose of this group is to assist patients in learning to regulate negative emotions and experience positive emotions. Patients will be guided to discuss ways in which they have been vulnerable to their negative emotions. These vulnerabilities will be juxtaposed with experiences of positive emotions or situations, and patients challenged to use positive emotions to combat negative ones. Special emphasis will be placed on coping with negative emotions in conflict situations, and patients will process healthy conflict resolution skills.  Therapeutic Goals: 1. Patient will identify two positive emotions or experiences to reflect on in order to balance out negative emotions:  2. Patient will label two or more emotions that they find the most difficult to experience:  3. Patient will be able to demonstrate positive conflict resolution skills through discussion or role plays:   Summary of Patient Progress: Pt initially came to group, identified anxiety, anger, depression all as emotions that are difficult for her to experience.  Pt was then called out by the unit social worker.       Therapeutic Modalities:   Cognitive Behavioral Therapy Feelings Identification Dialectical Behavioral Therapy  Daleen SquibbGreg Avangelina Flight, LCSW

## 2018-07-19 MED ORDER — LAMOTRIGINE 25 MG PO TABS
25.0000 mg | ORAL_TABLET | Freq: Every day | ORAL | Status: DC
  Administered 2018-07-19 – 2018-07-23 (×5): 25 mg via ORAL
  Filled 2018-07-19 (×6): qty 1

## 2018-07-19 MED ORDER — QUETIAPINE FUMARATE 100 MG PO TABS
100.0000 mg | ORAL_TABLET | Freq: Every day | ORAL | Status: DC
  Administered 2018-07-19 – 2018-07-20 (×2): 100 mg via ORAL
  Filled 2018-07-19 (×4): qty 1

## 2018-07-19 NOTE — Progress Notes (Signed)
Psychoeducational Group Note  Date:  07/19/2018 Time:  2143  Group Topic/Focus:  Wrap-Up Group:   The focus of this group is to help patients review their daily goal of treatment and discuss progress on daily workbooks.  Participation Level: Did Not Attend  Participation Quality:  Not Applicable  Affect:  Not Applicable  Cognitive:  Not Applicable  Insight:  Not Applicable  Engagement in Group: Not Applicable  Additional Comments:  The patient did not attend group since she remained in her bedroom.   Hazle CocaGOODMAN, Jireh Vinas S 07/19/2018, 9:43 PM

## 2018-07-19 NOTE — Progress Notes (Signed)
[facility] MD Progress Note  07/19/2018 5:34 PM Veronica Waters  MRN:  [id number] Subjective: patient reports she remains depressed. Denies medication side effects.  Objective : I have reviewed chart notes, have met with patient , and have discussed case with RN. 27 year old female, history of Bipolar Disorder, Borderline Personality Disorder, PTSD, who is S/P serious suicide attempt by overdose which required ICU treatment, intubation, dialysis. She reports persistent depression, and presents anxious, sad, depressed and vaguely irritable. Denies medication side effects- has been restarted on Seroquel following her recent overdose. She states she feels Seroquel was helping her feel better, and denies having had side effects. Also denies weight gain associated with this medication and reports she has lost about 20-30 lbs over recent months . We discussed further medication options and agreed to Lamictal trial to address Bipolar Disorder. Denies suicidal plan or intention at this time, but reports that she has chronic, intermittent suicidal ideations, and that she is not happy that she survived recent attempt . No disruptive or agitated behaviors on unit, going to some groups.  Principal Problem: Bipolar Disorder, Borderline Personality Disorder, PTSD, S/P Suicide Attempt Diagnosis:   Patient Active Problem List   Diagnosis Date Noted  . Bipolar affective disorder ([place]) [F31.9] 07/17/2018  . Overdose [T50.901A] 07/14/2018  . Suicide attempt ([place]) [T14.91XA]   . Endotracheally intubated [Z97.8]   . Posttraumatic stress disorder [F43.10]   . Borderline personality disorder ([place]) [F60.3]   . Diabetes mellitus type 2 in obese (HCC) [E11.69, E66.9]   . Bipolar 1 disorder, mixed, severe ([place]) [F31.63] 06/28/2018  . Tachycardia [R00.0] 01/18/2018  . Severe recurrent major depression without psychotic features ([place]) [F33.2] 01/05/2018  . MDD (major depressive disorder), recurrent severe, without psychosis ([place])  [F33.2] 09/01/2017   Total Time spent with patient: 20 minutes  Past Psychiatric History:   Past Medical History:  Past Medical History:  Diagnosis Date  . Asthma   . Hypertension   . Major depressive disorder    History reviewed. No pertinent surgical history. Family History:  Family History  Family history unknown: Yes   Family Psychiatric  History:  Social History:  Social History   Substance and Sexual Activity  Alcohol Use No     Social History   Substance and Sexual Activity  Drug Use No    Social History   Socioeconomic History  . Marital status: Divorced    Spouse name: Not on file  . Number of children: Not on file  . Years of education: Not on file  . Highest education level: Not on file  Occupational History  . Not on file  Social Needs  . Financial resource strain: Not on file  . Food insecurity:    Worry: Not on file    Inability: Not on file  . Transportation needs:    Medical: Not on file    Non-medical: Not on file  Tobacco Use  . Smoking status: Never Smoker  . Smokeless tobacco: Never Used  Substance and Sexual Activity  . Alcohol use: No  . Drug use: No  . Sexual activity: Not Currently    Birth control/protection: Other-see comments    Comment: Rod in left arm for birth control  Lifestyle  . Physical activity:    Days per week: Not on file    Minutes per session: Not on file  . Stress: Not on file  Relationships  . Social connections:    Talks on phone: Not on file  Gets together: Not on file    Attends religious service: Not on file    Active member of club or organization: Not on file    Attends meetings of clubs or organizations: Not on file    Relationship status: Not on file  Other Topics Concern  . Not on file  Social History Narrative  . Not on file   Additional Social History:   Sleep: Fair  Appetite:  Fair  Current Medications: Current Facility-Administered Medications  Medication Dose Route Frequency  Provider Last Rate Last Dose  . folic acid (FOLVITE) tablet 1 mg  1 mg Oral Daily Rankin, Shuvon B, NP   1 mg at 07/19/18 1117  . furosemide (LASIX) tablet 20 mg  20 mg Oral Daily Rankin, Shuvon B, NP   20 mg at 07/19/18 1117  . hydrOXYzine (ATARAX/VISTARIL) tablet 25 mg  25 mg Oral Q6H PRN Cobos, Myer Peer, MD   25 mg at 07/19/18 1717  . lamoTRIgine (LAMICTAL) tablet 25 mg  25 mg Oral Daily Cobos, Myer Peer, MD   25 mg at 07/19/18 1117  . levothyroxine (SYNTHROID, LEVOTHROID) tablet 50 mcg  50 mcg Oral QAC breakfast Rankin, Shuvon B, NP   50 mcg at 07/19/18 1118  . lisinopril (PRINIVIL,ZESTRIL) tablet 10 mg  10 mg Oral Daily Lindon Romp A, NP   10 mg at 07/19/18 1118  . metFORMIN (GLUCOPHAGE) tablet 500 mg  500 mg Oral Q breakfast Cobos, Myer Peer, MD   500 mg at 07/19/18 1117  . ondansetron (ZOFRAN-ODT) disintegrating tablet 4 mg  4 mg Oral Q8H PRN Cobos, Myer Peer, MD   4 mg at 07/19/18 1221  . QUEtiapine (SEROQUEL) tablet 100 mg  100 mg Oral QHS Cobos, Fernando A, MD      . thiamine (VITAMIN B-1) tablet 100 mg  100 mg Oral Daily Rankin, Shuvon B, NP   100 mg at 07/19/18 1117    Lab Results:  Results for orders placed or performed during the hospital encounter of 07/17/18 (from the past 48 hour(s))  Glucose, capillary     Status: Abnormal   Collection Time: 07/17/18  8:08 PM  Result Value Ref Range   Glucose-Capillary 101 (H) 70 - 99 mg/dL  Glucose, capillary     Status: None   Collection Time: 07/18/18  6:09 AM  Result Value Ref Range   Glucose-Capillary 76 70 - 99 mg/dL    Blood Alcohol level:  Lab Results  Component Value Date   ETH 40 (H) 07/14/2018   ETH <10 74/11/8785    Metabolic Disorder Labs: Lab Results  Component Value Date   HGBA1C 5.4 06/30/2018   MPG 108.28 06/30/2018   MPG 108.28 01/06/2018   Lab Results  Component Value Date   PROLACTIN 23.5 (H) 06/30/2018   Lab Results  Component Value Date   CHOL 230 (H) 06/30/2018   TRIG 366 (H) 06/30/2018    HDL 33 (L) 06/30/2018   CHOLHDL 7.0 06/30/2018   VLDL 73 (H) 06/30/2018   LDLCALC 124 (H) 06/30/2018   LDLCALC 158 (H) 01/06/2018    Physical Findings: AIMS: Facial and Oral Movements Muscles of Facial Expression: None, normal Lips and Perioral Area: None, normal Jaw: None, normal Tongue: None, normal,Extremity Movements Upper (arms, wrists, hands, fingers): None, normal Lower (legs, knees, ankles, toes): None, normal, Trunk Movements Neck, shoulders, hips: None, normal, Overall Severity Severity of abnormal movements (highest score from questions above): None, normal Incapacitation due to abnormal movements: None, normal Patient's awareness  of abnormal movements (rate only patient's report): No Awareness, Dental Status Current problems with teeth and/or dentures?: No Does patient usually wear dentures?: No  CIWA:    COWS:     Musculoskeletal: Strength & Muscle Tone: within normal limits Gait & Station: normal Patient leans: N/A  Psychiatric Specialty Exam: Physical Exam  ROS denies chest pain, no shortness of breath, no vomiting , no fever, no chills   Blood pressure (!) 127/92, pulse 92, temperature 98.2 F (36.8 C), temperature source Oral, resp. rate 18, height '5\' 7"'$  (1.702 m), weight (!) 139.7 kg (308 lb).Body mass index is 48.24 kg/m.  General Appearance: Fairly Groomed  Eye Contact:  Fair  Speech:  Normal Rate  Volume:  Normal  Mood:  remains depressed and anxious  Affect:  constricted, anxious, vaguely irritable  Thought Process:  Linear and Descriptions of Associations: Intact  Orientation:  Other:  fully alert and attentive  Thought Content:  no hallucinations, no delusions  Suicidal Thoughts:  Yes.  without intent/plan reports chronic suicidal ideations   Homicidal Thoughts:  No  Memory:  recent and remote grossly intact   Judgement:  Fair  Insight:  Fair  Psychomotor Activity:  Normal  Concentration:  Concentration: Fair and Attention Span: Fair  Recall:   [employer] of Knowledge:  Fair  Language:  Good  Akathisia:  Negative  Handed:  Right  AIMS (if indicated):     Assets:  Desire for Improvement Resilience  ADL's:  Intact  Cognition:  WNL  Sleep:  Number of Hours: 5.5   Assessment - patient remains depressed, dysphoric and vaguely irritable. She endorses chronic suicidal ideations, which she states have been present for years. She states she still would prefer to have died from recent overdose . Denies medication side effects thus far and does state Seroquel has been well tolerated and helpful in the past .   Treatment Plan Summary: Daily contact with patient to assess and evaluate symptoms and progress in treatment, Medication management, Plan inpatient treatment and medications as below Encourage group and milieu participation to work on coping skills and symptom reduction Increase Seroquel to 100 mgrs QHS for mood disorder Start Lamictal 25 mgrsQDAY for mood disorder, depression Continue Vistaril 25 mgrs Q 6 hours PRN for anxiety as needed  Continue Glucophage for DM, Lisinopril for HTN, Synthroid for hypothyroidism Treatment team working on disposition planning options  Jenne Campus, MD   5,45 PM addendum- RN reports patient has continued to ruminate about death, dying and has been unable to clearly contract for safety. As noted , has reported chronic suicidal ideations, and is status post a severe suicide attempt by overdose. Based on above, determination was made with staff to increase observation level to one to one for safety. FC 07/19/2018, 5:34 PM

## 2018-07-19 NOTE — [facility] Group Notes (Signed)
[facility] Group Notes: (Clinical Social Work)   07/19/2018      Type of Therapy:  Group Therapy   Participation Level:  Did Not Attend despite MHT prompting   Shellia CleverlyStephanie N Murlean Seelye, LCSW  07/19/2018 11:54 AM

## 2018-07-19 NOTE — Progress Notes (Signed)
Adult Psychoeducational Group Note  Date:  07/19/2018 Time:  1:26 AM  Group Topic/Focus:  Wrap-Up Group:   The focus of this group is to help patients review their daily goal of treatment and discuss progress on daily workbooks.  Participation Level:  Active  Participation Quality:  Appropriate  Affect:  Appropriate  Cognitive:  Appropriate  Insight: Appropriate  Engagement in Group:  Engaged  Modes of Intervention:  Discussion  Additional Comments:  Pt stated she is not happy about being here and had a bad week.  Pt rated the day at a 2/10.  Veronica Waters 07/19/2018, 1:26 AM

## 2018-07-19 NOTE — Progress Notes (Signed)
1:1 note Pt is brighter but still endorses depression and hopelessness   She has been out of her room and talked on the phone    She cannot contract for safety   Verbal support given  Medications administered and effectiveness monitored    Pt on a 1:1 for safety   Safety maintained

## 2018-07-20 LAB — GLUCOSE, CAPILLARY
GLUCOSE-CAPILLARY: 79 mg/dL (ref 70–99)
GLUCOSE-CAPILLARY: 80 mg/dL (ref 70–99)
Glucose-Capillary: 80 mg/dL (ref 70–99)
Glucose-Capillary: 75 mg/dL (ref 70–99)

## 2018-07-20 NOTE — Progress Notes (Signed)
1:1 observation note:  Patient continues to be mute with periods of sobbing at times.  She is refusing to eat or drink.  She is aware that her blood sugar is dropping.  Last reading was 75.  Informed patient that once she reaches 70 or lower, she will be transferred to ED for IV fluids.  Patient will not acknowledge staff and continues to avert her eyes.  She will remain a 1:1 due to safety.  Also advised Dr. Jama Flavorsobos of CBG and patient will be sent to ED for IV fluids if she continues to refuse nutrition and fluids.

## 2018-07-20 NOTE — Progress Notes (Signed)
Pt is refusing to let staff check her CBGs at this time.  Hall MHT and writer requested from pt twice to allow her blood sugar to be checked, but she refused.  Writer informed the evening provider and the [facility] of [facility]'s refusal.  Provider ordered q 2h neuro checks to be done even throughout the night.  At this time, pt is responsive and strongly refusing to allow staff to do the CBG.  "I'm an adult and I have the right to refuse".  Sitter at bedside.  Pt is safe at this time.

## 2018-07-20 NOTE — Progress Notes (Signed)
Patient came up to phone.  She was speaking on the telephone to her boyfriend.  She was animated and laughing.  She also called her mother.  She continued to laugh and joke with staff observing.  She came to the med window after the call and requested a vistaril.  She states, "guess what my boyfriend did?  He called my workplace and told them about my overdose and why I'm in the hospital!"  She did not appear overly upset over this, as she was smiling throughout the conversation.  She was told there was one thing she needed to be concerned about.  She states, "I know.  I need to eat.  I can see it in your eyes."  She stood at the window conversing with this nurse about ECT treatment and a possible transfer to [place].  Patient was brought a tray and she refused to eat.  She states, "ya'll can be so annoying!"  Informed patient that if her CBG drops again, she will probably go to the ED and it would be out of our hands.  Informed her that she would not remain in our care with a low blood sugar and continuing to refuse food.  Her metformin is to be held if CBG is under 120 in the am.  CBGs are now 4 X achs.

## 2018-07-20 NOTE — Progress Notes (Signed)
1:1 observation note:  Patient reluctantly got out of bed.  She allowed the MHT to take her blood pressure and CBG.  Patient is mute this morning and will not look at staff or interact.  She will remain a 1:1 due to manipulative, impulsive behaviors.  She cannot contract for safety at this time.  Patient is irritable; her affect is sullen.

## 2018-07-20 NOTE — [facility] Group Notes (Addendum)
LCSW Group Therapy Note  07/20/2018    10:00 - 10:50 AM               Type of Therapy and Topic:  Group Therapy: Understanding Depression / Exploring Positive Ways to Cope  Participation Level:  Did not attend   Description of Group:   In this group session, patients learned how to define depression as well as recognize the difference between depression and sadness. Patients talked about how typically people respond with one of the 3 E's - escape, explode or express. Patients defined depression, analyzed the symptoms of their depression and how depression affects their life currently. Patients were asked to scale their depression. Patients explored triggers and current coping techniques for depression. Patient's talked about self sabotage briefly. Patients were introduced to several positive coping skills as well as emotional regulation skills.Patients talked about the importance of support with depression and different types of supports. Patients were asked to complete an assessment worksheet analyzing their current supports and those that they wish to have in the future.   Therapeutic Goals: 1. Patients will explore depression - the definition, symptoms, triggers,and how it affects their life.  2. Patients will discuss the importance of support with an emphasis on therapy and medications.  3. Patients will learn three emotional regulation skills (opposite action, check the facts, PLEASE) 4. Patients were encouraged to close on a positive note, sharing their happy place with the group - another coping technique.  Summary of Patient Progress:  Patient did not attend group.   Therapeutic Modalities:   Cognitive Behavioral Therapy Motivational Interviewing  Brief Therapy  Shellia CleverlyStephanie N Delynn Olvera, LCSW  07/20/2018

## 2018-07-20 NOTE — Plan of Care (Signed)
  Problem: Education: Goal: Knowledge of the prescribed therapeutic regimen will improve Outcome: Progressing   Problem: Activity: Goal: Interest or engagement in leisure activities will improve Outcome: Not Progressing   Problem: Coping: Goal: Coping ability will improve Outcome: Not Progressing Goal: Will verbalize feelings Outcome: Not Progressing   Problem: Role Relationship: Goal: Will demonstrate positive changes in social behaviors and relationships Outcome: Not Progressing   Problem: Self-Concept: Goal: Will verbalize positive feelings about self Outcome: Not Progressing Goal: Level of anxiety will decrease Outcome: Not Progressing   Problem: Education: Goal: Emotional status will improve Outcome: Not Progressing Goal: Mental status will improve Outcome: Not Progressing

## 2018-07-20 NOTE — Progress Notes (Signed)
Pt continues to rest in bed with no distress observed    She is on a 1:1 for her safety as she cannot contract for safety at this time   She can be gamey and manipulative and it is felt by staff that she may try to harm herself   She is presently safe and with her sitter

## 2018-07-20 NOTE — Progress Notes (Signed)
1:1 note  Pt has been resting in bed with no complaints   She is on a 1:1 with sitter because she cannot contract not to hurt herself and feels out of control    She is presently safe

## 2018-07-20 NOTE — Progress Notes (Signed)
Pt got up around 2230 requesting her night time Seroquel.  Writer told her it would be best to check her CBG before giving her any medication that would sedate her.  She allowed writer to check her CBG which was 80.  Pt sarcastically said, "I'm alive, can I get my medicine?"  Writer gave pt her medication and then pt went to use the phone.  While she was on the phone, writer took her a cup of gatorade.  She laughed and said, "what do you want me to do with this?"  Writer told her that since she had not been drinking and she was talking on the phone that her mouth must be very dry so the gatorade would help.  Pt smiled and said, "you better be glad I like you", and took the cup from Clinical research associatewriter. Hall tech reports that pt drank the gatorade and is now eating snacks and was given another cup of gatorade.  Staff will continue to do neuro checks q2h until discontinued.  Pt safe at this time.

## 2018-07-20 NOTE — Progress Notes (Signed)
1:1 progress note:  Pt lying in bed with eyes closed, but awake.  Pt still endorsing suicidal thoughts and cannot contract for safety.  Pt still refusing to eat or drink even as Clinical research associatewriter offered.  Pt reminded that if CBGs continue to drop, she will be sent to the ED.  Conversation with pt was minimal, with pt only given yes or no answers.  When asked how her day was, pt remained silent.  Continued 1:1 for safety.  Sitter at bedside.  Pt safe at this time.

## 2018-07-20 NOTE — Progress Notes (Addendum)
[facility] MD Progress Note  07/20/2018 1:04 PM Veronica Waters  MRN:  [id number] Subjective: I want to get out of here.   Objective : I have reviewed chart notes, have met with patient , and have discussed case with np. 27 year old female, history of Bipolar Disorder, Borderline Personality Disorder, PTSD, who is S/P serious suicide attempt by overdose which required ICU treatment, intubation, dialysis. Today upon evaluation patient continues to remain isolated, irritable, blunted, grudgingly cooperative.  Many times throughout the evaluation patient with minimize conversation, and remains silent.  Patient unable to offer any progress update as she is visibly agitated and discontinue his communication with Probation officer at this time.  Due to ongoing agitation, unpredictable behaviors, ongoing suicidal thoughts and gestures patient remains on a one-to-one for close observation and suicidal safety.  Patient also continues to endorse chronic suicidal thoughts and is showing extreme mood lability, will place patient on unit restrictions at this time.  At the time of the evaluation patient remains on Seroquel 100 mg p.o. nightly and Lamictal 56m po daily for mood stabilization. She denies having any side effects at this time. Denies suicidal plan or intention at this time, but reports that she has chronic, intermittent suicidal ideations.  Patient continues to show and make deliberate behaviors and gestures regarding recent suicide attempt, and shows much dislike about surviving the recent attempt.  And that she is not happy that she survived recent attempt. No disruptive or agitated behaviors on unit, has not attended any groups.  Principal Problem: Bipolar Disorder, Borderline Personality Disorder, PTSD, S/P Suicide Attempt Diagnosis:   Patient Active Problem List   Diagnosis Date Noted  . Bipolar affective disorder ([facility] [F31.9] 07/17/2018  . Overdose [T50.901A] 07/14/2018  . Suicide attempt ([facility] [T14.91XA]   .  Endotracheally intubated [Z97.8]   . Posttraumatic stress disorder [F43.10]   . Borderline personality disorder ([facility] [F60.3]   . Diabetes mellitus type 2 in obese (HCC) [E11.69, E66.9]   . Bipolar 1 disorder, mixed, severe ([facility] [F31.63] 06/28/2018  . Tachycardia [R00.0] 01/18/2018  . Severe recurrent major depression without psychotic features ([facility] [F33.2] 01/05/2018  . MDD (major depressive disorder), recurrent severe, without psychosis ([facility] [F33.2] 09/01/2017   Total Time spent with patient: 20 minutes  Past Psychiatric History:   Past Medical History:  Past Medical History:  Diagnosis Date  . Asthma   . Hypertension   . Major depressive disorder    History reviewed. No pertinent surgical history. Family History:  Family History  Family history unknown: Yes   Family Psychiatric  History:  Social History:  Social History   Substance and Sexual Activity  Alcohol Use No     Social History   Substance and Sexual Activity  Drug Use No    Social History   Socioeconomic History  . Marital status: Divorced    Spouse name: Not on file  . Number of children: Not on file  . Years of education: Not on file  . Highest education level: Not on file  Occupational History  . Not on file  Social Needs  . Financial resource strain: Not on file  . Food insecurity:    Worry: Not on file    Inability: Not on file  . Transportation needs:    Medical: Not on file    Non-medical: Not on file  Tobacco Use  . Smoking status: Never Smoker  . Smokeless tobacco: Never Used  Substance and Sexual Activity  . Alcohol use: No  .  Drug use: No  . Sexual activity: Not Currently    Birth control/protection: Other-see comments    Comment: Rod in left arm for birth control  Lifestyle  . Physical activity:    Days per week: Not on file    Minutes per session: Not on file  . Stress: Not on file  Relationships  . Social connections:    Talks on phone: Not on file    Gets together:  Not on file    Attends religious service: Not on file    Active member of club or organization: Not on file    Attends meetings of clubs or organizations: Not on file    Relationship status: Not on file  Other Topics Concern  . Not on file  Social History Narrative  . Not on file   Additional Social History:   Sleep: Fair  Appetite:  Fair  Current Medications: Current Facility-Administered Medications  Medication Dose Route Frequency Provider Last Rate Last Dose  . folic acid (FOLVITE) tablet 1 mg  1 mg Oral Daily Rankin, Shuvon B, NP   1 mg at 07/20/18 0811  . hydrOXYzine (ATARAX/VISTARIL) tablet 25 mg  25 mg Oral Q6H PRN Madelin Weseman, Myer Peer, MD   25 mg at 07/19/18 2257  . lamoTRIgine (LAMICTAL) tablet 25 mg  25 mg Oral Daily Zimere Dunlevy, Myer Peer, MD   25 mg at 07/20/18 0811  . levothyroxine (SYNTHROID, LEVOTHROID) tablet 50 mcg  50 mcg Oral QAC breakfast Rankin, Shuvon B, NP   50 mcg at 07/20/18 0812  . lisinopril (PRINIVIL,ZESTRIL) tablet 10 mg  10 mg Oral Daily Lindon Romp A, NP   10 mg at 07/20/18 0813  . metFORMIN (GLUCOPHAGE) tablet 500 mg  500 mg Oral Q breakfast Manasvi Dickard, Myer Peer, MD   500 mg at 07/20/18 0811  . ondansetron (ZOFRAN-ODT) disintegrating tablet 4 mg  4 mg Oral Q8H PRN Tharon Bomar, Myer Peer, MD   4 mg at 07/19/18 1221  . QUEtiapine (SEROQUEL) tablet 100 mg  100 mg Oral QHS Martell Mcfadyen, Myer Peer, MD   100 mg at 07/19/18 2111  . thiamine (VITAMIN B-1) tablet 100 mg  100 mg Oral Daily Rankin, Shuvon B, NP   100 mg at 07/20/18 0388    Lab Results:  Results for orders placed or performed during the hospital encounter of 07/17/18 (from the past 48 hour(s))  Glucose, capillary     Status: None   Collection Time: 07/20/18  8:23 AM  Result Value Ref Range   Glucose-Capillary 80 70 - 99 mg/dL    Blood Alcohol level:  Lab Results  Component Value Date   ETH 40 (H) 07/14/2018   ETH <10 82/80/0349    Metabolic Disorder Labs: Lab Results  Component Value Date   HGBA1C 5.4  06/30/2018   MPG 108.28 06/30/2018   MPG 108.28 01/06/2018   Lab Results  Component Value Date   PROLACTIN 23.5 (H) 06/30/2018   Lab Results  Component Value Date   CHOL 230 (H) 06/30/2018   TRIG 366 (H) 06/30/2018   HDL 33 (L) 06/30/2018   CHOLHDL 7.0 06/30/2018   VLDL 73 (H) 06/30/2018   LDLCALC 124 (H) 06/30/2018   LDLCALC 158 (H) 01/06/2018    Physical Findings: AIMS: Facial and Oral Movements Muscles of Facial Expression: None, normal Lips and Perioral Area: None, normal Jaw: None, normal Tongue: None, normal,Extremity Movements Upper (arms, wrists, hands, fingers): None, normal Lower (legs, knees, ankles, toes): None, normal, Trunk Movements Neck, shoulders, hips:  None, normal, Overall Severity Severity of abnormal movements (highest score from questions above): None, normal Incapacitation due to abnormal movements: None, normal Patient's awareness of abnormal movements (rate only patient's report): No Awareness, Dental Status Current problems with teeth and/or dentures?: No Does patient usually wear dentures?: No  CIWA:    COWS:     Musculoskeletal: Strength & Muscle Tone: within normal limits Gait & Station: normal Patient leans: N/A  Psychiatric Specialty Exam: Physical Exam   ROS  denies chest pain, no shortness of breath, no vomiting , no fever, no chills   Blood pressure 123/77, pulse 99, temperature 98.3 F (36.8 C), temperature source Oral, resp. rate 20, height _0  (1.702 m), weight (!) 139.7 kg (308 lb).Body mass index is 48.24 kg/m.  General Appearance: Fairly Groomed  Eye Contact:  Fair  Speech:  Normal Rate  Volume:  Normal  Mood:  Angry, Dysphoric, Irritable and remains depressed and anxious  Affect:  Blunt, Depressed and Labile  Thought Process:  Linear and Descriptions of Associations: Intact  Orientation:  Other:  fully alert and attentive  Thought Content:  no hallucinations, no delusions  Suicidal Thoughts:  Yes.  without  intent/plan reports chronic suicidal ideations   Homicidal Thoughts:  No  Memory:  recent and remote grossly intact   Judgement:  Fair  Insight:  Fair  Psychomotor Activity:  Normal  Concentration:  Concentration: Fair and Attention Span: Fair  Recall:  [employer] of Knowledge:  Fair  Language:  Good  Akathisia:  Negative  Handed:  Right  AIMS (if indicated):     Assets:  Desire for Improvement Resilience  ADL's:  Intact  Cognition:  WNL  Sleep:  Number of Hours: 6.5   Assessment - patient remains depressed, dysphoric and vaguely irritable. She endorses chronic suicidal ideations, which she states have been present for years. She states she still would prefer to have died from recent overdose . Denies medication side effects thus far and does state Seroquel has been well tolerated and helpful in the past .   Treatment Plan Summary: Daily contact with patient to assess and evaluate symptoms and progress in treatment, Medication management, Plan inpatient treatment and medications as below Encourage group and milieu participation to work on coping skills and symptom reduction Increase Seroquel to 100 mgrs QHS for mood disorder Start Lamictal 25 mgrsQDAY for mood disorder, depression Continue Vistaril 25 mgrs Q 6 hours PRN for anxiety as needed  Continue Glucophage for DM, Lisinopril for HTN, Synthroid for hypothyroidism. Will hold Metfromin for blood glucose less than 120, as patient is not eating and maybe attempting to starve self or have hypoglycemic episode.  Treatment team working on disposition planning options   At current, she will not answer if she is suicidal and or has a plan, and is unable to contract for safety. She has a history of multiple lethal suicide attempts, impulsivity and, multiple self harm injuries (cutting), and safety continues to be an issue will remain in the hospital and on one-to-one until safe to discharge.  Patient has had several major and almost fatal  suicide attempts in the past 30 days, and attempted suicide recently after discharge and within 7 days.  At this time patient has expressed and demonstrated that she is not able to return home and remains safe despite proper medication management and treatment failure.  Will discuss with treatment team about long-term options to ensure safety for patient.  Patient may benefit from long-term residential program  for borderline personality disorder patient.  She denies any auditory or visual hallucination and does not seem to be responding to internal stimuli.  Nanci Pina, FNP    07/20/2018, 1:04 PM   ..Agree with NP Progress Note

## 2018-07-20 NOTE — [facility] Group Notes (Signed)
[facility] Group Notes:  (Nursing/MHT/Case Management/Adjunct)  Date:  07/20/2018  Time:  1530  Type of Therapy:  Psychosocial Group  Participation Level:  Did Not Attend  Patient not emotionally stable to attend.  On 1:1 observation.  Cranford MonBeaudry, Thatcher Doberstein Evans 07/20/2018, 4:18 PM

## 2018-07-20 NOTE — Progress Notes (Signed)
1:1 observation note:  Patient is observed lying in bed.  She avoids eye contact and is mute.  She is refusing to eat/drink anything.  Asked patient if she felt like harming herself and she nodded her head "yes."  Patient will remain on 1:1 because patient cannot contract for safety on the unit.  Inform MD that she is not eating or drinking.

## 2018-07-21 LAB — GLUCOSE, CAPILLARY
GLUCOSE-CAPILLARY: 80 mg/dL (ref 70–99)
GLUCOSE-CAPILLARY: 128 mg/dL — AB (ref 70–99)
GLUCOSE-CAPILLARY: 68 mg/dL — AB (ref 70–99)
Glucose-Capillary: 112 mg/dL — ABNORMAL HIGH (ref 70–99)

## 2018-07-21 MED ORDER — LORAZEPAM 1 MG PO TABS
ORAL_TABLET | ORAL | Status: AC
  Filled 2018-07-21: qty 1

## 2018-07-21 MED ORDER — PROPRANOLOL HCL 20 MG PO TABS
20.0000 mg | ORAL_TABLET | Freq: Two times a day (BID) | ORAL | Status: DC
  Administered 2018-07-21 – 2018-07-28 (×11): 20 mg via ORAL
  Filled 2018-07-21 (×11): qty 1
  Filled 2018-07-21: qty 2
  Filled 2018-07-21 (×5): qty 1

## 2018-07-21 MED ORDER — QUETIAPINE FUMARATE 200 MG PO TABS
200.0000 mg | ORAL_TABLET | Freq: Every day | ORAL | Status: DC
  Administered 2018-07-21 – 2018-07-22 (×2): 200 mg via ORAL
  Filled 2018-07-21 (×4): qty 1

## 2018-07-21 MED ORDER — LORAZEPAM 1 MG PO TABS
1.0000 mg | ORAL_TABLET | Freq: Three times a day (TID) | ORAL | Status: DC | PRN
  Administered 2018-07-21 – 2018-07-27 (×10): 1 mg via ORAL
  Filled 2018-07-21 (×10): qty 1

## 2018-07-21 NOTE — Progress Notes (Signed)
1:1 progress note:  Pt has been out in the milieu this evening.  She has been observed talking on the phone a few times.  Her interaction with peers is minimal.  Pt is still endorsing SI and cannot contract for safety with staff.  She denies HI/AVH.  Pt's CBG at the beginning of the shift was 68 and pt was given ices cream and a cup of gatorade as she has eaten minimally today.  Her recheck was 112.  Pt's VS were checked also as she has been hypertensive and tachycardic for the last couple of days.  It was still elevated this evening, and pt mentioned that she has not been getting her propranolol.  Writer checked her home meds and discovered that pt takes 20 mg of propranolol BID at home.  Writer informed evening provider who re-instated the order.  Pt was given a dose this evening so hopefully her VS will look better in the morning.  Pt has been cooperative with staff this evening.  She makes her needs known to staff.  She continues on 1:1 for safety d/t SI.  Sitter with patient at all times.  Discharge plans are in process.  Safety is maintained at this time.

## 2018-07-21 NOTE — Progress Notes (Addendum)
1:1 progress note:  Pt resting in bed with eyes closed.  No distress observed.  Respirations even and unlabored.  Continue 1:1 for safety d/t suicidal ideation and unable to contract for safety.  Sitter at bedside.  Pt remains safe.

## 2018-07-21 NOTE — Progress Notes (Signed)
1:1 observation note:  Patient has requested a "taco salad."  I informed her that outside food cannot be brought in. Patient has refused to eat any food from the cafeteria. Patient also wants to sign a 72 hour release form stating, "you can't keep me here."  Informed patient that she was aware that she could be involuntarily committed.  She asked that I go and tell Dr. Jama Flavorsobos that she wanted to sign the form.  She then asked, "what did he say?"  Informed patient that he did not comment on it and she is still aware that she can be committed. As of yet, she has not signed the 72 hour form.  She already has one that has been rescinded.

## 2018-07-21 NOTE — Progress Notes (Signed)
1:1 progress note:  Pt resting in bed with eyes closed.  No distress observed.  Respirations even and unlabored.  Continue 1:1 for safety.  Sitter at bedside.  Pt safe at this time. 

## 2018-07-21 NOTE — Progress Notes (Signed)
1:1 observation note:  Patient came to medication window this morning to get her meds.  She was given a cup of gatorade with her medications.  She took her meds with the gatorade.  She chooses to be mute today and refuses to eat.  Her last CBG was 80.  Her metformin and lisinopril was held.
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
## 2018-07-21 NOTE — Progress Notes (Signed)
1:1 observation note: Patient continues to avoid staff.  She has requested a 72 hour discharge.  Her CBGs have remained stable and she has drank some gatorade.  Patient still refuses to eat.

## 2018-07-22 LAB — GLUCOSE, CAPILLARY
GLUCOSE-CAPILLARY: 79 mg/dL (ref 70–99)
GLUCOSE-CAPILLARY: 87 mg/dL (ref 70–99)
GLUCOSE-CAPILLARY: 95 mg/dL (ref 70–99)
Glucose-Capillary: 87 mg/dL (ref 70–99)

## 2018-07-22 MED ORDER — ACETAMINOPHEN 325 MG PO TABS
650.0000 mg | ORAL_TABLET | Freq: Four times a day (QID) | ORAL | Status: DC | PRN
  Administered 2018-07-22 – 2018-07-26 (×3): 650 mg via ORAL
  Filled 2018-07-22 (×3): qty 2
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
## 2018-07-22 NOTE — Progress Notes (Signed)
Patient refused to fill out self inventory sheet. Patient was resting in bed with eyes closed upon approach this morning. Patient did not get up for vital signs and had to be coaxed out of bed for morning medications. Patient eventually got up and shrugged her shoulders when writer asked if she was feeling suicidal this today. Could not contract for safety. Requires 1:1 for safety reasons.  Patient compliant with medications prescribed per provider. Safety maintained with 1:1 monitoring, 15 minute checks, as well as environmental checks. Will continue to monitor.

## 2018-07-22 NOTE — Progress Notes (Signed)
Recreation Therapy Notes  Animal-Assisted Activity (AAA) Program Checklist/Progress Notes Patient Eligibility Criteria Checklist & Daily Group note for Rec Tx Intervention  Date: 7.30.19 Time: 1430 Location: [address] Dayroom   AAA/T Program Assumption of Risk Form signed by Patient/ or Parent Legal Guardian YES   Patient is free of allergies or sever asthma YES  Patient reports no fear of animals  YES  Patient reports no history of cruelty to animals  YES  Patient understands his/her participation is voluntary  YES   Patient washes hands before animal contact  YES   Patient washes hands after animal contact  YES   Education: Hand Washing, Appropriate Animal Interaction   Education Outcome: Acknowledges understanding/In group clarification offered/Needs additional education.   Clinical Observations/Feedback: Pt did not attend group.    Nikisha Fleece, LRT/CTRS         Necola Bluestein A 07/22/2018 3:57 PM 

## 2018-07-22 NOTE — Progress Notes (Signed)
1:1 progress note:  Pt resting in bed with eyes closed.  No distress observed.  Respirations even and unlabored.  Continue 1:1 for safety.  Sitter at bedside.  Pt remains safe. 
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
## 2018-07-22 NOTE — Progress Notes (Signed)
1:1 progress note:  Pt is lying in bed with eyes closed.  No distress observed.  Respirations even and unlabored.  Continue 1:1 d/t suicidal ideation.  Pt will not contract for safety.  Sitter at bedside.  Pt is safe at this time.

## 2018-07-22 NOTE — Progress Notes (Addendum)
1:1 progress note: Pt resting in bed with eyes open. No distress observed. Respirations even and unlabored. Continue 1:1 for safety. Sitter at bedside. Pt remains safe.

## 2018-07-22 NOTE — Progress Notes (Signed)
1:1 progress note: Pt sitting in day room watching tv with no interaction with anyone. No distress observed. Respirations even and unlabored. Continue 1:1 for safety. Sitter at bedside. Pt remains safe.

## 2018-07-22 NOTE — Progress Notes (Signed)
Film/video editoritter gave writer a note stating that when pt went to the bathroom before bedtime, she told the sitter that she was going to use the shower curtain to kill herself because it was the only useful thing in the room.  Sitter said she told pt that she was there with her to prevent any actions that the pt would do to harm herself.  Sitter said that the patient did not act on her thoughts, but only voiced her thoughts.

## 2018-07-22 NOTE — Progress Notes (Addendum)
1:1 progress note:  Pt resting in bed with eyes closed.  No distress observed.  Respirations even and unlabored.  Continue 1:1 for safety.  Sitter at bedside.  Pt remains safe. 

## 2018-07-22 NOTE — Plan of Care (Signed)
  Problem: Education: Goal: Utilization of techniques to improve thought processes will improve Outcome: Not Progressing   Problem: Education: Goal: Knowledge of the prescribed therapeutic regimen will improve Outcome: Not Progressing   Problem: Activity: Goal: Interest or engagement in leisure activities will improve Outcome: Not Progressing   Problem: Coping: Goal: Coping ability will improve Outcome: Not Progressing

## 2018-07-22 NOTE — [facility] Group Notes (Signed)
[facility] Group Notes:  (Nursing/MHT/Case Management/Adjunct)  Date:  07/22/2018  Time:  4:00 PM  Type of Therapy:  Nurse Education  Participation Level:  Did Not Attend  Summary of Progress/Problems: Patient was invited but did not attend.  Scotty Weigelt 07/22/2018, 4:59 PM   

## 2018-07-23 LAB — GLUCOSE, CAPILLARY
GLUCOSE-CAPILLARY: 87 mg/dL (ref 70–99)
GLUCOSE-CAPILLARY: 88 mg/dL (ref 70–99)
Glucose-Capillary: 83 mg/dL (ref 70–99)

## 2018-07-23 MED ORDER — LAMOTRIGINE 25 MG PO TABS
50.0000 mg | ORAL_TABLET | Freq: Every day | ORAL | Status: DC
  Administered 2018-07-24 – 2018-07-25 (×2): 50 mg via ORAL
  Filled 2018-07-23 (×3): qty 2

## 2018-07-23 MED ORDER — QUETIAPINE FUMARATE 300 MG PO TABS
300.0000 mg | ORAL_TABLET | Freq: Every day | ORAL | Status: DC
  Administered 2018-07-23 – 2018-07-24 (×2): 300 mg via ORAL
  Filled 2018-07-23 (×4): qty 1
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
## 2018-07-23 NOTE — Progress Notes (Signed)
1:1 discontinuation note: Patient is resting in bed with eyes closed. Respirations are even and unlabored. Patient is in no distress. Patient has no concerns. Will continue to monitor.  

## 2018-07-23 NOTE — Progress Notes (Signed)
Nursing 1:1 Note  D: Pt observed sleeping in bed with eyes closed. Respirations even and unlabored. No distress noted.Will continue with POC.   A: 1:1 observation continues for safety. Sitter within arms reach.  R: Pt remains safe.    

## 2018-07-23 NOTE — Progress Notes (Signed)
Patient has stayed in bed all day. Did not eat breakfast, lunch, or dinner. Patient refused 5:00 vital signs as well as her pm dose of propanolol. Patient was essentially mute and would not uncover her face from the blanket.

## 2018-07-23 NOTE — Progress Notes (Addendum)
1:1 discontinuation note: Patient is resting in bed with eyes closed. Respirations are even and unlabored. Patient is in no distress. Patient has no concerns. Will continue to monitor.

## 2018-07-23 NOTE — Progress Notes (Signed)
Nursing 1:1 Note  D: Pt observed sleeping in bed with eyes closed. Respirations even and unlabored. No distress noted.Will continue with POC.   A: 1:1 observation continues for safety. Sitter within arms reach.  R: Pt remains safe.

## 2018-07-23 NOTE — Tx Team (Addendum)
Interdisciplinary Treatment and Diagnostic Plan Update  07/23/2018 Time of Session: 1041 Veronica Waters MRN: [id number]  Principal Diagnosis: <principal problem not specified>  Secondary Diagnoses: Active Problems:   Bipolar affective disorder (HCC)   Current Medications:  Current Facility-Administered Medications  Medication Dose Route Frequency Provider Last Rate Last Dose  . acetaminophen (TYLENOL) tablet 650 mg  650 mg Oral Q6H PRN Nira Conn A, NP   650 mg at 07/22/18 2212  . folic acid (FOLVITE) tablet 1 mg  1 mg Oral Daily Rankin, Shuvon B, NP   1 mg at 07/23/18 0803  . hydrOXYzine (ATARAX/VISTARIL) tablet 25 mg  25 mg Oral Q6H PRN Cobos, Rockey Situ, MD   25 mg at 07/21/18 2127  . [START ON 07/24/2018] lamoTRIgine (LAMICTAL) tablet 50 mg  50 mg Oral Daily Antonieta Pert, MD      . levothyroxine (SYNTHROID, LEVOTHROID) tablet 50 mcg  50 mcg Oral QAC breakfast Rankin, Shuvon B, NP   50 mcg at 07/23/18 0602  . lisinopril (PRINIVIL,ZESTRIL) tablet 10 mg  10 mg Oral Daily Nira Conn A, NP   10 mg at 07/23/18 0803  . LORazepam (ATIVAN) tablet 1 mg  1 mg Oral Q8H PRN Cobos, Rockey Situ, MD   1 mg at 07/22/18 2102  . metFORMIN (GLUCOPHAGE) tablet 500 mg  500 mg Oral Q breakfast Truman Hayward, FNP   500 mg at 07/23/18 0865  . ondansetron (ZOFRAN-ODT) disintegrating tablet 4 mg  4 mg Oral Q8H PRN Cobos, Rockey Situ, MD   4 mg at 07/19/18 1221  . propranolol (INDERAL) tablet 20 mg  20 mg Oral BID Nira Conn A, NP   20 mg at 07/23/18 0803  . QUEtiapine (SEROQUEL) tablet 300 mg  300 mg Oral QHS Antonieta Pert, MD      . thiamine (VITAMIN B-1) tablet 100 mg  100 mg Oral Daily Rankin, Shuvon B, NP   100 mg at 07/23/18 0803   PTA Medications: Medications Prior to Admission  Medication Sig Dispense Refill Last Dose  . albuterol (PROVENTIL HFA;VENTOLIN HFA) 108 (90 Base) MCG/ACT inhaler Inhale 1-2 puffs into the lungs every 6 (six) hours as needed for wheezing or shortness of breath. 1  Inhaler 0 unknown  . folic acid (FOLVITE) 1 MG tablet Take 1 tablet (1 mg total) by mouth daily.     . furosemide (LASIX) 20 MG tablet Take 1 tablet (20 mg total) by mouth daily for 3 days. 3 tablet    . levothyroxine (SYNTHROID, LEVOTHROID) 50 MCG tablet Take 1 tablet (50 mcg total) by mouth daily before breakfast. 30 tablet 0 unknown  . lisinopril (PRINIVIL,ZESTRIL) 10 MG tablet Take 1 tablet (10 mg total) by mouth daily. 30 tablet 0 unknown  . Melatonin 3 MG TABS Take 3 mg by mouth daily as needed.   unknown  . metFORMIN (GLUCOPHAGE-XR) 500 MG 24 hr tablet Take 500 mg by mouth daily with breakfast.   unknown  . naltrexone (DEPADE) 50 MG tablet Take 25 mg by mouth daily.   unknow  . potassium chloride SA (K-DUR,KLOR-CON) 20 MEQ tablet Take 1 tablet (20 mEq total) by mouth daily for 3 days. 3 tablet 0   . propranolol (INDERAL) 20 MG tablet Take 1 tablet (20 mg total) by mouth 2 (two) times daily. 60 tablet 0 unknown  . thiamine 100 MG tablet Take 1 tablet (100 mg total) by mouth daily.       Patient Stressors: Marital or family conflict Medication change  or noncompliance Occupational concerns  Patient Strengths: Ability for insight Average or above average intelligence Capable of independent living General fund of knowledge  Treatment Modalities: Medication Management, Group therapy, Case management,  1 to 1 session with clinician, Psychoeducation, Recreational therapy.   Physician Treatment Plan for Primary Diagnosis: <principal problem not specified> Long Term Goal(s): Improvement in symptoms so as ready for discharge Improvement in symptoms so as ready for discharge   Short Term Goals: Ability to identify changes in lifestyle to reduce recurrence of condition will improve Ability to maintain clinical measurements within normal limits will improve Ability to verbalize feelings will improve Ability to disclose and discuss suicidal ideas Ability to demonstrate self-control will  improve Ability to identify and develop effective coping behaviors will improve Ability to maintain clinical measurements within normal limits will improve  Medication Management: Evaluate patient's response, side effects, and tolerance of medication regimen.  Therapeutic Interventions: 1 to 1 sessions, Unit Group sessions and Medication administration.  Evaluation of Outcomes: Progressing  Physician Treatment Plan for Secondary Diagnosis: Active Problems:   Bipolar affective disorder (HCC)  Long Term Goal(s): Improvement in symptoms so as ready for discharge Improvement in symptoms so as ready for discharge   Short Term Goals: Ability to identify changes in lifestyle to reduce recurrence of condition will improve Ability to maintain clinical measurements within normal limits will improve Ability to verbalize feelings will improve Ability to disclose and discuss suicidal ideas Ability to demonstrate self-control will improve Ability to identify and develop effective coping behaviors will improve Ability to maintain clinical measurements within normal limits will improve     Medication Management: Evaluate patient's response, side effects, and tolerance of medication regimen.  Therapeutic Interventions: 1 to 1 sessions, Unit Group sessions and Medication administration.  Evaluation of Outcomes: Progressing   RN Treatment Plan for Primary Diagnosis: <principal problem not specified> Long Term Goal(s): Knowledge of disease and therapeutic regimen to maintain health will improve  Short Term Goals: Ability to identify and develop effective coping behaviors will improve and Compliance with prescribed medications will improve  Medication Management: RN will administer medications as ordered by provider, will assess and evaluate patient's response and provide education to patient for prescribed medication. RN will report any adverse and/or side effects to prescribing  provider.  Therapeutic Interventions: 1 on 1 counseling sessions, Psychoeducation, Medication administration, Evaluate responses to treatment, Monitor vital signs and CBGs as ordered, Perform/monitor CIWA, COWS, AIMS and Fall Risk screenings as ordered, Perform wound care treatments as ordered.  Evaluation of Outcomes: Progressing   LCSW Treatment Plan for Primary Diagnosis: <principal problem not specified> Long Term Goal(s): Safe transition to appropriate next level of care at discharge, Engage patient in therapeutic group addressing interpersonal concerns.  Short Term Goals: Engage patient in aftercare planning with referrals and resources and Increase social support  Therapeutic Interventions: Assess for all discharge needs, 1 to 1 time with Social worker, Explore available resources and support systems, Assess for adequacy in community support network, Educate family and significant other(s) on suicide prevention, Complete Psychosocial Assessment, Interpersonal group therapy.  Evaluation of Outcomes: Progressing   Progress in Treatment: Attending groups: No. Participating in groups: No. Taking medication as prescribed: Yes. Toleration medication: Yes. Family/Significant other contact made: No, will contact:  patient refused consent Patient understands diagnosis: Yes. Discussing patient identified problems/goals with staff: Yes. Medical problems stabilized or resolved: Yes. Denies suicidal/homicidal ideation: Yes. Issues/concerns per patient self-inventory: No. Other: none  New problem(s) identified: No, Describe:  none  New Short Term/Long Term Goal(s): medication stabilization, elimination of SI thoughts, development of comprehensive mental wellness plan.    Patient Goals:  "To be back on my meds the right way."  Discharge Plan or Barriers: CSW will continue to assess for appropriate referrals and discharge planning.   Reason for Continuation of Hospitalization:  Depression Medication stabilization  Estimated Length of Stay: 3-5 days.  Attendees: Patient: Veronica Waters 07/23/2018   Physician: Dr. Jama Flavorsobos, MD; Dr. Landry MellowGreg Clary, MD 07/23/2018   Nursing: Roddie McElizabeth Awofadeju, RN; Arlyss RepressAlyssa, RN 07/23/2018   RN Care Manager: 07/23/2018   Social Worker: Daleen SquibbGreg Wierda, LCSW; Baldo DaubJolan Marquel Spoto, LCSWA 07/23/2018   Recreational Therapist: X 07/23/2018   Other: X 07/23/2018   Other: X 07/23/2018   Other:X 07/23/2018     Scribe for Treatment Team: Maeola SarahJolan E Aliyyah Riese, LCSWA 07/23/2018 10:45 AM

## 2018-07-23 NOTE — Plan of Care (Signed)
Patient self inventory- Patient slept fair last night, sleep medication was requested and it was helpful. Appetite has been poor. Energy level poor and concentration poor. Patient rates depression and hopelessness at 10 out of 10 and anxiety at 9 out of 10. Patient endorses Si. Denies HI AVH. Denies physical problems and physical pain. Patient's goal is "to get better and work on a discharge plan." Patient also wrote "just not being happy and feeling like I will end up in the same spot."  Patient compliant with medications prescribed per provider. Safety is maintained with 15 minute checks. 1:1 safety sitter has been d/c per Md. Will continue to monitor.  Problem: Education: Goal: Utilization of techniques to improve thought processes will improve Outcome: Progressing   Problem: Education: Goal: Knowledge of the prescribed therapeutic regimen will improve Outcome: Progressing   Problem: Coping: Goal: Will verbalize feelings Outcome: Progressing

## 2018-07-23 NOTE — Progress Notes (Signed)
1:1 order has been discontinued per MD. Patient has been "behaving" last night and this morning. Patient could not answer if she was suicidal, but verbally contracted for safety with Clinical research associatewriter. Patient is currently in her room lying in bed with eyes open. Respirations are even and unlabored. Patient is in no distress. Will continue to monitor.

## 2018-07-23 NOTE — Progress Notes (Signed)
Recreation Therapy Notes  Date: 7.31.19 Time: 0930 Location: [address] Dayroom  Group Topic: Stress Management  Goal Area(s) Addresses:  Patient will verbalize importance of using healthy stress management.  Patient will identify positive emotions associated with healthy stress management.   Intervention: Stress Management  Activity :  Guided Imagery.  LRT introduced the stress management technique of guided imagery.  LRT read a script about peaceful waves.  Patients were to follow along as LRT read script to relax and envision being on the beach.  Education:  Stress Management, Discharge Planning.   Education Outcome: Acknowledges edcuation/In group clarification offered/Needs additional education  Clinical Observations/Feedback:  Pt did not attend group.      Addilynn Mowrer, LRT/CTRS         Gianni Mihalik A 07/23/2018 10:44 AM 

## 2018-07-24 LAB — GLUCOSE, CAPILLARY
GLUCOSE-CAPILLARY: 100 mg/dL — AB (ref 70–99)
GLUCOSE-CAPILLARY: 86 mg/dL (ref 70–99)
GLUCOSE-CAPILLARY: 93 mg/dL (ref 70–99)

## 2018-07-24 MED ORDER — LITHIUM CARBONATE 150 MG PO CAPS
150.0000 mg | ORAL_CAPSULE | Freq: Once | ORAL | Status: AC
  Administered 2018-07-24: 150 mg via ORAL
  Filled 2018-07-24: qty 1

## 2018-07-24 MED ORDER — LITHIUM CARBONATE 150 MG PO CAPS
150.0000 mg | ORAL_CAPSULE | Freq: Every day | ORAL | Status: DC
  Administered 2018-07-24: 150 mg via ORAL
  Filled 2018-07-24 (×4): qty 1
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
## 2018-07-24 NOTE — Progress Notes (Signed)
Pt observedresting in bed with eyesopen. Pt appears flat/irritable/agitated in affect and mood. Pt electively mutes and/or shrugs shoulder during assessment.Pt exhibit poor eye contact with Clinical research associatewriter.Pt endorses passive SI but was able to verbal contract for safety.Pt denies HI/AVH/Pain at this time.CBG at bedtime was 93. Pt remains to display attention-seeking behaviors and preoccupied with discharge. Lithium was added at bedtime. Initially, Pt refused all bedtime meds. However, Pt later took them. Pt continues to need lots of encouragement from staff. PRN ativan requested and given. Will continue with POC

## 2018-07-24 NOTE — Plan of Care (Signed)
Problem: Health Behavior/Discharge Planning: Goal: Compliance with treatment plan for underlying cause of condition will improve Intervention: Patient encouraged to take medications as prescribed and attend groups. Patient encouraged to be active in their recovery. Outcome: Patient is attending groups, taking medications as prescribed and complying with goals of treatment. 07/24/2018 3:19 PM - Progressing by Ferrel Loganollazo, Chyrel Taha A, RN   Problem: Safety: Goal: Periods of time without injury will increase Intervention: Patient contracts for safety on the unit. Low fall risk precautions in place. Safety monitored with q15 minute checks. Outcome: Patient remains safe on the unit at this time. 07/24/2018 3:19 PM - Progressing by Ferrel Loganollazo, Jarelis Ehlert A, RN

## 2018-07-24 NOTE — Progress Notes (Signed)
Patient ID: Veronica Waters, female   DOB: [birth year], 27 y.o.   MRN: [id number]  Nursing Progress Note [id number]  Data: Patient presents with labile mood and anxiety. Patient appears flat and depressed but complains of irritability and agitation. Patient provided scheduled and PRN medications as ordered. Patient completed self-inventory sheet and rates depression, hopelessness, and anxiety 10,10,10 respectively. Patient rates their sleep and appetite as fair/poor respectively. Patient states goal for today is to "go to groups". Patient reports she is, "trying not to go off but it's stressful". Patient currently denies SI/HI/AVH.   Action: Patient educated about and provided medication per provider's orders. Patient safety maintained with q15 min safety checks and frequent rounding. Low fall risk precautions in place. Emotional support given. 1:1 interaction and active listening provided. Patient encouraged to attend meals and groups. Patient encouraged to work on treatment plan and goals. Labs, vital signs and patient behavior monitored throughout shift.   Response: Patient agrees to come to staff if any thoughts of SI/HI develop or if patient develops intention of acting on thoughts. Patient remains safe on the unit at this time. Patient is interacting with peers appropriately on the unit. Will continue to support and monitor.
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
## 2018-07-25 LAB — GLUCOSE, CAPILLARY
GLUCOSE-CAPILLARY: 127 mg/dL — AB (ref 70–99)
Glucose-Capillary: 104 mg/dL — ABNORMAL HIGH (ref 70–99)
Glucose-Capillary: 98 mg/dL (ref 70–99)
Glucose-Capillary: 100 mg/dL — ABNORMAL HIGH (ref 70–99)

## 2018-07-25 MED ORDER — LITHIUM CARBONATE 300 MG PO CAPS
300.0000 mg | ORAL_CAPSULE | Freq: Every day | ORAL | Status: DC
  Administered 2018-07-25: 300 mg via ORAL
  Filled 2018-07-25 (×3): qty 1

## 2018-07-25 MED ORDER — QUETIAPINE FUMARATE 400 MG PO TABS
400.0000 mg | ORAL_TABLET | Freq: Every day | ORAL | Status: DC
  Administered 2018-07-25 – 2018-07-27 (×3): 400 mg via ORAL
  Filled 2018-07-25 (×4): qty 1
  Filled 2018-07-25: qty 7

## 2018-07-25 NOTE — Progress Notes (Signed)
Pt did not attend wrap-up group   

## 2018-07-25 NOTE — Progress Notes (Signed)
Adult Psychoeducational Group Note   Date:  07/25/2018  Time: 4:00 PM   Group Topic/Focus: Music as a Coping Skill Patients choose a song of significance to and explain to the group how the song has helped with their recovery.   Participation Level:  Minimal   Participation Quality:  Poor   Affect:  Flat   Cognitive:  Alert and Oriented   Insight: Limited   Engagement in Group:  Developing/Improving   Modes of Intervention:  Discussion, Activity, and Education   Additional Comments:  Patient was attended group late and did not contribute to the discussion.   Marchelle Folksmanda A Nusrat Encarnacion 07/25/2018 5:00 PM

## 2018-07-25 NOTE — Progress Notes (Signed)
D: Patient endorses passive SI stating, "the thoughts are always there" but verbally contracts for safety.  She denies HI or AVH at this time. Patient presents as flat and depressed stating she wants to be discharged.  Pt. States she got "some" sleep last night and she denies any physical complaints.  Pt. Reports her appetite as "not great".  Pt. Visualized on phone in the hallway but otherwise isolates to her room.  A: Patient given emotional support from RN. Patient encouraged to come to staff with concerns and/or questions. Patient's medication routine continued. Patient's orders and plan of care reviewed.   R: Patient remains appropriate and cooperative. Will continue to monitor patient q15 minutes for safety.

## 2018-07-25 NOTE — Progress Notes (Signed)
[facility] MD Progress Note  07/25/2018 12:30 PM Veronica Waters  MRN:  [id number] Subjective: Patient is seen and examined.  Patient is a 27 year old female with a past psychiatric history significant for borderline personality disorder, posttraumatic stress disorder as well as bipolar disorder.  She is seen in follow-up.  She states she feels more depressed today.  She stated that she did not sleep well last night.  She got aggravated last night.  Her blood sugar this morning is stable.  She stated that even with the addition of the lithium as well as the Seroquel she did not sleep.  She continues to have suicidal ideation.  We discussed increasing her lithium dose, stopping the Lamictal, and trying to increase the Seroquel.  She is in agreement with that. Principal Problem: <principal problem not specified> Diagnosis:   Patient Active Problem List   Diagnosis Date Noted  . Bipolar affective disorder (HCC) [F31.9] 07/17/2018  . Overdose [T50.901A] 07/14/2018  . Suicide attempt (HCC) [T14.91XA]   . Endotracheally intubated [Z97.8]   . Posttraumatic stress disorder [F43.10]   . Borderline personality disorder (HCC) [F60.3]   . Diabetes mellitus type 2 in obese (HCC) [E11.69, E66.9]   . Bipolar 1 disorder, mixed, severe (HCC) [F31.63] 06/28/2018  . Tachycardia [R00.0] 01/18/2018  . Severe recurrent major depression without psychotic features (HCC) [F33.2] 01/05/2018  . MDD (major depressive disorder), recurrent severe, without psychosis (HCC) [F33.2] 09/01/2017   Total Time spent with patient: 15 minutes  Past Psychiatric History: See admission H&P  Past Medical History:  Past Medical History:  Diagnosis Date  . Asthma   . Hypertension   . Major depressive disorder    History reviewed. No pertinent surgical history. Family History:  Family History  Family history unknown: Yes   Family Psychiatric  History: See admission H&P Social History:  Social History   Substance and Sexual Activity   Alcohol Use No     Social History   Substance and Sexual Activity  Drug Use No    Social History   Socioeconomic History  . Marital status: Divorced    Spouse name: Not on file  . Number of children: Not on file  . Years of education: Not on file  . Highest education level: Not on file  Occupational History  . Not on file  Social Needs  . Financial resource strain: Not on file  . Food insecurity:    Worry: Not on file    Inability: Not on file  . Transportation needs:    Medical: Not on file    Non-medical: Not on file  Tobacco Use  . Smoking status: Never Smoker  . Smokeless tobacco: Never Used  Substance and Sexual Activity  . Alcohol use: No  . Drug use: No  . Sexual activity: Not Currently    Birth control/protection: Other-see comments    Comment: Rod in left arm for birth control  Lifestyle  . Physical activity:    Days per week: Not on file    Minutes per session: Not on file  . Stress: Not on file  Relationships  . Social connections:    Talks on phone: Not on file    Gets together: Not on file    Attends religious service: Not on file    Active member of club or organization: Not on file    Attends meetings of clubs or organizations: Not on file    Relationship status: Not on file  Other Topics Concern  . Not  on file  Social History Narrative  . Not on file   Additional Social History:                         Sleep: Poor  Appetite:  Fair  Current Medications: Current Facility-Administered Medications  Medication Dose Route Frequency Provider Last Rate Last Dose  . acetaminophen (TYLENOL) tablet 650 mg  650 mg Oral Q6H PRN Nira Conn A, NP   650 mg at 07/23/18 2120  . folic acid (FOLVITE) tablet 1 mg  1 mg Oral Daily Rankin, Shuvon B, NP   1 mg at 07/25/18 0829  . hydrOXYzine (ATARAX/VISTARIL) tablet 25 mg  25 mg Oral Q6H PRN Cobos, Rockey Situ, MD   25 mg at 07/25/18 0522  . levothyroxine (SYNTHROID, LEVOTHROID) tablet 50 mcg  50  mcg Oral QAC breakfast Rankin, Shuvon B, NP   50 mcg at 07/25/18 0513  . lisinopril (PRINIVIL,ZESTRIL) tablet 10 mg  10 mg Oral Daily Nira Conn A, NP   10 mg at 07/25/18 0828  . lithium carbonate capsule 300 mg  300 mg Oral QHS Antonieta Pert, MD      . LORazepam (ATIVAN) tablet 1 mg  1 mg Oral Q8H PRN Cobos, Rockey Situ, MD   1 mg at 07/25/18 1100  . metFORMIN (GLUCOPHAGE) tablet 500 mg  500 mg Oral Q breakfast Truman Hayward, FNP   500 mg at 07/25/18 6045  . ondansetron (ZOFRAN-ODT) disintegrating tablet 4 mg  4 mg Oral Q8H PRN Cobos, Rockey Situ, MD   4 mg at 07/23/18 1150  . propranolol (INDERAL) tablet 20 mg  20 mg Oral BID Nira Conn A, NP   20 mg at 07/25/18 0829  . QUEtiapine (SEROQUEL) tablet 400 mg  400 mg Oral QHS Antonieta Pert, MD      . thiamine (VITAMIN B-1) tablet 100 mg  100 mg Oral Daily Rankin, Shuvon B, NP   100 mg at 07/25/18 4098    Lab Results:  Results for orders placed or performed during the hospital encounter of 07/17/18 (from the past 48 hour(s))  Glucose, capillary     Status: None   Collection Time: 07/23/18  8:22 PM  Result Value Ref Range   Glucose-Capillary 83 70 - 99 mg/dL  Glucose, capillary     Status: Abnormal   Collection Time: 07/24/18  6:17 AM  Result Value Ref Range   Glucose-Capillary 100 (H) 70 - 99 mg/dL  Glucose, capillary     Status: None   Collection Time: 07/24/18 12:06 PM  Result Value Ref Range   Glucose-Capillary 86 70 - 99 mg/dL  Glucose, capillary     Status: None   Collection Time: 07/24/18  8:34 PM  Result Value Ref Range   Glucose-Capillary 93 70 - 99 mg/dL  Glucose, capillary     Status: Abnormal   Collection Time: 07/25/18  5:19 AM  Result Value Ref Range   Glucose-Capillary 127 (H) 70 - 99 mg/dL  Glucose, capillary     Status: Abnormal   Collection Time: 07/25/18 12:06 PM  Result Value Ref Range   Glucose-Capillary 104 (H) 70 - 99 mg/dL   Comment 1 Notify RN     Blood Alcohol level:  Lab Results   Component Value Date   ETH 40 (H) 07/14/2018   ETH <10 01/18/2018    Metabolic Disorder Labs: Lab Results  Component Value Date   HGBA1C 5.4 06/30/2018   MPG 108.28  06/30/2018   MPG 108.28 01/06/2018   Lab Results  Component Value Date   PROLACTIN 23.5 (H) 06/30/2018   Lab Results  Component Value Date   CHOL 230 (H) 06/30/2018   TRIG 366 (H) 06/30/2018   HDL 33 (L) 06/30/2018   CHOLHDL 7.0 06/30/2018   VLDL 73 (H) 06/30/2018   LDLCALC 124 (H) 06/30/2018   LDLCALC 158 (H) 01/06/2018    Physical Findings: AIMS: Facial and Oral Movements Muscles of Facial Expression: None, normal Lips and Perioral Area: None, normal Jaw: None, normal Tongue: None, normal,Extremity Movements Upper (arms, wrists, hands, fingers): None, normal Lower (legs, knees, ankles, toes): None, normal, Trunk Movements Neck, shoulders, hips: None, normal, Overall Severity Severity of abnormal movements (highest score from questions above): None, normal Incapacitation due to abnormal movements: None, normal Patient's awareness of abnormal movements (rate only patient's report): No Awareness, Dental Status Current problems with teeth and/or dentures?: No Does patient usually wear dentures?: No  CIWA:    COWS:     Musculoskeletal: Strength & Muscle Tone: within normal limits Gait & Station: normal Patient leans: N/A  Psychiatric Specialty Exam: Physical Exam  Nursing note and vitals reviewed. Constitutional: She is oriented to person, place, and time. She appears well-developed and well-nourished.  HENT:  Head: Normocephalic and atraumatic.  Respiratory: Effort normal.  Neurological: She is alert and oriented to person, place, and time.    ROS  Blood pressure 112/80, pulse (!) 113, temperature 97.6 F (36.4 C), temperature source Oral, resp. rate 16, height 5\' 7"  (1.702 m), weight (!) 139.7 kg (308 lb).Body mass index is 48.24 kg/m.  General Appearance: Disheveled  Eye Contact:  Fair   Speech:  Normal Rate  Volume:  Normal  Mood:  Anxious, Depressed, Dysphoric and Irritable  Affect:  Congruent  Thought Process:  Coherent  Orientation:  Full (Time, Place, and Person)  Thought Content:  Logical  Suicidal Thoughts:  Yes.  without intent/plan  Homicidal Thoughts:  No  Memory:  Immediate;   Fair Recent;   Fair Remote;   Fair  Judgement:  Impaired  Insight:  Fair  Psychomotor Activity:  Decreased  Concentration:  Concentration: Fair and Attention Span: Fair  Recall:  [employer]Fair  Fund of Knowledge:  Fair  Language:  Fair  Akathisia:  Negative  Handed:  Right  AIMS (if indicated):     Assets:  Communication Skills Desire for Improvement Housing Resilience  ADL's:  Intact  Cognition:  WNL  Sleep:  Number of Hours: 5.75     Treatment Plan Summary: Daily contact with patient to assess and evaluate symptoms and progress in treatment, Medication management and Plan : Patient is seen and examined.  Patient is a 27 year old female with the above-stated past psychiatric history seen in follow-up.  She is still not doing well.  She is still not sleeping.  She actually feels like her depression is worse today.  I am going to increase her lithium carbonate to 300 mg p.o. nightly.  I am going to stop the Lamictal.  I am also going to increase her Seroquel to stabilize her mood as well as assist with sleep.  No other changes in her current medications.  Antonieta PertGreg Lawson Tijuan Dantes, MD 07/25/2018, 12:30 PM

## 2018-07-25 NOTE — Progress Notes (Signed)
D.  Pt in room in bed on approach, requested night medications.  Pt stated that she was feeling aggravated but did not elaborate.  Pt did not feel well enough to attend evening wrap up group.  Minimal interaction on unit.  Pt does continue to report passive SI but verbally stated she will not harm herself on the unit.  Pt denies HI/AVH at this time.  A.  Support and encouragement offered, medication given as ordered  R.  Pt remains safe on the unit, will continue to monitor.

## 2018-07-25 NOTE — Progress Notes (Signed)
Recreation Therapy Notes  Date: 8.2.19 Time: 0930 Location: 300 [address] Dayroom  Group Topic: Stress Management  Goal Area(s) Addresses:  Patient will verbalize importance of using healthy stress management.  Patient will identify positive emotions associated with healthy stress management.   Intervention: Stress Management  Activity : Meditation.  LRT introduced the stress management technique of meditation.  LRT played a meditation on gratitude.  Patients were to follow along as meditation played to engage in activity.  Education:  Stress Management, Discharge Planning.   Education Outcome: Acknowledges edcuation/In group clarification offered/Needs additional education  Clinical Observations/Feedback: Pt did not attend group.     Caroll RancherMarjette Donzella Carrol, LRT/CTRS         Lillia AbedLindsay, Janelys Glassner A 07/25/2018 11:02 AM

## 2018-07-26 LAB — GLUCOSE, CAPILLARY: Glucose-Capillary: 104 mg/dL — ABNORMAL HIGH (ref 70–99)

## 2018-07-26 MED ORDER — VORTIOXETINE HBR 5 MG PO TABS
5.0000 mg | ORAL_TABLET | Freq: Every day | ORAL | Status: DC
  Filled 2018-07-26 (×5): qty 1

## 2018-07-26 MED ORDER — LITHIUM CARBONATE 300 MG PO CAPS
600.0000 mg | ORAL_CAPSULE | Freq: Every day | ORAL | Status: DC
  Administered 2018-07-26 – 2018-07-27 (×2): 600 mg via ORAL
  Filled 2018-07-26: qty 2
  Filled 2018-07-26: qty 14
  Filled 2018-07-26 (×3): qty 2

## 2018-07-26 NOTE — Progress Notes (Signed)
Patient ID: Veronica Waters, female   DOB: Feb 26, 1991, 26 y.o.   MRN: [id number]    D: Pt has been very withdrawn today on the unit, she did not attend any groups nor did she engage in any treatment. Pt refused all medication, Dr. Mallie Darting made aware. Dr. Mallie Darting met with patient, changes noted to patients medication regimen. Pt refused to fill out her patient self inventory sheet, she refused to talk to all nurses on the unit. Pt reported that she just wanted to be discharged. Pt reported being negative SI/HI, no AH/VH noted. A: 15 min checks continued for patient safety. R: Pt safety maintained.

## 2018-07-26 NOTE — Progress Notes (Signed)
D.  Pt has been up and brighter this evening, was seen once on phone engaged in confrontation about car.  Pt went to room and was in bed after confrontation.  She did agree to take her evening medication and got up at that time and went back to dayroom.  Complaint of left hip pain that Pt states is from "laying in bed for nine days".  Pt states "I won't do that again, I learned my lesson".  Pt endorses passive SI at times but does verbally agree to safety on the unit.  She denies HI/AVH at this time.  A.  Support and encouragement offered, medication given as ordered  R.  Pt remains safe on the unit, will continue to monitor.

## 2018-07-26 NOTE — [facility] Group Notes (Signed)
LCSW Group Therapy Note  07/26/2018   10:00--11:00am   Type of Therapy and Topic:  Group Therapy: Anger Cues and Responses  Participation Level:  Did Not Attend   Description of Group:   In this group, patients learned how to recognize the physical, cognitive, emotional, and behavioral responses they have to anger-provoking situations.  They identified a recent time they became angry and how they reacted.  They analyzed how their reaction was possibly beneficial and how it was possibly unhelpful.  The group discussed a variety of healthier coping skills that could help with such a situation in the future.  Deep breathing was practiced briefly.  Therapeutic Goals: 1. Patients will remember their last incident of anger and how they felt emotionally and physically, what their thoughts were at the time, and how they behaved. 2. Patients will identify how their behavior at that time worked for them, as well as how it worked against them. 3. Patients will explore possible new behaviors to use in future anger situations. 4. Patients will learn that anger itself is normal and cannot be eliminated, and that healthier reactions can assist with resolving conflict rather than worsening situations.  Summary of Patient Progress:  Did not attend  Therapeutic Modalities:   Cognitive Behavioral Therapy  Racer Quam D. Mykal Batiz LCSW   

## 2018-07-26 NOTE — [facility] Group Notes (Signed)
[facility] Group Notes:  (Nursing/MHT/Case Management/Adjunct)  Date:  07/26/2018  Time:  2:32 PM  Type of Therapy:  Psychoeducational Skills  Participation Level:  Did Not Attend  Participation Quality:  Did not attend  Affect:  Did not attend  Cognitive:  Did not attend  Insight:  None  Engagement in Group:  Did not attend  Modes of Intervention:  Did not attend  Summary of Progress/Problems: Pt did not attend Psychoeducational group with topic healthy support systems. Wilbur Labuda Shanta 07/26/2018, 2:32 PM 

## 2018-07-26 NOTE — Progress Notes (Signed)
Adult Psychoeducational Group Note  Date:  07/26/2018 Time:  3:19 AM  Group Topic/Focus:  Wrap-Up Group:   The focus of this group is to help patients review their daily goal of treatment and discuss progress on daily workbooks.  Participation Level:  Active  Participation Quality:  Appropriate  Affect:  Appropriate  Cognitive:  Appropriate  Insight: Appropriate  Engagement in Group:  Engaged  Modes of Intervention:  Discussion  Additional Comments:  Pt stated she had an up and down day.  Pt rated the day at a 2/10.   Lavanna Rog 07/26/2018, 3:19 AM

## 2018-07-26 NOTE — Progress Notes (Signed)
[facility]  07/26/2018 3:14 PM Veronica Waters  MRN:  [id number] Subjective: Patient is seen and examined.  Patient is a 27 year old female with a past psychiatric history significant for borderline personality disorder, posttraumatic stress disorder as well as bipolar disorder.  She is seen in follow-up.  She is acting more oppositional today.  She has not gotten out of bed.  She did not let them draw her blood for her blood sugar this morning.  She is just acting out.  She stated she is frustrated over the fact that she has not been discharged.  She states she still not sleeping well, and has racing thoughts at night.  She is unable to recall whether or not any medication help with that in the past.  We discussed the possibility of switching medicines as well as increasing the lithium.  She has not been attending groups, and is trying to give the nurses a hard time as well. Principal Problem: <principal problem not specified> Diagnosis:   Patient Active Problem List   Diagnosis Date Noted  . Bipolar affective disorder (HCC) [F31.9] 07/17/2018  . Overdose [T50.901A] 07/14/2018  . Suicide attempt (HCC) [T14.91XA]   . Endotracheally intubated [Z97.8]   . Posttraumatic stress disorder [F43.10]   . Borderline personality disorder (HCC) [F60.3]   . Diabetes mellitus type 2 in obese (HCC) [E11.69, E66.9]   . Bipolar 1 disorder, mixed, severe (HCC) [F31.63] 06/28/2018  . Tachycardia [R00.0] 01/18/2018  . Severe recurrent major depression without psychotic features (HCC) [F33.2] 01/05/2018  . MDD (major depressive disorder), recurrent severe, without psychosis (HCC) [F33.2] 09/01/2017   Total Time spent with patient: 15 minutes  Past Psychiatric History: See admission H&P  Past Medical History:  Past Medical History:  Diagnosis Date  . Asthma   . Hypertension   . Major depressive disorder    History reviewed. No pertinent surgical history. Family History:  Family History  Family  history unknown: Yes   Family Psychiatric  History: See admission H&P Social History:  Social History   Substance and Sexual Activity  Alcohol Use No     Social History   Substance and Sexual Activity  Drug Use No    Social History   Socioeconomic History  . Marital status: Divorced    Spouse name: Not on file  . Number of children: Not on file  . Years of education: Not on file  . Highest education level: Not on file  Occupational History  . Not on file  Social Needs  . Financial resource strain: Not on file  . Food insecurity:    Worry: Not on file    Inability: Not on file  . Transportation needs:    Medical: Not on file    Non-medical: Not on file  Tobacco Use  . Smoking status: Never Smoker  . Smokeless tobacco: Never Used  Substance and Sexual Activity  . Alcohol use: No  . Drug use: No  . Sexual activity: Not Currently    Birth control/protection: Other-see comments    Comment: Rod in left arm for birth control  Lifestyle  . Physical activity:    Days per week: Not on file    Minutes per session: Not on file  . Stress: Not on file  Relationships  . Social connections:    Talks on phone: Not on file    Gets together: Not on file    Attends religious service: Not on file    Active member of club  or organization: Not on file    Attends meetings of clubs or organizations: Not on file    Relationship status: Not on file  Other Topics Concern  . Not on file  Social History Narrative  . Not on file   Additional Social History:                         Sleep: Poor  Appetite:  Fair  Current Medications: Current Facility-Administered Medications  Medication Dose Route Frequency Provider Last Rate Last Dose  . acetaminophen (TYLENOL) tablet 650 mg  650 mg Oral Q6H PRN Nira Conn A, NP   650 mg at 07/23/18 2120  . folic acid (FOLVITE) tablet 1 mg  1 mg Oral Daily Rankin, Shuvon B, NP   1 mg at 07/25/18 0829  . hydrOXYzine (ATARAX/VISTARIL)  tablet 25 mg  25 mg Oral Q6H PRN Cobos, Rockey Situ, MD   25 mg at 07/25/18 1805  . levothyroxine (SYNTHROID, LEVOTHROID) tablet 50 mcg  50 mcg Oral QAC breakfast Rankin, Shuvon B, NP   50 mcg at 07/25/18 0513  . lisinopril (PRINIVIL,ZESTRIL) tablet 10 mg  10 mg Oral Daily Nira Conn A, NP   10 mg at 07/25/18 0828  . lithium carbonate capsule 600 mg  600 mg Oral QHS Antonieta Pert, MD      . LORazepam (ATIVAN) tablet 1 mg  1 mg Oral Q8H PRN Cobos, Rockey Situ, MD   1 mg at 07/25/18 2117  . metFORMIN (GLUCOPHAGE) tablet 500 mg  500 mg Oral Q breakfast Truman Hayward, FNP   500 mg at 07/25/18 1610  . ondansetron (ZOFRAN-ODT) disintegrating tablet 4 mg  4 mg Oral Q8H PRN Cobos, Rockey Situ, MD   4 mg at 07/23/18 1150  . propranolol (INDERAL) tablet 20 mg  20 mg Oral BID Nira Conn A, NP   20 mg at 07/25/18 1707  . QUEtiapine (SEROQUEL) tablet 400 mg  400 mg Oral QHS Antonieta Pert, MD   400 mg at 07/25/18 2117  . thiamine (VITAMIN B-1) tablet 100 mg  100 mg Oral Daily Rankin, Shuvon B, NP   100 mg at 07/25/18 0829  . vortioxetine HBr (TRINTELLIX) tablet 5 mg  5 mg Oral Daily Antonieta Pert, MD        Lab Results:  Results for orders placed or performed during the hospital encounter of 07/17/18 (from the past 48 hour(s))  Glucose, capillary     Status: None   Collection Time: 07/24/18  8:34 PM  Result Value Ref Range   Glucose-Capillary 93 70 - 99 mg/dL  Glucose, capillary     Status: Abnormal   Collection Time: 07/25/18  5:19 AM  Result Value Ref Range   Glucose-Capillary 127 (H) 70 - 99 mg/dL  Glucose, capillary     Status: Abnormal   Collection Time: 07/25/18 12:06 PM  Result Value Ref Range   Glucose-Capillary 104 (H) 70 - 99 mg/dL   Comment 1 Notify RN   Glucose, capillary     Status: None   Collection Time: 07/25/18  5:04 PM  Result Value Ref Range   Glucose-Capillary 98 70 - 99 mg/dL   Comment 1 Notify RN   Glucose, capillary     Status: Abnormal   Collection Time:  07/25/18  8:53 PM  Result Value Ref Range   Glucose-Capillary 100 (H) 70 - 99 mg/dL  Glucose, capillary     Status: Abnormal  Collection Time: 07/26/18  6:24 AM  Result Value Ref Range   Glucose-Capillary 104 (H) 70 - 99 mg/dL    Blood Alcohol level:  Lab Results  Component Value Date   ETH 40 (H) 07/14/2018   ETH <10 01/18/2018    Metabolic Disorder Labs: Lab Results  Component Value Date   HGBA1C 5.4 06/30/2018   MPG 108.28 06/30/2018   MPG 108.28 01/06/2018   Lab Results  Component Value Date   PROLACTIN 23.5 (H) 06/30/2018   Lab Results  Component Value Date   CHOL 230 (H) 06/30/2018   TRIG 366 (H) 06/30/2018   HDL 33 (L) 06/30/2018   CHOLHDL 7.0 06/30/2018   VLDL 73 (H) 06/30/2018   LDLCALC 124 (H) 06/30/2018   LDLCALC 158 (H) 01/06/2018    Physical Findings: AIMS: Facial and Oral Movements Muscles of Facial Expression: None, normal Lips and Perioral Area: None, normal Jaw: None, normal Tongue: None, normal,Extremity Movements Upper (arms, wrists, hands, fingers): None, normal Lower (legs, knees, ankles, toes): None, normal, Trunk Movements Neck, shoulders, hips: None, normal, Overall Severity Severity of abnormal movements (highest score from questions above): None, normal Incapacitation due to abnormal movements: None, normal Patient's awareness of abnormal movements (rate only patient's report): No Awareness, Dental Status Current problems with teeth and/or dentures?: No Does patient usually wear dentures?: No  CIWA:    COWS:     Musculoskeletal: Strength & Muscle Tone: within normal limits Gait & Station: normal Patient leans: N/A  Psychiatric Specialty Exam: Physical Exam  Constitutional: She is oriented to person, place, and time. She appears well-developed and well-nourished.  HENT:  Head: Normocephalic and atraumatic.  Respiratory: Effort normal.  Neurological: She is alert and oriented to person, place, and time.    ROS  Blood  pressure (!) 113/91, pulse 84, temperature 97.6 F (36.4 C), temperature source Oral, resp. rate 16, height 5\' 7"  (1.702 m), weight (!) 139.7 kg (308 lb).Body mass index is 48.24 kg/m.  General Appearance: Disheveled  Eye Contact:  Fair  Speech:  Normal Rate  Volume:  Normal  Mood:  Anxious, Dysphoric and Irritable  Affect:  Congruent  Thought Process:  Coherent  Orientation:  Full (Time, Place, and Person)  Thought Content:  Logical  Suicidal Thoughts:  Yes.  without intent/plan  Homicidal Thoughts:  No  Memory:  Immediate;   Fair Recent;   Fair Remote;   Fair  Judgement:  Impaired  Insight:  Lacking  Psychomotor Activity:  Normal  Concentration:  Concentration: Fair and Attention Span: Fair  Recall:  [employer] of Knowledge:  Fair  Language:  Fair  Akathisia:  Negative  Handed:  Right  AIMS (if indicated):     Assets:  Desire for Improvement Housing Resilience  ADL's:  Intact  Cognition:  WNL  Sleep:  Number of Hours: 3.25     Treatment Plan Summary: Daily contact with patient to assess and evaluate symptoms and progress in treatment, Medication management and Plan : Patient is seen and examined.  Patient is a 27 year old female with the above-stated past psychiatric history is seen in follow-up.  She continues to have complaints of racing thoughts, depressed mood, but wanting to go home.  We discussed options and medications today.  I am going to increase her lithium to 600 mg p.o. nightly.  I am also going to add Trintellix 5 mg p.o. daily.  We will monitor for any exacerbation manic symptoms.  We will order a lithium level for 07/28/2018 as  well as thyroid and chemistries.  Hopefully she will begin to turn around.  She has refused Accu-Cheks, but her blood sugars been stable throughout the course the hospitalization.  We will stop that.  Antonieta Pert, MD 07/26/2018, 3:14 PM

## 2018-07-27 MED ORDER — BUPROPION HCL ER (XL) 150 MG PO TB24
150.0000 mg | ORAL_TABLET | Freq: Every day | ORAL | Status: DC
  Administered 2018-07-27 – 2018-07-28 (×2): 150 mg via ORAL
  Filled 2018-07-27 (×3): qty 1
  Filled 2018-07-27: qty 7

## 2018-07-27 MED ORDER — BUPROPION HCL ER (XL) 150 MG PO TB24: 150.0000 mg | ORAL_TABLET | Freq: Every day | ORAL | 0 refills | Status: DC

## 2018-07-27 MED ORDER — HYDROXYZINE HCL 25 MG PO TABS: 25.0000 mg | ORAL_TABLET | Freq: Four times a day (QID) | ORAL | 0 refills | Status: DC | PRN

## 2018-07-27 MED ORDER — LITHIUM CARBONATE 600 MG PO CAPS: 600.0000 mg | ORAL_CAPSULE | Freq: Every day | ORAL | 0 refills | Status: DC

## 2018-07-27 MED ORDER — QUETIAPINE FUMARATE 400 MG PO TABS: 400.0000 mg | ORAL_TABLET | Freq: Every day | ORAL | 0 refills | Status: DC
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
## 2018-07-27 NOTE — Progress Notes (Signed)
[facility] MD Progress Note  07/27/2018 9:56 AM Veronica Waters  MRN:  [id number] Subjective: Patient is seen and examined.  Patient is a 27 year old female with a past psychiatric history significant for borderline personality disorder, PTSD and bipolar disorder.  She is seen in follow-up.  After yesterday's meeting she really had a turnaround.  She was out in the hallway, was in the day room, and was smiling and cooperative.  This morning that is continued.  She is a little tired, but is getting out of bed.  We discussed the Trintellix.  She stated she had gotten that previously at [facility], and had a bad reaction to it.  That was stopped.  We discussed other medications for depression, and somehow or another she stated that she had not been on Wellbutrin before.  We discussed that.  She denied any history of seizure activity.  She admitted that she was still depressed, but was able to see the light.  She stated that if she had been let out earlier this week she probably would have just overdosed again.  Her insight has improved significantly over the last 24 hours.  She has a lithium level, TSH and metabolic panel for the a.m. Principal Problem: <principal problem not specified> Diagnosis:   Patient Active Problem List   Diagnosis Date Noted  . Bipolar affective disorder (HCC) [F31.9] 07/17/2018  . Overdose [T50.901A] 07/14/2018  . Suicide attempt (HCC) [T14.91XA]   . Endotracheally intubated [Z97.8]   . Posttraumatic stress disorder [F43.10]   . Borderline personality disorder (HCC) [F60.3]   . Diabetes mellitus type 2 in obese (HCC) [E11.69, E66.9]   . Bipolar 1 disorder, mixed, severe (HCC) [F31.63] 06/28/2018  . Tachycardia [R00.0] 01/18/2018  . Severe recurrent major depression without psychotic features (HCC) [F33.2] 01/05/2018  . MDD (major depressive disorder), recurrent severe, without psychosis (HCC) [F33.2] 09/01/2017   Total Time spent with patient: 20 minutes  Past Psychiatric History: See  admission H&P  Past Medical History:  Past Medical History:  Diagnosis Date  . Asthma   . Hypertension   . Major depressive disorder    History reviewed. No pertinent surgical history. Family History:  Family History  Family history unknown: Yes   Family Psychiatric  History: See admission H&P Social History:  Social History   Substance and Sexual Activity  Alcohol Use No     Social History   Substance and Sexual Activity  Drug Use No    Social History   Socioeconomic History  . Marital status: Divorced    Spouse name: Not on file  . Number of children: Not on file  . Years of education: Not on file  . Highest education level: Not on file  Occupational History  . Not on file  Social Needs  . Financial resource strain: Not on file  . Food insecurity:    Worry: Not on file    Inability: Not on file  . Transportation needs:    Medical: Not on file    Non-medical: Not on file  Tobacco Use  . Smoking status: Never Smoker  . Smokeless tobacco: Never Used  Substance and Sexual Activity  . Alcohol use: No  . Drug use: No  . Sexual activity: Not Currently    Birth control/protection: Other-see comments    Comment: Rod in left arm for birth control  Lifestyle  . Physical activity:    Days per week: Not on file    Minutes per session: Not on file  .  Stress: Not on file  Relationships  . Social connections:    Talks on phone: Not on file    Gets together: Not on file    Attends religious service: Not on file    Active member of club or organization: Not on file    Attends meetings of clubs or organizations: Not on file    Relationship status: Not on file  Other Topics Concern  . Not on file  Social History Narrative  . Not on file   Additional Social History:                         Sleep: Fair  Appetite:  Good  Current Medications: Current Facility-Administered Medications  Medication Dose Route Frequency Provider Last Rate Last Dose  .  acetaminophen (TYLENOL) tablet 650 mg  650 mg Oral Q6H PRN Nira Conn A, NP   650 mg at 07/26/18 2128  . buPROPion (WELLBUTRIN XL) 24 hr tablet 150 mg  150 mg Oral Daily Antonieta Pert, MD      . folic acid (FOLVITE) tablet 1 mg  1 mg Oral Daily Rankin, Shuvon B, NP   1 mg at 07/27/18 0834  . hydrOXYzine (ATARAX/VISTARIL) tablet 25 mg  25 mg Oral Q6H PRN Cobos, Rockey Situ, MD   25 mg at 07/26/18 1641  . levothyroxine (SYNTHROID, LEVOTHROID) tablet 50 mcg  50 mcg Oral QAC breakfast Rankin, Shuvon B, NP   50 mcg at 07/27/18 0617  . lisinopril (PRINIVIL,ZESTRIL) tablet 10 mg  10 mg Oral Daily Nira Conn A, NP   10 mg at 07/27/18 0834  . lithium carbonate capsule 600 mg  600 mg Oral QHS Antonieta Pert, MD   600 mg at 07/26/18 2113  . LORazepam (ATIVAN) tablet 1 mg  1 mg Oral Q8H PRN Cobos, Rockey Situ, MD   1 mg at 07/27/18 0837  . metFORMIN (GLUCOPHAGE) tablet 500 mg  500 mg Oral Q breakfast Truman Hayward, FNP   500 mg at 07/27/18 0834  . ondansetron (ZOFRAN-ODT) disintegrating tablet 4 mg  4 mg Oral Q8H PRN Cobos, Rockey Situ, MD   4 mg at 07/23/18 1150  . propranolol (INDERAL) tablet 20 mg  20 mg Oral BID Nira Conn A, NP   20 mg at 07/27/18 1610  . QUEtiapine (SEROQUEL) tablet 400 mg  400 mg Oral QHS Antonieta Pert, MD   400 mg at 07/26/18 2113  . thiamine (VITAMIN B-1) tablet 100 mg  100 mg Oral Daily Rankin, Shuvon B, NP   100 mg at 07/27/18 9604    Lab Results:  Results for orders placed or performed during the hospital encounter of 07/17/18 (from the past 48 hour(s))  Glucose, capillary     Status: Abnormal   Collection Time: 07/25/18 12:06 PM  Result Value Ref Range   Glucose-Capillary 104 (H) 70 - 99 mg/dL   Comment 1 Notify RN   Glucose, capillary     Status: None   Collection Time: 07/25/18  5:04 PM  Result Value Ref Range   Glucose-Capillary 98 70 - 99 mg/dL   Comment 1 Notify RN   Glucose, capillary     Status: Abnormal   Collection Time: 07/25/18  8:53 PM   Result Value Ref Range   Glucose-Capillary 100 (H) 70 - 99 mg/dL  Glucose, capillary     Status: Abnormal   Collection Time: 07/26/18  6:24 AM  Result Value Ref Range  Glucose-Capillary 104 (H) 70 - 99 mg/dL    Blood Alcohol level:  Lab Results  Component Value Date   ETH 40 (H) 07/14/2018   ETH <10 01/18/2018    Metabolic Disorder Labs: Lab Results  Component Value Date   HGBA1C 5.4 06/30/2018   MPG 108.28 06/30/2018   MPG 108.28 01/06/2018   Lab Results  Component Value Date   PROLACTIN 23.5 (H) 06/30/2018   Lab Results  Component Value Date   CHOL 230 (H) 06/30/2018   TRIG 366 (H) 06/30/2018   HDL 33 (L) 06/30/2018   CHOLHDL 7.0 06/30/2018   VLDL 73 (H) 06/30/2018   LDLCALC 124 (H) 06/30/2018   LDLCALC 158 (H) 01/06/2018    Physical Findings: AIMS: Facial and Oral Movements Muscles of Facial Expression: None, normal Lips and Perioral Area: None, normal Jaw: None, normal Tongue: None, normal,Extremity Movements Upper (arms, wrists, hands, fingers): None, normal Lower (legs, knees, ankles, toes): None, normal, Trunk Movements Neck, shoulders, hips: None, normal, Overall Severity Severity of abnormal movements (highest score from questions above): None, normal Incapacitation due to abnormal movements: None, normal Patient's awareness of abnormal movements (rate only patient's report): No Awareness, Dental Status Current problems with teeth and/or dentures?: No Does patient usually wear dentures?: No  CIWA:    COWS:     Musculoskeletal: Strength & Muscle Tone: within normal limits Gait & Station: normal Patient leans: N/A  Psychiatric Specialty Exam: Physical Exam  Constitutional: She is oriented to person, place, and time. She appears well-developed and well-nourished.  HENT:  Head: Normocephalic and atraumatic.  Respiratory: Effort normal.  Neurological: She is alert and oriented to person, place, and time.    ROS  Blood pressure (!) 133/103,  pulse (!) 101, temperature 97.8 F (36.6 C), temperature source Oral, resp. rate 16, height 5\' 7"  (1.702 m), weight (!) 139.7 kg (308 lb), SpO2 100 %.Body mass index is 48.24 kg/m.  General Appearance: Disheveled  Eye Contact:  Fair  Speech:  Normal Rate  Volume:  Normal  Mood:  Anxious and Depressed  Affect:  Congruent  Thought Process:  Coherent  Orientation:  Full (Time, Place, and Person)  Thought Content:  Logical  Suicidal Thoughts:  Yes.  without intent/plan  Homicidal Thoughts:  No  Memory:  Immediate;   Fair Recent;   Fair Remote;   Fair  Judgement:  Intact  Insight:  Fair  Psychomotor Activity:  Normal  Concentration:  Concentration: Fair and Attention Span: Fair  Recall:  [employer]Fair  Fund of Knowledge:  Fair  Language:  Fair  Akathisia:  Negative  Handed:  Right  AIMS (if indicated):     Assets:  Desire for Improvement Housing Resilience Social Support  ADL's:  Intact  Cognition:  WNL  Sleep:  Number of Hours: 6.5     Treatment Plan Summary: Daily contact with patient to assess and evaluate symptoms and progress in treatment, Medication management and Plan : Patient is seen and examined.  Patient is a 27 year old female with the above-stated past psychiatric history seen in follow-up.  She really has had a significant turnaround in the last 24 hours.  She is gotten out of bed, been more interactive, more appropriate.  She did not take the Trintellix because she had previously had a bad reaction to that.  That is been canceled.  Review of her medications found out that she is never been treated with Wellbutrin before.  We will start Wellbutrin XL 150 mg p.o. daily.  The patient  denied any previous history of seizures.  No other changes in her medications today.  Hopefully she will continue to improve.  She has a lithium level, TSH and a metabolic panel pending for tomorrow morning.  Antonieta Pert, MD 07/27/2018, 9:56 AM

## 2018-07-27 NOTE — Progress Notes (Signed)
Patient ID: Veronica Waters, female   DOB: [birth year], 27 y.o.   MRN: [id number]   D: Pt has been flat and depressed today on the unit. Pt did attend groups and did engage in any treatment. Pt took all medication as prescribed by the doctor except Trintellex, Dr. Mallie Darting made aware. Dr. Mallie Darting met with patient, changes noted to patients medication regimen. Pt reported that her depression was a 10, her hopelessness was a 8, and her anxiety was a 9. Pt reported being positive SI, but could contract for safety. Pt reported being negative HI, no AH/VH noted. Pt reported that her goal for today was to go to groups. A: 15 min checks continued for patient safety. R: Pt safety maintained.

## 2018-07-27 NOTE — [facility] Group Notes (Signed)
[facility] Group Notes:  (Nursing/MHT/Case Management/Adjunct)  Date:  07/27/2018  Time:  1:47 PM  Type of Therapy:  Psychoeducational Skills  Participation Level:  Did Not Attend  Participation Quality:  Did not attend  Affect:  Did not attend  Cognitive:  Did not attend  Insight:  None  Engagement in Group:  Did not attend  Modes of Intervention:  Did not attend  Summary of Progress/Problems: Pt did not attend Psychoeducational group with topic healthy support systems.    Nakeisha Greenhouse Shanta 07/27/2018, 1:47 PM 

## 2018-07-27 NOTE — [facility] Group Notes (Signed)
[facility]  Date/Time:  07/27/2018  10:00-11:00AM  Type of Therapy and Topic:  Group Therapy:  Healthy and Unhealthy Supports  Participation Level:  Active   Description of Group:  Patients in this group were introduced to the idea of adding a variety of healthy supports to address the various needs in their lives.Patients discussed what additional healthy supports could be helpful in their recovery and wellness after discharge in order to prevent future hospitalizations.   An emphasis was placed on using counselor, doctor, therapy groups, 12-step groups, and problem-specific support groups to expand supports.  They also worked as a group on developing a specific plan for several patients to deal with unhealthy supports through boundary-setting, psychoeducation with loved ones, and even termination of relationships.   Therapeutic Goals:   1)  discuss importance of adding supports to stay well once out of the hospital  2)  compare healthy versus unhealthy supports and identify some examples of each  3)  generate ideas and descriptions of healthy supports that can be added  4)  offer mutual support about how to address unhealthy supports  5)  encourage active participation in and adherence to discharge plan    Summary of Patient Progress:  The patient stated that current healthy supports in her life are best friend, therapist. She recognizes that to receive benefit she most used them and allow them to help her. The patient also stated that she has a large church family who are available to provide her with support as well as she embarks on her recovery jpurney.  Therapeutic Modalities:   Motivational Interviewing Brief Solution-Focused Therapy  Evorn Gongonnie D Kimyatta Lecy

## 2018-07-27 NOTE — Plan of Care (Signed)
D: Pt  passive SI- contract for safety .denies HI/AVH. Pt is pleasant and cooperative. Pt presents histrionic, pt stated I feel " a little better, I'm ready to go". Pt visible on the unit making herself seen and heard by anyone that will look or listen.  A: Pt was offered support and encouragement. Pt was given scheduled medications. Pt was encourage to attend groups. Q 15 minute checks were done for safety.   R:Pt attends groups and interacts well with peers and staff. Pt is taking medication. Pt has no complaints.Pt receptive to treatment and safety maintained on unit.   Problem: Education: Goal: Utilization of techniques to improve thought processes will improve Outcome: Adequate for Discharge   Problem: Education: Goal: Knowledge of the prescribed therapeutic regimen will improve Outcome: Adequate for Discharge   Problem: Activity: Goal: Interest or engagement in leisure activities will improve Outcome: Adequate for Discharge   Problem: Activity: Goal: Imbalance in normal sleep/wake cycle will improve Outcome: Adequate for Discharge   Problem: Coping: Goal: Coping ability will improve Outcome: Adequate for Discharge   Problem: Coping: Goal: Will verbalize feelings Outcome: Adequate for Discharge   Problem: Health Behavior/Discharge Planning: Goal: Ability to make decisions will improve Outcome: Adequate for Discharge   Problem: Health Behavior/Discharge Planning: Goal: Compliance with therapeutic regimen will improve Outcome: Adequate for Discharge   Problem: Role Relationship: Goal: Will demonstrate positive changes in social behaviors and relationships Outcome: Adequate for Discharge   Problem: Safety: Goal: Ability to disclose and discuss suicidal ideas will improve Outcome: Adequate for Discharge   Problem: Safety: Goal: Ability to identify and utilize support systems that promote safety will improve Outcome: Adequate for Discharge   Problem:  Self-Concept: Goal: Will verbalize positive feelings about self Outcome: Adequate for Discharge   Problem: Self-Concept: Goal: Level of anxiety will decrease Outcome: Adequate for Discharge   Problem: Education: Goal: Knowledge of [place] General Education information/materials will improve Outcome: Adequate for Discharge   Problem: Education: Goal: Emotional status will improve Outcome: Adequate for Discharge   Problem: Education: Goal: Mental status will improve Outcome: Adequate for Discharge   Problem: Education: Goal: Verbalization of understanding the information provided will improve Outcome: Adequate for Discharge   Problem: Activity: Goal: Interest or engagement in activities will improve Outcome: Adequate for Discharge   Problem: Activity: Goal: Sleeping patterns will improve Outcome: Adequate for Discharge   Problem: Coping: Goal: Ability to verbalize frustrations and anger appropriately will improve Outcome: Adequate for Discharge   Problem: Coping: Goal: Ability to demonstrate self-control will improve Outcome: Adequate for Discharge   Problem: Health Behavior/Discharge Planning: Goal: Identification of resources available to assist in meeting health care needs will improve Outcome: Adequate for Discharge   Problem: Health Behavior/Discharge Planning: Goal: Compliance with treatment plan for underlying cause of condition will improve Outcome: Adequate for Discharge   Problem: Physical Regulation: Goal: Ability to maintain clinical measurements within normal limits will improve Outcome: Adequate for Discharge   Problem: Safety: Goal: Periods of time without injury will increase Outcome: Adequate for Discharge   Problem: Education: Goal: Ability to make informed decisions regarding treatment will improve Outcome: Adequate for Discharge   Problem: Coping: Goal: Coping ability will improve Outcome: Adequate for Discharge   Problem: Health  Behavior/Discharge Planning: Goal: Identification of resources available to assist in meeting health care needs will improve Outcome: Adequate for Discharge   Problem: Medication: Goal: Compliance with prescribed medication regimen will improve Outcome: Adequate for Discharge   Problem: Self-Concept: Goal: Ability  to disclose and discuss suicidal ideas will improve Outcome: Adequate for Discharge   Problem: Self-Concept: Goal: Will verbalize positive feelings about self Outcome: Adequate for Discharge   Problem: Education: Goal: Knowledge of General Education information will improve Description Including pain rating scale, medication(s)/side effects and non-pharmacologic comfort measures Outcome: Adequate for Discharge   Problem: Health Behavior/Discharge Planning: Goal: Ability to manage health-related needs will improve Outcome: Adequate for Discharge   Problem: Coping: Goal: Level of anxiety will decrease Outcome: Adequate for Discharge   Problem: Safety: Goal: Ability to remain free from injury will improve Outcome: Adequate for Discharge

## 2018-07-28 NOTE — Progress Notes (Signed)
D:  Patient's self inventory sheet, patient sleeps good, sleep medication helpful.  Fair appetite, normal energy level, good concentration.  Rated depression 7, hopeless and anxiety 6.  Denied withdrawals.  Denied SI.  Denied physical problems.  Denied physical pain.  Goal is discharge, spend time with family.  "Thanks for all yall's help through the last couple of weeks."  Does have discharge plans. A:  Medications administered per MD orders  Emotional support and encouragement given patient. R:  Denied SI and HI, contracts for safety.  Denied A/V hallucinations.  Safety maintained with 15 minute checks.

## 2018-07-28 NOTE — Progress Notes (Signed)
  [facility] :  Will you be returning to the same living situation after discharge:  Yes,  patient reports she is returning to her home at discharge At discharge, do you have transportation home?: Yes,  patient reports her mother will pick her up at discharge Do you have the ability to pay for your medications: No.  Release of information consent forms completed and in the chart;  Patient's signature needed at discharge.  Patient to Follow up at: Follow-up Information    Services, [facility]. Go on 07/31/2018.   Why:  Appointment for medication management and therapy services is Thursday,  07/31/18 at 9:00am. Please bring proof of income and any discharge paperwork from this hospitalization. Contact information: [address] [place] [id number] [phone]           Next level of care provider has [facility]:yes  Safety Planning and Suicide Prevention discussed: Yes,  with the patient  Have you used any form of tobacco in the last 30 days? (Cigarettes, Smokeless Tobacco, Cigars, and/or Pipes): No  Has patient been referred to the Quitline?: N/A patient is not a smoker  Patient has been referred for addiction treatment: Pt. refused referral  Veronica Waters, LCSWA 07/28/2018, 9:27 AM

## 2018-07-28 NOTE — [facility] Suicide Risk Assessment (Signed)
[facility]   Principal Problem: Bipolar affective disorder [facility](HCC) Discharge Diagnoses:  Patient Active Problem List   Diagnosis Date Noted  . Bipolar affective disorder (HCC) [F31.9] 07/17/2018  . Overdose [T50.901A] 07/14/2018  . Suicide attempt (HCC) [T14.91XA]   . Endotracheally intubated [Z97.8]   . Posttraumatic stress disorder [F43.10]   . Borderline personality disorder (HCC) [F60.3]   . Diabetes mellitus type 2 in obese (HCC) [E11.69, E66.9]   . Bipolar 1 disorder, mixed, severe (HCC) [F31.63] 06/28/2018  . Tachycardia [R00.0] 01/18/2018  . Severe recurrent major depression without psychotic features (HCC) [F33.2] 01/05/2018  . MDD (major depressive disorder), recurrent severe, without psychosis (HCC) [F33.2] 09/01/2017    Total Time spent with patient: 15 minutes  Musculoskeletal: Strength & Muscle Tone: within normal limits Gait & Station: normal Patient leans: N/A  Psychiatric Specialty Exam: Review of Systems  All other systems reviewed and are negative.   Blood pressure (!) 133/103, pulse (!) 101, temperature 97.8 F (36.6 C), temperature source Oral, resp. rate 16, height 5\' 7"  (1.702 m), weight (!) 139.7 kg (308 lb), SpO2 100 %.Body mass index is 48.24 kg/m.  General Appearance: Casual  Eye Contact::  Good  Speech:  Clear and Coherent409  Volume:  Normal  Mood:  Euthymic  Affect:  Appropriate  Thought Process:  Coherent  Orientation:  Full (Time, Place, and Person)  Thought Content:  Logical  Suicidal Thoughts:  No  Homicidal Thoughts:  No  Memory:  Immediate;   Fair Recent;   Fair Remote;   Fair  Judgement:  Intact  Insight:  Fair  Psychomotor Activity:  Normal  Concentration:  Good  Recall:  Fair  Fund of Knowledge:Good  Language: Good  Akathisia:  Negative  Handed:  Right  AIMS (if indicated):     Assets:  Desire for Improvement Housing Physical Health Resilience Social Support  Sleep:  Number of Hours: 6.75   Cognition: WNL  ADL's:  Intact   Mental Status Per Nursing Assessment::   On Admission:  Suicidal ideation indicated by patient, Self-harm thoughts, Intention to act on suicide plan  Demographic Factors:  Adolescent or young adult, Caucasian, Low socioeconomic status and Unemployed  Loss Factors: NA  Historical Factors: Impulsivity  Risk Reduction Factors:   Living with another person, especially a relative  Continued Clinical Symptoms:  Personality Disorders:   Cluster B  Cognitive Features That Contribute To Risk:  None    Suicide Risk:  Minimal: No identifiable suicidal ideation.  Patients presenting with no risk factors but with morbid ruminations; may be classified as minimal risk based on the severity of the depressive symptoms  Follow-up Information    Services, [facility]. Go on 07/31/2018.   Why:  Appointment for medication management and therapy services is Thursday,  07/31/18 at 9:00am. Please bring proof of income and any discharge paperwork from this hospitalization. Contact information: [address] [place] [id number] [phone]           Plan Of Care/Follow-up recommendations:  Activity:  ad lib  Veronica PertGreg Lawson Veronica Minks, MD 07/28/2018, 10:45 AM

## 2018-07-28 NOTE — Progress Notes (Signed)
Recreation Therapy Notes  Date: 8.5.19 Time: 0930 Location: [address] Dayroom  Group Topic: Stress Management  Goal Area(s) Addresses:  Patient will verbalize importance of using healthy stress management.  Patient will identify positive emotions associated with healthy stress management.   Intervention: Stress Management  Activity : Guided Imagery.  LRT introduced the stress management technique of guided imagery.  LRT read a script to lead patients on a journey through a meadow.  Patients were to follow along as script was read.  Education: Stress Management, Discharge Planning.   Education Outcome: Acknowledges edcuation/In group clarification offered/Needs additional education  Clinical Observations/Feedback: Pt did not attend group.    Yarianna Varble, LRT/CTRS         Cavin Longman A 07/28/2018 11:26 AM 

## 2018-07-28 NOTE — Progress Notes (Signed)
Discharge Note:  Patient discharged home with mother.  Patient denied SI and HI.  Denied A/V hallucinations.  Denied pain.  Suicide prevention information given and discussed with patient who stated she understood and had no questions.  Patient stated she received all her belongings, clothing, toiletries, misc items, prescriptions, medications.  Patient stated she appreciated all assistance [facility].  All required discharge information given to patient at discharge.

## 2018-07-28 NOTE — Discharge Instructions (Signed)
Patient to follow-up outpatient for Lithium level Veronica Waters(Li), TSH and CMP

## 2018-07-28 NOTE — Tx Team (Signed)
Interdisciplinary Treatment and Diagnostic Plan Update  07/28/2018 Time of Session: 1041 Veronica Waters MRN: [id number]  Principal Diagnosis: Bipolar affective disorder [facility](HCC)  Secondary Diagnoses: Principal Problem:   Bipolar affective disorder (HCC)   Current Medications:  Current Facility-Administered Medications  Medication Dose Route Frequency Provider Last Rate Last Dose  . acetaminophen (TYLENOL) tablet 650 mg  650 mg Oral Q6H PRN Nira ConnBerry, Jason A, NP   650 mg at 07/26/18 2128  . buPROPion (WELLBUTRIN XL) 24 hr tablet 150 mg  150 mg Oral Daily Antonieta Pertlary, Greg Lawson, MD   150 mg at 07/28/18 0847  . folic acid (FOLVITE) tablet 1 mg  1 mg Oral Daily Rankin, Shuvon B, NP   1 mg at 07/28/18 0848  . hydrOXYzine (ATARAX/VISTARIL) tablet 25 mg  25 mg Oral Q6H PRN Cobos, Rockey SituFernando A, MD   25 mg at 07/26/18 1641  . levothyroxine (SYNTHROID, LEVOTHROID) tablet 50 mcg  50 mcg Oral QAC breakfast Rankin, Shuvon B, NP   50 mcg at 07/28/18 0848  . lisinopril (PRINIVIL,ZESTRIL) tablet 10 mg  10 mg Oral Daily Nira ConnBerry, Jason A, NP   10 mg at 07/28/18 0848  . lithium carbonate capsule 600 mg  600 mg Oral QHS Antonieta Pertlary, Greg Lawson, MD   600 mg at 07/27/18 2124  . LORazepam (ATIVAN) tablet 1 mg  1 mg Oral Q8H PRN Cobos, Rockey SituFernando A, MD   1 mg at 07/27/18 2124  . metFORMIN (GLUCOPHAGE) tablet 500 mg  500 mg Oral Q breakfast Truman HaywardStarkes, Takia S, FNP   500 mg at 07/28/18 0848  . ondansetron (ZOFRAN-ODT) disintegrating tablet 4 mg  4 mg Oral Q8H PRN Cobos, Rockey SituFernando A, MD   4 mg at 07/23/18 1150  . propranolol (INDERAL) tablet 20 mg  20 mg Oral BID Nira ConnBerry, Jason A, NP   20 mg at 07/28/18 0849  . QUEtiapine (SEROQUEL) tablet 400 mg  400 mg Oral QHS Antonieta Pertlary, Greg Lawson, MD   400 mg at 07/27/18 2124  . thiamine (VITAMIN B-1) tablet 100 mg  100 mg Oral Daily Rankin, Shuvon B, NP   100 mg at 07/28/18 0849   PTA Medications: Medications Prior to Admission  Medication Sig Dispense Refill Last Dose  . albuterol (PROVENTIL HFA;VENTOLIN  HFA) 108 (90 Base) MCG/ACT inhaler Inhale 1-2 puffs into the lungs every 6 (six) hours as needed for wheezing or shortness of breath. 1 Inhaler 0 unknown  . folic acid (FOLVITE) 1 MG tablet Take 1 tablet (1 mg total) by mouth daily.     . furosemide (LASIX) 20 MG tablet Take 1 tablet (20 mg total) by mouth daily for 3 days. 3 tablet    . levothyroxine (SYNTHROID, LEVOTHROID) 50 MCG tablet Take 1 tablet (50 mcg total) by mouth daily before breakfast. 30 tablet 0 unknown  . lisinopril (PRINIVIL,ZESTRIL) 10 MG tablet Take 1 tablet (10 mg total) by mouth daily. 30 tablet 0 unknown  . Melatonin 3 MG TABS Take 3 mg by mouth daily as needed.   unknown  . metFORMIN (GLUCOPHAGE-XR) 500 MG 24 hr tablet Take 500 mg by mouth daily with breakfast.   unknown  . naltrexone (DEPADE) 50 MG tablet Take 25 mg by mouth daily.   unknow  . potassium chloride SA (K-DUR,KLOR-CON) 20 MEQ tablet Take 1 tablet (20 mEq total) by mouth daily for 3 days. 3 tablet 0   . propranolol (INDERAL) 20 MG tablet Take 1 tablet (20 mg total) by mouth 2 (two) times daily. 60 tablet 0 unknown  .  thiamine 100 MG tablet Take 1 tablet (100 mg total) by mouth daily.       Patient Stressors: Marital or family conflict Medication change or noncompliance Occupational concerns  Patient Strengths: Ability for insight Average or above average intelligence Capable of independent living General fund of knowledge  Treatment Modalities: Medication Management, Group therapy, Case management,  1 to 1 session with clinician, Psychoeducation, Recreational therapy.   Physician Treatment Plan for Primary Diagnosis: Bipolar affective disorder (HCC) Long Term Goal(s): Improvement in symptoms so as ready for discharge Improvement in symptoms so as ready for discharge   Short Term Goals: Ability to identify changes in lifestyle to reduce recurrence of condition will improve Ability to maintain clinical measurements within normal limits will  improve Ability to verbalize feelings will improve Ability to disclose and discuss suicidal ideas Ability to demonstrate self-control will improve Ability to identify and develop effective coping behaviors will improve Ability to maintain clinical measurements within normal limits will improve  Medication Management: Evaluate patient's response, side effects, and tolerance of medication regimen.  Therapeutic Interventions: 1 to 1 sessions, Unit Group sessions and Medication administration.  Evaluation of Outcomes: Adequate for Discharge  Physician Treatment Plan for Secondary Diagnosis: Principal Problem:   Bipolar affective disorder (HCC)  Long Term Goal(s): Improvement in symptoms so as ready for discharge Improvement in symptoms so as ready for discharge   Short Term Goals: Ability to identify changes in lifestyle to reduce recurrence of condition will improve Ability to maintain clinical measurements within normal limits will improve Ability to verbalize feelings will improve Ability to disclose and discuss suicidal ideas Ability to demonstrate self-control will improve Ability to identify and develop effective coping behaviors will improve Ability to maintain clinical measurements within normal limits will improve     Medication Management: Evaluate patient's response, side effects, and tolerance of medication regimen.  Therapeutic Interventions: 1 to 1 sessions, Unit Group sessions and Medication administration.  Evaluation of Outcomes: Adequate for Discharge   RN Treatment Plan for Primary Diagnosis: Bipolar affective disorder (HCC) Long Term Goal(s): Knowledge of disease and therapeutic regimen to maintain health will improve  Short Term Goals: Ability to identify and develop effective coping behaviors will improve and Compliance with prescribed medications will improve  Medication Management: RN will administer medications as ordered by provider, will assess and  evaluate patient's response and provide education to patient for prescribed medication. RN will report any adverse and/or side effects to prescribing provider.  Therapeutic Interventions: 1 on 1 counseling sessions, Psychoeducation, Medication administration, Evaluate responses to treatment, Monitor vital signs and CBGs as ordered, Perform/monitor CIWA, COWS, AIMS and Fall Risk screenings as ordered, Perform wound care treatments as ordered.  Evaluation of Outcomes: Progressing   LCSW Treatment Plan for Primary Diagnosis: Bipolar affective disorder (HCC) Long Term Goal(s): Safe transition to appropriate next level of care at discharge, Engage patient in therapeutic group addressing interpersonal concerns.  Short Term Goals: Engage patient in aftercare planning with referrals and resources and Increase social support  Therapeutic Interventions: Assess for all discharge needs, 1 to 1 time with Social worker, Explore available resources and support systems, Assess for adequacy in community support network, Educate family and significant other(s) on suicide prevention, Complete Psychosocial Assessment, Interpersonal group therapy.  Evaluation of Outcomes: Adequate for Discharge   Progress in Treatment: Attending groups: No. Participating in groups: No. Taking medication as prescribed: Yes. Toleration medication: Yes. Family/Significant other contact made: No, will contact:  patient refused consent Patient understands diagnosis: Yes.  Discussing patient identified problems/goals with staff: Yes. Medical problems stabilized or resolved: Yes. Denies suicidal/homicidal ideation: Yes. Issues/concerns per patient self-inventory: No. Other: none  New problem(s) identified: No, Describe:  none  New Short Term/Long Term Goal(s): medication stabilization, elimination of SI thoughts, development of comprehensive mental wellness plan.    Patient Goals:  "To be back on my meds the right  way."  Discharge Plan or Barriers: Patient is discharging home and will follow up with Arna Medici for medication management and therapy services.   Reason for Continuation of Hospitalization: None  Estimated Length of Stay: Discharging, Monday 07/28/18  Attendees: Patient: Veronica Waters 07/28/2018   Physician: Dr. Jama Flavors, MD; Dr. Landry Mellow, MD 07/28/2018   Nursing: Roddie Mc, RN; Arlyss Repress, RN; Quintella Reichert, RN 07/28/2018   RN Care Manager: 07/28/2018   Social Worker: Daleen Squibb, LCSW; Baldo Daub, LCSWA 07/28/2018   Recreational Therapist: X 07/28/2018   Other: X 07/28/2018   Other: X 07/28/2018   Other:X 07/28/2018     Scribe for Treatment Team: [name], LCSWA 07/28/2018 11:16 AM

## 2018-07-28 NOTE — [facility] Suicide Risk Assessment (Signed)
[facility] INPATIENT:  Family/Significant Other Suicide Prevention Education  Suicide Prevention Education:  Patient Refusal for Family/Significant Other Suicide Prevention Education: The patient Veronica Waters has refused to provide written consent for family/significant other to be provided Family/Significant Other Suicide Prevention Education during admission and/or prior to discharge.  Physician notified.  SPE completed with patient, as patient refused to consent to family contact. SPI pamphlet provided to pt and pt was encouraged to share information with support network, ask questions, and talk about any concerns relating to SPE. Patient denies access to guns/firearms and verbalized understanding of information provided. Mobile Crisis information also provided to patient.    Veronica Waters 07/28/2018, 9:32 AM

## 2018-07-29 IMAGING — DX DG CHEST 2V
2 series · 2 of 2 positions shown · non-contrast
Comparison: 07/15/2018

CLINICAL DATA: Acute respiratory failure, shortness of breath

EXAM:
CHEST - 2 VIEW

[w chest pa]
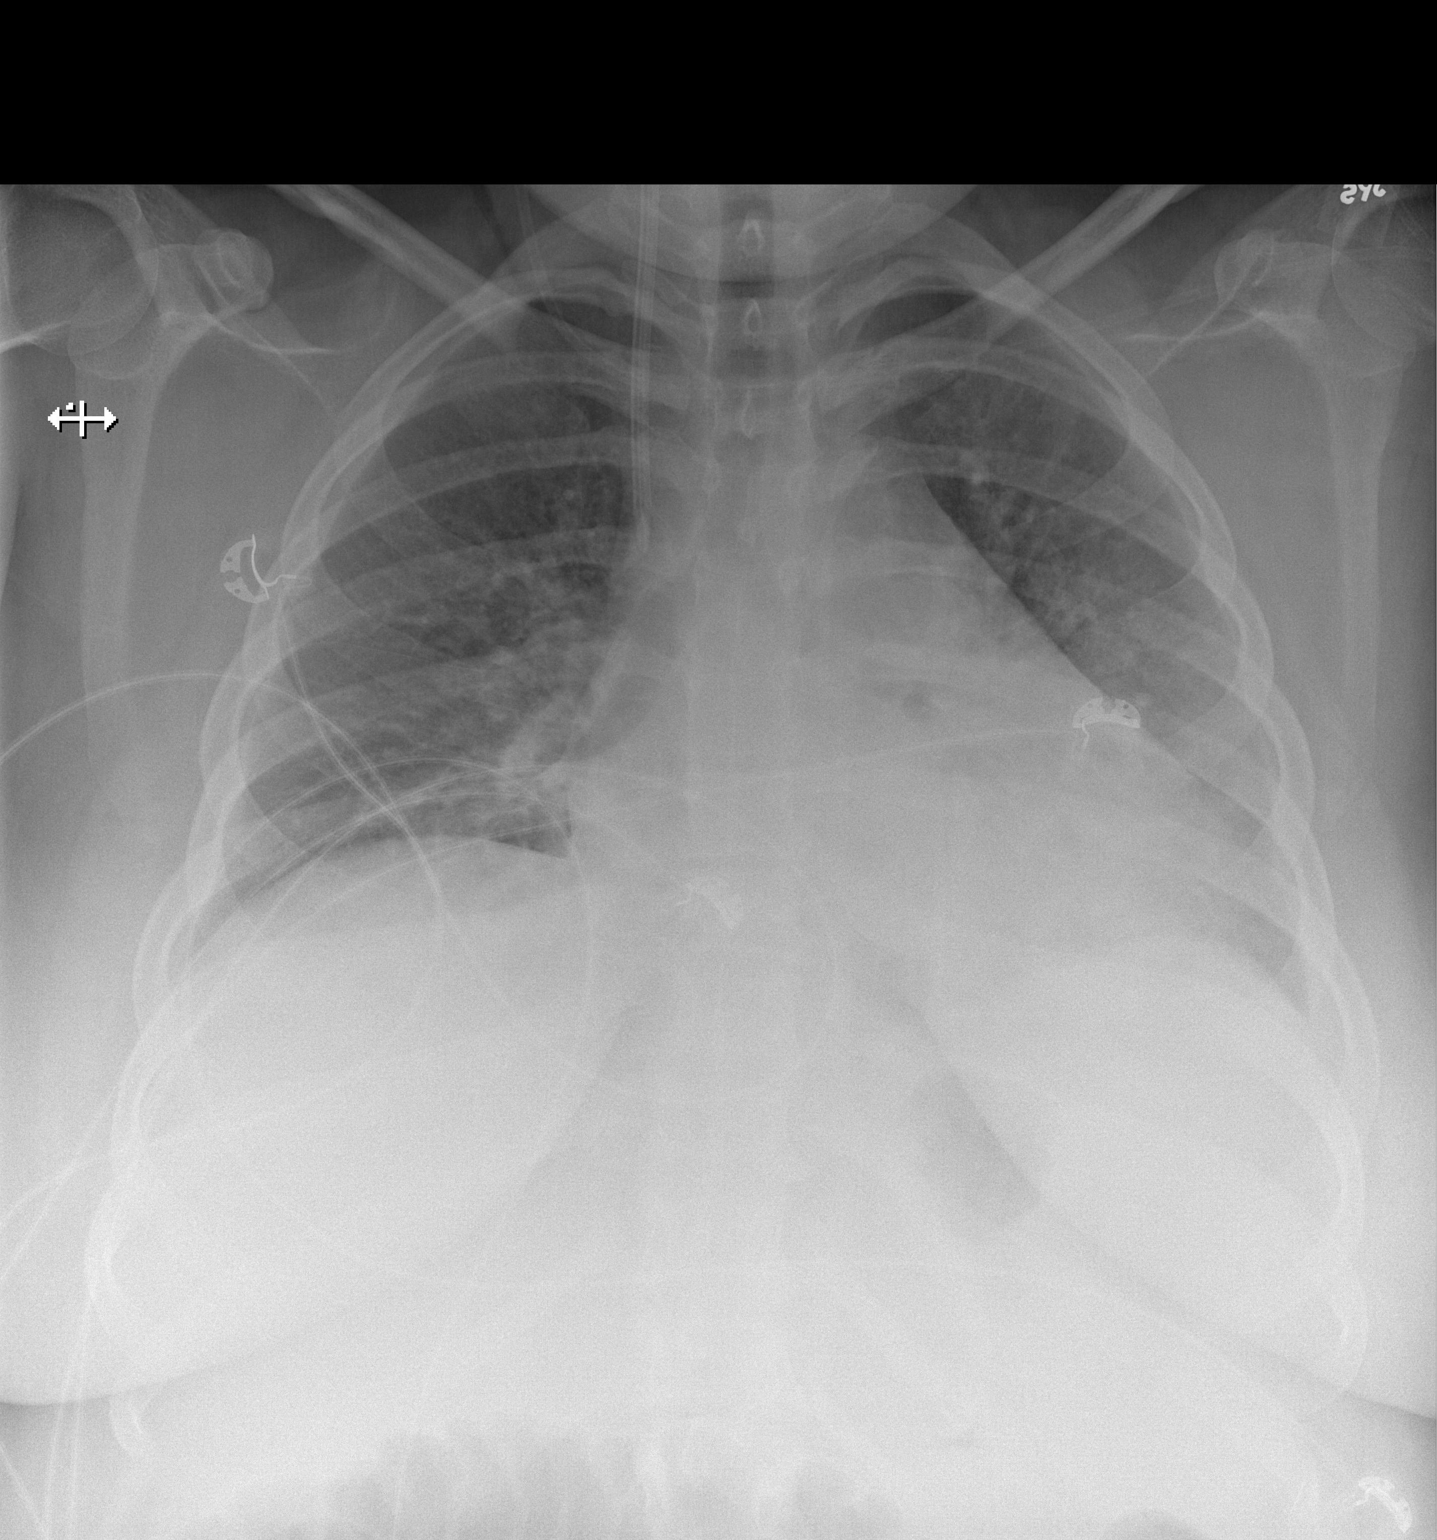

[w chest lat]
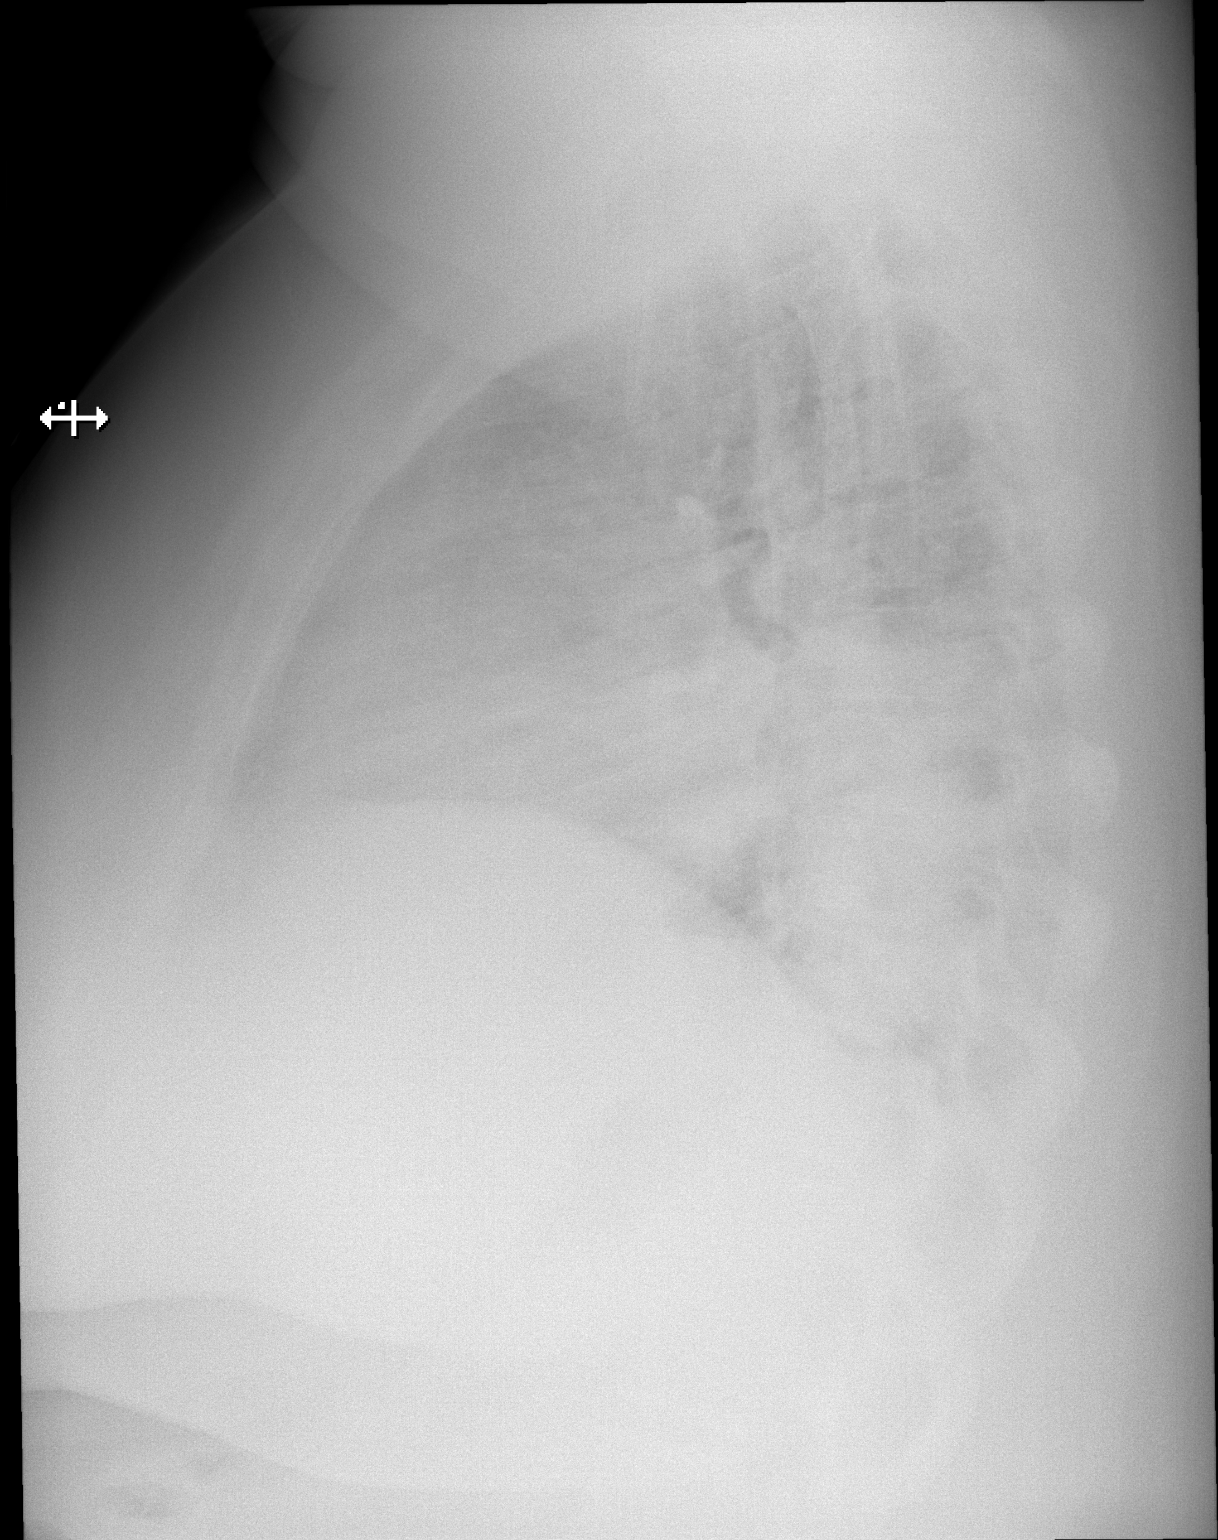

[2 of 2 positions shown; findings below may reference images not displayed]

FINDINGS: Right dialysis catheter remains in place, unchanged. Cardiomegaly
with vascular congestion and mild interstitial prominence and
perihilar opacities which likely reflects early edema. Small
bilateral effusions.
IMPRESSION: Cardiomegaly with vascular congestion. Suspect early edema. Small
effusions.

## 2019-06-24 HISTORY — PX: OTHER SURGICAL HISTORY: SHX169

## 2020-09-05 ENCOUNTER — Other Ambulatory Visit: Payer: Self-pay

## 2020-09-05 ENCOUNTER — Ambulatory Visit (INDEPENDENT_AMBULATORY_CARE_PROVIDER_SITE_OTHER): Payer: PRIVATE HEALTH INSURANCE | Admitting: Nurse Practitioner

## 2020-09-05 ENCOUNTER — Encounter: Payer: Self-pay | Admitting: Nurse Practitioner

## 2020-09-05 VITALS — BP 121/70 | HR 75 | Temp 97.9°F | Ht 67.0 in | Wt 259.0 lb

## 2020-09-05 DIAGNOSIS — G44201 Tension-type headache, unspecified, intractable: Secondary | ICD-10-CM | POA: Diagnosis not present

## 2020-09-05 DIAGNOSIS — Z0001 Encounter for general adult medical examination with abnormal findings: Secondary | ICD-10-CM

## 2020-09-05 DIAGNOSIS — Z0000 Encounter for general adult medical examination without abnormal findings: Secondary | ICD-10-CM

## 2020-09-05 DIAGNOSIS — Z6841 Body Mass Index (BMI) 40.0 and over, adult: Secondary | ICD-10-CM | POA: Diagnosis not present

## 2020-09-05 MED ORDER — PROPRANOLOL HCL 20 MG PO TABS: 20.0000 mg | ORAL_TABLET | Freq: Two times a day (BID) | ORAL | 0 refills | Status: DC

## 2020-09-05 MED ORDER — NURTEC 75 MG PO TBDP: 75.0000 mg | ORAL_TABLET | Freq: Once | ORAL | 0 refills | Status: DC | PRN

## 2020-09-05 NOTE — Assessment & Plan Note (Signed)
Patient is reporting no headaches after Covid 19 since April 2021.  Patient has been using Tylenol and ibuprofen with no relief.  Started patient on not tach 75 mg daily as needed. Patient follow-up with unresolved or worsening symptoms.  Medication refill sent to pharmacy.

## 2020-09-05 NOTE — Assessment & Plan Note (Signed)
Provided education to patient with printed handouts given.  Health maintenance, reduce calorie diet and exercise advised.  Current BMI 40.57kg/mm   We will continue to reassess.

## 2020-09-05 NOTE — Patient Instructions (Signed)
BMI for Adults What is BMI? Body mass index (BMI) is a number that is calculated from a person's weight and height. BMI can help estimate how much of a person's weight is composed of fat. BMI does not measure body fat directly. Rather, it is an alternative to procedures that directly measure body fat, which can be difficult and expensive. BMI can help identify people who may be at higher risk for certain medical problems. What are BMI measurements used for? BMI is used as a screening tool to identify possible weight problems. It helps determine whether a person is obese, overweight, a healthy weight, or underweight. BMI is useful for:  Identifying a weight problem that may be related to a medical condition or may increase the risk for medical problems.  Promoting changes, such as changes in diet and exercise, to help reach a healthy weight. BMI screening can be repeated to see if these changes are working. How is BMI calculated? BMI involves measuring your weight in relation to your height. Both height and weight are measured, and the BMI is calculated from those numbers. This can be done either in [place] ([place] or metric measurements. Note that charts and online BMI calculators are available to help you find your BMI quickly and easily without having to do these calculations yourself. To calculate your BMI in English (U[place] measurements:  1. Measure your weight in pounds (lb). 2. Multiply the number of pounds by 703. ? For example, for a person who weighs 180 lb, multiply that number by 703, which equals 126,540. 3. Measure your height in inches. Then multiply that number by itself to get a measurement called "inches squared." ? For example, for a person who is 70 inches tall, the "inches squared" measurement is 70 inches x 70 inches, which equals 4,900 inches squared. 4. Divide the total from step 2 (number of lb x 703) by the total from step 3 (inches squared): 126,540  4,900 = 25.8. This is  your BMI. To calculate your BMI in metric measurements: 1. Measure your weight in kilograms (kg). 2. Measure your height in meters (m). Then multiply that number by itself to get a measurement called "meters squared." ? For example, for a person who is 1.75 m tall, the "meters squared" measurement is 1.75 m x 1.75 m, which is equal to 3.1 meters squared. 3. Divide the number of kilograms (your weight) by the meters squared number. In this example: 70  3.1 = 22.6. This is your BMI. What do the results mean? BMI charts are used to identify whether you are underweight, normal weight, overweight, or obese. The following guidelines will be used:  Underweight: BMI less than 18.5.  Normal weight: BMI between 18.5 and 24.9.  Overweight: BMI between 25 and 29.9.  Obese: BMI of 30 or above. Keep these notes in mind:  Weight includes both fat and muscle, so someone with a muscular build, such as an athlete, may have a BMI that is higher than 24.9. In cases like these, BMI is not an accurate measure of body fat.  To determine if excess body fat is the cause of a BMI of 25 or higher, further assessments may need to be done by a health care provider.  BMI is usually interpreted in the same way for men and women. Where to find more information For more information about BMI, including tools to quickly calculate your BMI, go to these websites:  Centers for Disease Control and Prevention: http://www.wolf.info/  American  Heart Association: www.heart.org  National Heart, Lung, and Blood Institute: PopSteam.iswww.nhlbi.nih.gov Summary  Body mass index (BMI) is a number that is calculated from a person's weight and height.  BMI may help estimate how much of a person's weight is composed of fat. BMI can help identify those who may be at higher risk for certain medical problems.  BMI can be measured using English measurements or metric measurements.  BMI charts are used to identify whether you are underweight, normal  weight, overweight, or obese. This information is not intended to replace advice given to you by your health care provider. Make sure you discuss any questions you have with your health care provider. Document Revised: 09/02/2019 Document Reviewed: 07/10/2019 Elsevier Patient Education  2020 Elsevier Inc. Tension Headache, Adult A tension headache is pain, pressure, or aching in your head. Tension headaches can last from 30 minutes to several days. Follow these instructions at home: Managing pain  Take over-the-counter and prescription medicines only as told by your doctor.  When you have a headache, lie down in a dark, quiet room.  If told, put ice on your head and neck: ? Put ice in a plastic bag. ? Place a towel between your skin and the bag. ? Leave the ice on for 20 minutes, 2-3 times a day.  If told, put heat on the back of your neck. Do this as often as your doctor tells you to. Use the kind of heat that your doctor recommends, such as a moist heat pack or a heating pad. ? Place a towel between your skin and the heat. ? Leave the heat on for 20-30 minutes. ? Remove the heat if your skin turns bright red. Eating and drinking  Eat meals on a regular schedule.  Watch how much alcohol you drink: ? If you are a woman and are not pregnant, do not drink more than 1 drink a day. ? If you are a man, do not drink more than 2 drinks a day.  Drink enough fluid to keep your pee (urine) pale yellow.  Do not use a lot of caffeine, or stop using caffeine. Lifestyle  Get enough sleep. Get 7-9 hours of sleep each night. Or get the amount of sleep that your doctor tells you to.  At bedtime, remove all electronic devices from your room. Examples of electronic devices are computers, phones, and tablets.  Find ways to lessen your stress. Some things that can lessen stress are: ? Exercise. ? Deep breathing. ? Yoga. ? Music. ? Positive thoughts.  Sit up straight. Do not tighten (tense)  your muscles.  Do not use any products that have nicotine or tobacco in them, such as cigarettes and e-cigarettes. If you need help quitting, ask your doctor. General instructions   Keep all follow-up visits as told by your doctor. This is important.  Avoid things that can bring on headaches. Keep a journal to find out if certain things bring on headaches. For example, write down: ? What you eat and drink. ? How much sleep you get. ? Any change to your diet or medicines. Contact a doctor if:  Your headache does not get better.  Your headache comes back.  You have a headache and sounds, light, or smells bother you.  You feel sick to your stomach (nauseous) or you throw up (vomit).  Your stomach hurts. Get help right away if:  You suddenly get a very bad headache along with any of these: ? A stiff neck. ?  Feeling sick to your stomach. ? Throwing up. ? Feeling weak. ? Trouble seeing. ? Feeling short of breath. ? A rash. ? Feeling unusually sleepy. ? Trouble speaking. ? Pain in your eye or ear. ? Trouble walking or balancing. ? Feeling like you will pass out (faint). ? Passing out. Summary  A tension headache is pain, pressure, or aching in your head.  Tension headaches can last from 30 minutes to several days.  Lifestyle changes and medicines may help relieve pain. This information is not intended to replace advice given to you by your health care provider. Make sure you discuss any questions you have with your health care provider. Document Revised: 10/07/2019 Document Reviewed: 03/22/2017 Elsevier Patient Education  2020 [employer]. Health Maintenance, Female Adopting a healthy lifestyle and getting preventive care are important in promoting health and wellness. Ask your health care provider about:  The right schedule for you to have regular tests and exams.  Things you can do on your own to prevent diseases and keep yourself healthy. What should I know about  diet, weight, and exercise? Eat a healthy diet   Eat a diet that includes plenty of vegetables, fruits, low-fat dairy products, and lean protein.  Do not eat a lot of foods that are high in solid fats, added sugars, or sodium. Maintain a healthy weight Body mass index (BMI) is used to identify weight problems. It estimates body fat based on height and weight. Your health care provider can help determine your BMI and help you achieve or maintain a healthy weight. Get regular exercise Get regular exercise. This is one of the most important things you can do for your health. Most adults should:  Exercise for at least 150 minutes each week. The exercise should increase your heart rate and make you sweat (moderate-intensity exercise).  Do strengthening exercises at least twice a week. This is in addition to the moderate-intensity exercise.  Spend less time sitting. Even light physical activity can be beneficial. Watch cholesterol and blood lipids Have your blood tested for lipids and cholesterol at 28 years of age, then have this test every 5 years. Have your cholesterol levels checked more often if:  Your lipid or cholesterol levels are high.  You are older than 28 years of age.  You are at high risk for heart disease. What should I know about cancer screening? Depending on your health history and family history, you may need to have cancer screening at various ages. This may include screening for:  Breast cancer.  Cervical cancer.  Colorectal cancer.  Skin cancer.  Lung cancer. What should I know about heart disease, diabetes, and high blood pressure? Blood pressure and heart disease  High blood pressure causes heart disease and increases the risk of stroke. This is more likely to develop in people who have high blood pressure readings, are of African descent, or are overweight.  Have your blood pressure checked: ? Every 3-5 years if you are 47-34 years of age. ? Every year  if you are 34 years old or older. Diabetes Have regular diabetes screenings. This checks your fasting blood sugar level. Have the screening done:  Once every three years after age 57 if you are at a normal weight and have a low risk for diabetes.  More often and at a younger age if you are overweight or have a high risk for diabetes. What should I know about preventing infection? Hepatitis B If you have a higher risk for hepatitis  B, you should be screened for this virus. Talk with your health care provider to find out if you are at risk for hepatitis B infection. Hepatitis C Testing is recommended for:  Everyone born from [birth year] through 1964.  Anyone with known risk factors for hepatitis C. Sexually transmitted infections (STIs)  Get screened for STIs, including gonorrhea and chlamydia, if: ? You are sexually active and are younger than 28 years of age. ? You are older than 28 years of age and your health care provider tells you that you are at risk for this type of infection. ? Your sexual activity has changed since you were last screened, and you are at increased risk for chlamydia or gonorrhea. Ask your health care provider if you are at risk.  Ask your health care provider about whether you are at high risk for HIV. Your health care provider may recommend a prescription medicine to help prevent HIV infection. If you choose to take medicine to prevent HIV, you should first get tested for HIV. You should then be tested every 3 months for as long as you are taking the medicine. Pregnancy  If you are about to stop having your period (premenopausal) and you may become pregnant, seek counseling before you get pregnant.  Take 400 to 800 micrograms (mcg) of folic acid every day if you become pregnant.  Ask for birth control (contraception) if you want to prevent pregnancy. Osteoporosis and menopause Osteoporosis is a disease in which the bones lose minerals and strength with aging. This  can result in bone fractures. If you are 28 years old or older, or if you are at risk for osteoporosis and fractures, ask your health care provider if you should:  Be screened for bone loss.  Take a calcium or vitamin D supplement to lower your risk of fractures.  Be given hormone replacement therapy (HRT) to treat symptoms of menopause. Follow these instructions at home: Lifestyle  Do not use any products that contain nicotine or tobacco, such as cigarettes, e-cigarettes, and chewing tobacco. If you need help quitting, ask your health care provider.  Do not use street drugs.  Do not share needles.  Ask your health care provider for help if you need support or information about quitting drugs. Alcohol use  Do not drink alcohol if: ? Your health care provider tells you not to drink. ? You are pregnant, may be pregnant, or are planning to become pregnant.  If you drink alcohol: ? Limit how much you use to 0-1 drink a day. ? Limit intake if you are breastfeeding.  Be aware of how much alcohol is in your drink. In the [place].S., one drink equals one 12 oz bottle of beer (355 mL), one 5 oz glass of wine (148 mL), or one 1 oz glass of hard liquor (44 mL). General instructions  Schedule regular health, dental, and eye exams.  Stay current with your vaccines.  Tell your health care provider if: ? You often feel depressed. ? You have ever been abused or do not feel safe at home. Summary  Adopting a healthy lifestyle and getting preventive care are important in promoting health and wellness.  Follow your health care provider's instructions about healthy diet, exercising, and getting tested or screened for diseases.  Follow your health care provider's instructions on monitoring your cholesterol and blood pressure. This information is not intended to replace advice given to you by your health care provider. Make sure you discuss any questions you have  with your health care  provider. Document Revised: 12/03/2018 Document Reviewed: 12/03/2018 Elsevier Patient Education  2020 [employer].

## 2020-09-05 NOTE — Assessment & Plan Note (Signed)
Patient is a 29 year old female who presents to clinic for annual physical exam and establishing care.  Patient has no new concerns.  Head to toe assessment completed, medication reconciliation completed changes made to new medication/discontinued medication. Provided education on health maintenance, preventative care.  Patient is not willing to do a Pap smear today, last Pap was done over 5 years ago. Printed handouts given to patient.  Labs completed today/pending [CBC, CMP, lipid panel].  Follow-up in 1 year

## 2020-09-05 NOTE — Progress Notes (Addendum)
New Patient Office Visit  Subjective:  Patient ID: Veronica Waters, female    DOB: 22-May-1991  Age: 29 y.o. MRN: [id number]  CC:  Chief Complaint  Patient presents with  . Establish Care    HPI Veronica Waters presents for .   Encounter for general adult medical examination without abnormal findings  Physical: Patient's last physical exam was 1 year ago .  Weight: not appropriate for height (BMI less than 27%) ;  Blood Pressure: Normal (BP less than 120/80) ;  Medical History: Patient history reviewed ; Family history reviewed ;  Allergies Reviewed: No change in current allergies ;  Medications Reviewed: Medications reviewed -updated current medication. 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 Dental Care: biannual cleanings, brushes and flosses daily. Ophthalmology/Optometry: Annual visit. Patient wears glasses Hearing loss: none Vision impairments: none  Past Medical History:  Diagnosis Date  . Asthma   . Hypertension   . Major depressive disorder     Past Surgical History:  Procedure Laterality Date  . kidney stone extraction  06/2019    Family History  Problem Relation Age of Onset  . Alcohol abuse Father     Social History   Socioeconomic History  . Marital status: Divorced    Spouse name: Not on file  . Number of children: Not on file  . Years of education: 58  . Highest education level: 12th grade  Occupational History  . Occupation: cna  Tobacco Use  . Smoking status: Former Games developer  . Smokeless tobacco: Never Used    Vaping Use  . Vaping Use: Never used  Substance and Sexual Activity  . Alcohol use: Not Currently  . 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 Sexual activity: Not Currently    Birth control/protection: Other-see comments    Comment: Rod in left arm for birth control  Other Topics Concern  . Not on file  Social History Narrative  . Not on file   Social Determinants of Health                                                                         ROS Review of Systems  Constitutional: Negative for activity change and appetite change.  Respiratory: Negative.   Cardiovascular: Negative.   Gastrointestinal: Negative.   Genitourinary: Negative.   Musculoskeletal: Negative.   Skin: Negative.  Negative for rash.  Neurological: Negative.   Psychiatric/Behavioral: Negative for self-injury, sleep disturbance and suicidal ideas. The patient is not nervous/anxious and is not hyperactive.   All other systems reviewed and are negative.   Objective:   Today's Vitals: BP 121/70   Pulse 75   Temp 97.9 F (36.6 C)   Ht 5\' 7"  (1.702 m)   Wt 259 lb (117.5 kg)  LMP 08/22/2020   SpO2 99%   BMI 40.57 kg/m   Physical Exam Vitals reviewed.  Constitutional:      Appearance: Normal appearance.  HENT:     Head: Normocephalic.     Right Ear: External ear normal. There is no impacted cerumen.     Left Ear: External ear normal. There is no impacted cerumen.     Mouth/Throat:     Pharynx: Oropharynx is clear. No oropharyngeal exudate or posterior oropharyngeal erythema.  Eyes:     Conjunctiva/sclera: Conjunctivae normal.  Cardiovascular:     Rate and Rhythm: Normal rate and regular rhythm.     Pulses: Normal pulses.     Heart sounds: Normal heart sounds.  Pulmonary:     Effort: Pulmonary effort is normal.     Breath sounds: Normal breath sounds.  Abdominal:     General: Bowel sounds are normal.  Musculoskeletal:     Cervical back: Normal  range of motion and neck supple.  Skin:    General: Skin is warm.  Neurological:     Mental Status: She is alert and oriented to person, place, and time.  Psychiatric:        Mood and Affect: Mood normal.        Behavior: Behavior normal.     Assessment & Plan:   Problem List Items Addressed This Visit      Other   Annual physical exam - Primary    Patient is a 29 year old female who presents to clinic for annual physical exam and establishing care.  Patient has no new concerns.  Head to toe assessment completed, medication reconciliation completed changes made to new medication/discontinued medication. Provided education on health maintenance, preventative care.  Patient is not willing to do a Pap smear today, last Pap was done over 5 years ago. Printed handouts given to patient.  Labs completed today/pending [CBC, CMP, lipid panel].  Follow-up in 1 year      Relevant Orders   CBC with Differential   Comprehensive metabolic panel   Lipid Panel   BMI 40.0-44.9, adult [facility])    Provided education to patient with printed handouts given.  Health maintenance, reduce calorie diet and exercise advised.  Current BMI 40.57kg/mm   We will continue to reassess.      Acute intractable tension-type headache    Patient is reporting no headaches after Covid 19 since April 2021.  Patient has been using Tylenol and ibuprofen with no relief.  Started patient on not tach 75 mg daily as needed. Patient follow-up with unresolved or worsening symptoms.  Medication refill sent to pharmacy.      Relevant Medications   Rimegepant Sulfate (NURTEC) 75 MG TBDP   propranolol (INDERAL) 20 MG tablet      Outpatient Encounter Medications as of 09/05/2020  Medication Sig  . albuterol (PROVENTIL HFA;VENTOLIN HFA) 108 (90 Base) MCG/ACT inhaler Inhale 1-2 puffs into the lungs every 6 (six) hours as needed for wheezing or shortness of breath.  . Ascorbic Acid (VITAMIN C) 1000 MG tablet Take 2,000 mg  by mouth daily.  . Cholecalciferol (VITAMIN D) 50 MCG (2000 UT) CAPS Take by mouth.  . Multiple Vitamins-Minerals (MULTIVITAMIN WOMEN PO) Take by mouth.  . propranolol (INDERAL) 20 MG tablet Take 1 tablet (20 mg total) by mouth 2 (two) times daily.  . Zinc-Vitamin C (ZINC-A-COLD/VITAMIN C MT) Use as directed in the mouth or throat.  . furosemide (LASIX) 20 MG tablet Take 1 tablet (20 mg total) by  mouth daily for 3 days.  . [DISCONTINUED] buPROPion (WELLBUTRIN XL) 150 MG 24 hr tablet Take 1 tablet (150 mg total) by mouth daily.  . [DISCONTINUED] folic acid (FOLVITE) 1 MG tablet Take 1 tablet (1 mg total) by mouth daily.  . [DISCONTINUED] hydrOXYzine (ATARAX/VISTARIL) 25 MG tablet Take 1 tablet (25 mg total) by mouth every 6 (six) hours as needed for anxiety.  . [DISCONTINUED] levothyroxine (SYNTHROID, LEVOTHROID) 50 MCG tablet Take 1 tablet (50 mcg total) by mouth daily before breakfast.  . [DISCONTINUED] lisinopril (PRINIVIL,ZESTRIL) 10 MG tablet Take 1 tablet (10 mg total) by mouth daily.  . [DISCONTINUED] lithium carbonate 600 MG capsule Take 1 capsule (600 mg total) by mouth at bedtime.  . [DISCONTINUED] Melatonin 3 MG TABS Take 3 mg by mouth daily as needed.  . [DISCONTINUED] metFORMIN (GLUCOPHAGE-XR) 500 MG 24 hr tablet Take 500 mg by mouth daily with breakfast.  . [DISCONTINUED] QUEtiapine (SEROQUEL) 400 MG tablet Take 1 tablet (400 mg total) by mouth at bedtime.   No facility-administered encounter medications on file as of 09/05/2020.    Follow-up: Return in about 3 months (around 12/05/2020).   Daryll Drown, NP

## 2020-09-06 LAB — COMPREHENSIVE METABOLIC PANEL
ALT: 12 IU/L (ref 0–32)
AST: 9 IU/L (ref 0–40)
Albumin/Globulin Ratio: 1.3 (ref 1.2–2.2)
Albumin: 3.9 g/dL (ref 3.9–5.0)
Alkaline Phosphatase: 74 IU/L (ref 44–121)
BUN/Creatinine Ratio: 15 (ref 9–23)
BUN: 10 mg/dL (ref 6–20)
Bilirubin Total: 0.3 mg/dL (ref 0.0–1.2)
CO2: 22 mmol/L (ref 20–29)
Calcium: 9 mg/dL (ref 8.7–10.2)
Chloride: 105 mmol/L (ref 96–106)
Creatinine, Ser: 0.66 mg/dL (ref 0.57–1.00)
GFR calc Af Amer: 139 mL/min/{1.73_m2} (ref >59)
GFR calc non Af Amer: 121 mL/min/{1.73_m2} (ref >59)
Globulin, Total: 3 g/dL (ref 1.5–4.5)
Glucose: 74 mg/dL (ref 65–99)
Potassium: 4.5 mmol/L (ref 3.5–5.2)
Sodium: 140 mmol/L (ref 134–144)
Total Protein: 6.9 g/dL (ref 6.0–8.5)

## 2020-09-06 LAB — LIPID PANEL
Chol/HDL Ratio: 5.5 ratio — ABNORMAL HIGH (ref 0.0–4.4)
Cholesterol, Total: 203 mg/dL — ABNORMAL HIGH (ref 100–199)
HDL: 37 mg/dL — ABNORMAL LOW (ref >39)
LDL Chol Calc (NIH): 133 mg/dL — ABNORMAL HIGH (ref 0–99)
Triglycerides: 185 mg/dL — ABNORMAL HIGH (ref 0–149)
VLDL Cholesterol Cal: 33 mg/dL (ref 5–40)

## 2020-09-06 LAB — CBC WITH DIFFERENTIAL/PLATELET
Basophils Absolute: 0.1 10*3/uL (ref 0.0–0.2)
Basos: 1 %
EOS (ABSOLUTE): 0.2 10*3/uL (ref 0.0–0.4)
Eos: 2 %
Hematocrit: 39.1 % (ref 34.0–46.6)
Hemoglobin: 12.9 g/dL (ref 11.1–15.9)
Immature Grans (Abs): 0 10*3/uL (ref 0.0–0.1)
Immature Granulocytes: 0 %
Lymphocytes Absolute: 1.8 10*3/uL (ref 0.7–3.1)
Lymphs: 28 %
MCH: 27.7 pg (ref 26.6–33.0)
MCHC: 33 g/dL (ref 31.5–35.7)
MCV: 84 fL (ref 79–97)
Monocytes Absolute: 0.4 10*3/uL (ref 0.1–0.9)
Monocytes: 6 %
Neutrophils Absolute: 3.9 10*3/uL (ref 1.4–7.0)
Neutrophils: 63 %
Platelets: 328 10*3/uL (ref 150–450)
RBC: 4.65 x10E6/uL (ref 3.77–5.28)
RDW: 12.9 % (ref 11.7–15.4)
WBC: 6.3 10*3/uL (ref 3.4–10.8)

## 2020-09-07 ENCOUNTER — Encounter: Payer: Self-pay | Admitting: Nurse Practitioner

## 2020-09-13 ENCOUNTER — Other Ambulatory Visit: Payer: Self-pay | Admitting: Nurse Practitioner

## 2020-09-14 MED ORDER — NURTEC 75 MG PO TBDP: 75.0000 mg | ORAL_TABLET | Freq: Once | ORAL | 0 refills | Status: DC | PRN

## 2020-09-14 NOTE — Telephone Encounter (Signed)
[facility] to [facility]

## 2020-09-15 ENCOUNTER — Telehealth: Payer: Self-pay | Admitting: Nurse Practitioner

## 2020-09-15 MED ORDER — NURTEC 75 MG PO TBDP: 75.0000 mg | ORAL_TABLET | Freq: Once | ORAL | 0 refills | Status: DC | PRN

## 2020-09-15 NOTE — Telephone Encounter (Signed)
  Prescription Request  09/15/2020  What is the name of the medication or equipment? Nortec  Have you contacted your pharmacy to request a refill? (if applicable) Yes  Which pharmacy would you like this sent to? [facility]   Patient notified that their request is being sent to the clinical staff for review and that they should receive a response within 2 business days.    Je's pt.  [facility]

## 2020-09-15 NOTE — Telephone Encounter (Signed)
Refill [facility] to [facility], refill from other day said No Print

## 2020-09-20 ENCOUNTER — Encounter: Payer: Self-pay | Admitting: *Deleted

## 2020-10-06 ENCOUNTER — Other Ambulatory Visit: Payer: Self-pay | Admitting: Nurse Practitioner

## 2020-10-07 MED ORDER — PROPRANOLOL HCL 20 MG PO TABS: 20.0000 mg | ORAL_TABLET | Freq: Two times a day (BID) | ORAL | 0 refills | Status: DC

## 2020-10-12 ENCOUNTER — Telehealth: Payer: Self-pay

## 2020-10-12 MED ORDER — PROPRANOLOL HCL 20 MG PO TABS: 20.0000 mg | ORAL_TABLET | Freq: Two times a day (BID) | ORAL | 0 refills | Status: DC

## 2020-10-12 NOTE — Telephone Encounter (Signed)
Medication sent- left detailed message

## 2020-10-12 NOTE — Telephone Encounter (Signed)
  Prescription Request  10/12/2020  What is the name of the medication or equipment? propranolol (INDERAL) 20 MG tablet    Have you contacted your pharmacy to request a refill? (if applicable) yes  Which pharmacy would you like this sent [facility]? [facility], pt it completely out of this medication pharmacy had to give her a three day supply   Patient notified that their request is being sent to the clinical staff for review and that they should receive a response within 2 business days.

## 2020-10-14 ENCOUNTER — Telehealth: Payer: Medicaid Other | Admitting: Nurse Practitioner

## 2020-10-14 DIAGNOSIS — B9789 Other viral agents as the cause of diseases classified elsewhere: Secondary | ICD-10-CM

## 2020-10-14 DIAGNOSIS — J019 Acute sinusitis, unspecified: Secondary | ICD-10-CM

## 2020-10-14 MED ORDER — FLUTICASONE PROPIONATE 50 MCG/ACT NA SUSP: 2.0000 | Freq: Every day | NASAL | 6 refills | Status: DC

## 2020-10-14 NOTE — Progress Notes (Signed)

## 2020-10-15 ENCOUNTER — Telehealth: Payer: Medicaid Other | Admitting: Nurse Practitioner

## 2020-10-15 DIAGNOSIS — B9789 Other viral agents as the cause of diseases classified elsewhere: Secondary | ICD-10-CM

## 2020-10-15 DIAGNOSIS — J019 Acute sinusitis, unspecified: Secondary | ICD-10-CM

## 2020-10-15 NOTE — Progress Notes (Signed)
We are sorry that you are not feeling well.  Here is how we plan to help!  Based on what you have shared with me it looks like you have sinusitis.  Sinusitis is inflammation and infection in the sinus cavities of the head.  Based on your presentation I believe you most likely have Acute Viral Sinusitis.This is an infection most likely caused by a virus. There is not specific treatment for viral sinusitis other than to help you with the symptoms until the infection runs its course.  You may use an oral decongestant such as Mucinex D or if you have glaucoma or high blood pressure use plain Mucinex. Saline nasal spray help and can safely be used as often as needed for congestion. You can also use a humidifier to help keep  Nose moist which may help stop nose bleeds. Some authorities believe that zinc sprays or the use of Echinacea may shorten the course of your symptoms.  Sinus infections are not as easily transmitted as other respiratory infection, however we still recommend that you avoid close contact with loved ones, especially the very young and elderly.  Remember to wash your hands thoroughly throughout the day as this is the number one way to prevent the spread of infection!  Home Care:  Only take medications as instructed by your medical team.  Do not take these medications with alcohol.  A steam or ultrasonic humidifier can help congestion.  You can place a towel over your head and breathe in the steam from hot water coming from a faucet.  Avoid close contacts especially the very young and the elderly.  Cover your mouth when you cough or sneeze.  Always remember to wash your hands.  Get Help Right Away If:  You develop worsening fever or sinus pain.  You develop a severe head ache or visual changes.  Your symptoms persist after you have completed your treatment plan.  Make sure you  Understand these instructions.  Will watch your condition.  Will get help right away if you  are not doing well or get worse.  Your e-visit answers were reviewed by a board certified advanced clinical practitioner to complete your personal care plan.  Depending on the condition, your plan could have included both over the counter or prescription medications.  If there is a problem please reply  once you have received a response from your provider.  Your safety is important to [place].  If you have drug allergies check your prescription carefully.    You can use MyChart to ask questions about today's visit, request a non-urgent call back, or ask for a work or school excuse for 24 hours related to this e-Visit. If it has been greater than 24 hours you will need to follow up with your provider, or enter a new e-Visit to address those concerns.  You will get an e-mail in the next two days asking about your experience.  I hope that your e-visit has been valuable and will speed your recovery. Thank you for using e-visits.   5-10 minutes spent reviewing and documenting in chart.

## 2020-10-20 ENCOUNTER — Ambulatory Visit: Payer: PRIVATE HEALTH INSURANCE | Admitting: Nurse Practitioner

## 2020-11-28 ENCOUNTER — Telehealth: Payer: Self-pay

## 2020-11-28 NOTE — Telephone Encounter (Signed)
Advised patient that we could not call in any meds until appointment advised she can take benadryl otc but it can cause drowsiness. Patient states she has already done that and has not helped. Told patient she would have to wait for the appointment.

## 2020-12-02 ENCOUNTER — Ambulatory Visit: Payer: PRIVATE HEALTH INSURANCE | Admitting: Nurse Practitioner

## 2020-12-07 ENCOUNTER — Encounter: Payer: Self-pay | Admitting: Nurse Practitioner

## 2020-12-07 ENCOUNTER — Ambulatory Visit: Payer: PRIVATE HEALTH INSURANCE | Admitting: Nurse Practitioner

## 2020-12-07 ENCOUNTER — Other Ambulatory Visit: Payer: Self-pay

## 2020-12-07 ENCOUNTER — Ambulatory Visit (INDEPENDENT_AMBULATORY_CARE_PROVIDER_SITE_OTHER): Payer: PRIVATE HEALTH INSURANCE | Admitting: Nurse Practitioner

## 2020-12-07 VITALS — BP 129/78 | HR 90 | Temp 98.1°F | Resp 20 | Ht 67.0 in | Wt 284.0 lb

## 2020-12-07 DIAGNOSIS — E1169 Type 2 diabetes mellitus with other specified complication: Secondary | ICD-10-CM | POA: Diagnosis not present

## 2020-12-07 DIAGNOSIS — G44201 Tension-type headache, unspecified, intractable: Secondary | ICD-10-CM

## 2020-12-07 DIAGNOSIS — E669 Obesity, unspecified: Secondary | ICD-10-CM

## 2020-12-07 DIAGNOSIS — F419 Anxiety disorder, unspecified: Secondary | ICD-10-CM | POA: Diagnosis not present

## 2020-12-07 MED ORDER — HYDROXYZINE HCL 10 MG PO TABS: 10.0000 mg | ORAL_TABLET | Freq: Three times a day (TID) | ORAL | 0 refills | Status: DC | PRN

## 2020-12-07 MED ORDER — NURTEC 75 MG PO TBDP: 75.0000 mg | ORAL_TABLET | Freq: Once | ORAL | 0 refills | Status: DC | PRN

## 2020-12-07 NOTE — Patient Instructions (Signed)
Diabetes Basics  Diabetes (diabetes mellitus) is a long-term (chronic) disease. It occurs when the body does not properly use sugar (glucose) that is released from food after you eat. Diabetes may be caused by one or both of these problems:  Your pancreas does not make enough of a hormone called insulin.  Your body does not react in a normal way to insulin that it makes. Insulin lets sugars (glucose) go into cells in your body. This gives you energy. If you have diabetes, sugars cannot get into cells. This causes high blood sugar (hyperglycemia). Follow these instructions at home: How is diabetes treated? You may need to take insulin or other diabetes medicines daily to keep your blood sugar in balance. Take your diabetes medicines every day as told by your doctor. List your diabetes medicines here: Diabetes medicines  Name of medicine: ______________________________ ? Amount (dose): _______________ Time (a.m./p.m.): _______________ Notes: ___________________________________  Name of medicine: ______________________________ ? Amount (dose): _______________ Time (a.m./p.m.): _______________ Notes: ___________________________________  Name of medicine: ______________________________ ? Amount (dose): _______________ Time (a.m./p.m.): _______________ Notes: ___________________________________ If you use insulin, you will learn how to give yourself insulin by injection. You may need to adjust the amount based on the food that you eat. List the types of insulin you use here: Insulin  Insulin type: ______________________________ ? Amount (dose): _______________ Time (a.m./p.m.): _______________ Notes: ___________________________________  Insulin type: ______________________________ ? Amount (dose): _______________ Time (a.m./p.m.): _______________ Notes: ___________________________________  Insulin type: ______________________________ ? Amount (dose): _______________ Time (a.m./p.m.):  _______________ Notes: ___________________________________  Insulin type: ______________________________ ? Amount (dose): _______________ Time (a.m./p.m.): _______________ Notes: ___________________________________  Insulin type: ______________________________ ? Amount (dose): _______________ Time (a.m./p.m.): _______________ Notes: ___________________________________ How do I manage my blood sugar?  Check your blood sugar levels using a blood glucose monitor as directed by your doctor. Your doctor will set treatment goals for you. Generally, you should have these blood sugar levels:  Before meals (preprandial): 80-130 mg/dL (4.4-7.2 mmol/L).  After meals (postprandial): below 180 mg/dL (10 mmol/L).  A1c level: less than 7%. Write down the times that you will check your blood sugar levels: Blood sugar checks  Time: _______________ Notes: ___________________________________  Time: _______________ Notes: ___________________________________  Time: _______________ Notes: ___________________________________  Time: _______________ Notes: ___________________________________  Time: _______________ Notes: ___________________________________  Time: _______________ Notes: ___________________________________  What do I need to know about low blood sugar? Low blood sugar is called hypoglycemia. This is when blood sugar is at or below 70 mg/dL (3.9 mmol/L). Symptoms may include:  Feeling: ? Hungry. ? Worried or nervous (anxious). ? Sweaty and clammy. ? Confused. ? Dizzy. ? Sleepy. ? Sick to your stomach (nauseous).  Having: ? A fast heartbeat. ? A headache. ? A change in your vision. ? Tingling or no feeling (numbness) around the mouth, lips, or tongue. ? Jerky movements that you cannot control (seizure).  Having trouble with: ? Moving (coordination). ? Sleeping. ? Passing out (fainting). ? Getting upset easily (irritability). Treating low blood sugar To treat low blood  sugar, eat or drink something sugary right away. If you can think clearly and swallow safely, follow the 15:15 rule:  Take 15 grams of a fast-acting carb (carbohydrate). Talk with your doctor about how much you should take.  Some fast-acting carbs are: ? Sugar tablets (glucose pills). Take 3-4 glucose pills. ? 6-8 pieces of hard candy. ? 4-6 oz (120-150 mL) of fruit juice. ? 4-6 oz (120-150 mL) of regular (not diet) soda. ? 1 Tbsp (15 mL) honey or sugar.    Check your blood sugar 15 minutes after you take the carb.  If your blood sugar is still at or below 70 mg/dL (3.9 mmol/L), take 15 grams of a carb again.  If your blood sugar does not go above 70 mg/dL (3.9 mmol/L) after 3 tries, get help right away.  After your blood sugar goes back to normal, eat a meal or a snack within 1 hour. Treating very low blood sugar If your blood sugar is at or below 54 mg/dL (3 mmol/L), you have very low blood sugar (severe hypoglycemia). This is an emergency. Do not wait to see if the symptoms will go away. Get medical help right away. Call your local emergency services (911 in the [place] Do not drive yourself to the hospital. Questions to ask your health care provider  Do I need to meet with a diabetes educator?  What equipment will I need to care for myself at home?  What diabetes medicines do I need? When should I take them?  How often do I need to check my blood sugar?  What number can I call if I have questions?  When is my next doctor's visit?  Where can I find a support group for people with diabetes? Where to find more information  American Diabetes Association: www.diabetes.org  American Association of Diabetes Educators: www.diabeteseducator.org/patient-resources Contact a doctor if:  Your blood sugar is at or above 240 mg/dL (13.3 mmol/L) for 2 days in a row.  You have been sick or have had a fever for 2 days or more, and you are not getting better.  You have any of these  problems for more than 6 hours: ? You cannot eat or drink. ? You feel sick to your stomach (nauseous). ? You throw up (vomit). ? You have watery poop (diarrhea). Get help right away if:  Your blood sugar is lower than 54 mg/dL (3 mmol/L).  You get confused.  You have trouble: ? Thinking clearly. ? Breathing. Summary  Diabetes (diabetes mellitus) is a long-term (chronic) disease. It occurs when the body does not properly use sugar (glucose) that is released from food after digestion.  Take insulin and diabetes medicines as told.  Check your blood sugar every day, as often as told.  Keep all follow-up visits as told by your doctor. This is important. This information is not intended to replace advice given to you by your health care provider. Make sure you discuss any questions you have with your health care provider. Document Revised: 09/02/2019 Document Reviewed: 03/14/2018 Elsevier Patient Education  2020 [employer]  Generalized Anxiety Disorder, Adult Generalized anxiety disorder (GAD) is a mental health disorder. People with this condition constantly worry about everyday events. Unlike normal anxiety, worry related to GAD is not triggered by a specific event. These worries also do not fade or get better with time. GAD interferes with life functions, including relationships, work, and school. GAD can vary from mild to severe. People with severe GAD can have intense waves of anxiety with physical symptoms (panic attacks). What are the causes? The exact cause of GAD is not known. What increases the risk? This condition is more likely to develop in:  Women.  People who have a family history of anxiety disorders.  People who are very shy.  People who experience very stressful life events, such as the death of a loved one.  People who have a very stressful family environment. What are the signs or symptoms? People with GAD often worry excessively   many things in their  lives, such as their health and family. They may also be overly concerned about:  Doing well at work.  Being on time.  Natural disasters.  Friendships. Physical symptoms of GAD include:  Fatigue.  Muscle tension or having muscle twitches.  Trembling or feeling shaky.  Being easily startled.  Feeling like your heart is pounding or racing.  Feeling out of breath or like you cannot take a deep breath.  Having trouble falling asleep or staying asleep.  Sweating.  Nausea, diarrhea, or irritable bowel syndrome (IBS).  Headaches.  Trouble concentrating or remembering facts.  Restlessness.  Irritability. How is this diagnosed? Your health care provider can diagnose GAD based on your symptoms and medical history. You will also have a physical exam. The health care provider will ask specific questions about your symptoms, including how severe they are, when they started, and if they come and go. Your health care provider may ask you about your use of alcohol or drugs, including prescription medicines. Your health care provider may refer you to a mental health specialist for further evaluation. Your health care provider will do a thorough examination and may perform additional tests to rule out other possible causes of your symptoms. To be diagnosed with GAD, a person must have anxiety that:  Is out of his or her control.  Affects several different aspects of his or her life, such as work and relationships.  Causes distress that makes him or her unable to take part in normal activities.  Includes at least three physical symptoms of GAD, such as restlessness, fatigue, trouble concentrating, irritability, muscle tension, or sleep problems. Before your health care provider can confirm a diagnosis of GAD, these symptoms must be present more days than they are not, and they must last for six months or longer. How is this treated? The following therapies are usually used to treat  GAD:  Medicine. Antidepressant medicine is usually prescribed for long-term daily control. Antianxiety medicines may be added in severe cases, especially when panic attacks occur.  Talk therapy (psychotherapy). Certain types of talk therapy can be helpful in treating GAD by providing support, education, and guidance. Options include: ? Cognitive behavioral therapy (CBT). People learn coping skills and techniques to ease their anxiety. They learn to identify unrealistic or negative thoughts and behaviors and to replace them with positive ones. ? Acceptance and commitment therapy (ACT). This treatment teaches people how to be mindful as a way to cope with unwanted thoughts and feelings. ? Biofeedback. This process trains you to manage your body's response (physiological response) through breathing techniques and relaxation methods. You will work with a therapist while machines are used to monitor your physical symptoms.  Stress management techniques. These include yoga, meditation, and exercise. A mental health specialist can help determine which treatment is best for you. Some people see improvement with one type of therapy. However, other people require a combination of therapies. Follow these instructions at home:  Take over-the-counter and prescription medicines only as told by your health care provider.  Try to maintain a normal routine.  Try to anticipate stressful situations and allow extra time to manage them.  Practice any stress management or self-calming techniques as taught by your health care provider.  Do not punish yourself for setbacks or for not making progress.  Try to recognize your accomplishments, even if they are small.  Keep all follow-up visits as told by your health care provider. This is important. Contact a health  care provider if:  Your symptoms do not get better.  Your symptoms get worse.  You have signs of depression, such as: ? A persistently sad, cranky,  or irritable mood. ? Loss of enjoyment in activities that used to bring you joy. ? Change in weight or eating. ? Changes in sleeping habits. ? Avoiding friends or family members. ? Loss of energy for normal tasks. ? Feelings of guilt or worthlessness. Get help right away if:  You have serious thoughts about hurting yourself or others. If you ever feel like you may hurt yourself or others, or have thoughts about taking your own life, get help right away. You can go to your nearest emergency department or call:  Your local emergency services (911 in the [place]  A suicide crisis helpline, such as the National Suicide Prevention Lifeline at 585-315-3745. This is open 24 hours a day. Summary  Generalized anxiety disorder (GAD) is a mental health disorder that involves worry that is not triggered by a specific event.  People with GAD often worry excessively about many things in their lives, such as their health and family.  GAD may cause physical symptoms such as restlessness, trouble concentrating, sleep problems, frequent sweating, nausea, diarrhea, headaches, and trembling or muscle twitching.  A mental health specialist can help determine which treatment is best for you. Some people see improvement with one type of therapy. However, other people require a combination of therapies. This information is not intended to replace advice given to you by your health care provider. Make sure you discuss any questions you have with your health care provider. Document Revised: 11/22/2017 Document Reviewed: 10/30/2016 Elsevier Patient Education  2020 [employer].

## 2020-12-07 NOTE — Progress Notes (Signed)
Established Patient Office Visit  Subjective:  Patient ID: Veronica Waters, female    DOB: [birth year]  Age: 29 y.o. MRN: [id number]  CC:  Chief Complaint  Patient presents with   Medical Management of Chronic Issues    HPI2 Veronica Waters presents for Anxiety: Patient complains of anxiety disorder.  She has the following symptoms: difficulty concentrating. Onset of symptoms was approximately a few months ago, unchanged since that time. She denies current suicidal and homicidal ideation. Family history significant for no psychiatric illness.Possible organic causes contributing are: none. Risk factors: bipolar disorder Previous treatment includes patient is not currently taking medication.  She complains of the following side effects from the treatment: none.  The patient presents with history of type 2 diabetes mellitus without complications. Patient was diagnosed in few years agao. Compliance with treatment has been good; the patient takes medication as directed. Blood sugars are well controlled patient is no longer taking medication. Patient  maintains a diabetic diet and an exercise regimen , follows up as directed , and is no longer  keeping a glucose diary. Patient specifically denies associated symptoms, including blurred vision, fatigue, polydipsia, polyphagia and polyuria . Patient denies hypoglycemia.   Acute intractable tension-type headache: Concerning patient's acute intractable tension type headache, patient is reporting therapeutic effect with propranolol 20 mg tablet by mouth twice daily and not take 75 mg tablet by mouth once as needed.  Past Medical History:  Diagnosis Date   Asthma    Hypertension    Major depressive disorder     Past Surgical History:  Procedure Laterality Date   kidney stone extraction  06/2019    Family History  Problem Relation Age of Onset   Alcohol abuse Father     Social History   Socioeconomic History   Marital status: Divorced     Spouse name: Not on file   Number of children: Not on file   Years of education: 12   Highest education level: 12th grade  Occupational History   Occupation: cna  Tobacco Use   Smoking status: Former Smoker   Smokeless tobacco: Never Used  Building services engineer Use: Never used  Substance and Sexual Activity   Alcohol use: Not Currently   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   Sexual activity: Not Currently    Birth control/protection: Other-see comments    Comment: Rod in left arm for birth control  Other Topics Concern   Not on file  Social History Narrative   Not on file   Social Determinants of Health   Financial Resource Strain: Not on file  Food Insecurity: Not on file  Transportation Needs: Not on file  Physical Activity: Not on file  Stress: Not on file  Social Connections: Not on file  Intimate Partner Violence: Not on file    Outpatient Medications Prior to Visit  Medication Sig Dispense Refill   albuterol (PROVENTIL HFA;VENTOLIN HFA) 108 (90 Base) MCG/ACT inhaler Inhale 1-2 puffs into the lungs every 6 (six) hours as needed for wheezing or shortness of breath. 1 Inhaler 0   Ascorbic Acid (VITAMIN C) 1000 MG tablet Take 2,000 mg by mouth daily.     Cholecalciferol (VITAMIN D) 50 MCG (2000 UT) CAPS Take by mouth.     fluticasone (FLONASE) 50 MCG/ACT nasal spray Place 2 sprays into both nostrils daily. 16 g 6   Multiple Vitamins-Minerals (MULTIVITAMIN WOMEN PO) Take by mouth.     propranolol (INDERAL)  20 MG tablet Take 1 tablet (20 mg total) by mouth 2 (two) times daily. 180 tablet 0   Rimegepant Sulfate (NURTEC) 75 MG TBDP Take 75 mg by mouth once as needed for up to 1 dose. 30 tablet 0   Zinc-Vitamin C (ZINC-A-COLD/VITAMIN C MT) Use as directed in the mouth or throat.     furosemide (LASIX) 20 MG tablet Take 1 tablet (20 mg total) by mouth daily for 3 days. 3 tablet    No facility-administered medications prior to visit.     Allergies  Allergen Reactions   Aspirin Hives   Cranberry Hives and Swelling   Cranberry Extract Anaphylaxis   Hydrocodone-Acetaminophen Hives   Hydrocodone Hives   Trazodone And Nefazodone Other (See Comments)    hallucinations    Vicodin [Hydrocodone-Acetaminophen] Hives    ROS Review of Systems  Respiratory: Negative.   Cardiovascular: Negative.   Musculoskeletal: Negative.   Skin: Negative.   Psychiatric/Behavioral: Negative for agitation, behavioral problems, self-injury, sleep disturbance and suicidal ideas. The patient is nervous/anxious.   All other systems reviewed and are negative.     Objective:    Physical Exam Vitals reviewed.  Constitutional:      General: She is awake.     Appearance: She is well-groomed. She is obese.     Interventions: Face mask in place.  HENT:     Head: Normocephalic.     Nose: Nose normal.  Eyes:     Conjunctiva/sclera: Conjunctivae normal.  Cardiovascular:     Rate and Rhythm: Normal rate and regular rhythm.     Pulses: Normal pulses.  Pulmonary:     Effort: Pulmonary effort is normal.     Breath sounds: Normal breath sounds.  Abdominal:     General: Bowel sounds are normal.  Musculoskeletal:        General: No tenderness.  Skin:    General: Skin is warm and dry.  Neurological:     Mental Status: She is alert and oriented to person, place, and time.  Psychiatric:        Behavior: Behavior is cooperative.     Comments: Positive anxiety     BP 129/78    Pulse 90    Temp 98.1 F (36.7 C)    Resp 20    Ht 5\' 7"  (1.702 m)    Wt 284 lb (128.8 kg)    LMP 11/16/2020    SpO2 97%    BMI 44.48 kg/m  Wt Readings from Last 3 Encounters:  12/07/20 284 lb (128.8 kg)  09/05/20 259 lb (117.5 kg)  07/16/18 (!) 309 lb 4.8 oz (140.3 kg)     Health Maintenance Due  Topic Date Due   PNEUMOCOCCAL POLYSACCHARIDE VACCINE AGE 59-64 HIGH RISK  Never done   FOOT EXAM  Never done   OPHTHALMOLOGY EXAM  Never done   URINE  MICROALBUMIN  Never done   PAP-Cervical Cytology Screening  Never done   PAP SMEAR-Modifier  Never done   HEMOGLOBIN A1C  12/31/2018    There are no preventive care reminders to display for this patient.  Lab Results  Component Value Date   TSH 1.730 07/14/2018   Lab Results  Component Value Date   WBC 7.5 12/07/2020   HGB 13.1 12/07/2020   HCT 39.6 12/07/2020   MCV 83 12/07/2020   PLT 346 12/07/2020   Lab Results  Component Value Date   NA 141 12/07/2020   K 4.4 12/07/2020   CO2 24 12/07/2020  GLUCOSE 77 12/07/2020   BUN 11 12/07/2020   CREATININE 0.63 12/07/2020   BILITOT 0.4 12/07/2020   ALKPHOS 94 12/07/2020   AST 12 12/07/2020   ALT 19 12/07/2020   PROT 7.4 12/07/2020   ALBUMIN 4.2 12/07/2020   CALCIUM 9.0 12/07/2020   ANIONGAP 10 07/17/2018   Lab Results  Component Value Date   CHOL 222 (H) 12/07/2020   Lab Results  Component Value Date   HDL 46 12/07/2020   Lab Results  Component Value Date   LDLCALC 148 (H) 12/07/2020   Lab Results  Component Value Date   TRIG 156 (H) 12/07/2020   Lab Results  Component Value Date   CHOLHDL 4.8 (H) 12/07/2020   Lab Results  Component Value Date   HGBA1C 5.4 06/30/2018   Flowsheet Row Office Visit from 09/05/2020 in [facility]  PHQ-9 Total Score 2        GAD 7 : Generalized Anxiety Score 12/07/2020 09/05/2020  Nervous, Anxious, on Edge 2 0  Control/stop worrying 1 0  Worry too much - different things 0 0  Trouble relaxing 2 0  Restless 2 0  Easily annoyed or irritable 1 3  Afraid - awful might happen 0 0  Total GAD 7 Score 8 3  Anxiety Difficulty Somewhat difficult Not difficult at all    Assessment & Plan:   Problem List Items Addressed This Visit      Endocrine   Diabetes mellitus type 2 in obese [facility]) - Primary    Patient is well controlled and no longer has to use Metformin.      Relevant Orders   CBC with Differential (Completed)   Comprehensive metabolic  panel (Completed)   Lipid Panel (Completed)     Other   Acute intractable tension-type headache    Patient well managed on propranolol 20 mg tablet by mouth twice daily and not take 75 mg tablet by mouth as needed.      Relevant Medications   Rimegepant Sulfate (NURTEC) 75 MG TBDP   Anxiety    Started patient on hydroxyzine 10 mg tablet by mouth as needed.      Relevant Medications   hydrOXYzine (ATARAX/VISTARIL) 10 MG tablet      Meds ordered this encounter  Medications   hydrOXYzine (ATARAX/VISTARIL) 10 MG tablet    Sig: Take 1 tablet (10 mg total) by mouth 3 (three) times daily as needed.    Dispense:  60 tablet    Refill:  0    Order Specific Question:   Supervising Provider    Answer:   Arville Care A [[id number]]   Rimegepant Sulfate (NURTEC) 75 MG TBDP    Sig: Take 75 mg by mouth once as needed for up to 1 dose.    Dispense:  30 tablet    Refill:  0    Order Specific Question:   Supervising Provider    Answer:   Arville Care A [[id number]]    Follow-up: Return in about 3 months (around 03/07/2021).    Daryll Drown, NP

## 2020-12-08 LAB — CBC WITH DIFFERENTIAL/PLATELET
Basophils Absolute: 0.1 10*3/uL (ref 0.0–0.2)
Basos: 1 %
EOS (ABSOLUTE): 0.2 10*3/uL (ref 0.0–0.4)
Eos: 2 %
Hematocrit: 39.6 % (ref 34.0–46.6)
Hemoglobin: 13.1 g/dL (ref 11.1–15.9)
Immature Grans (Abs): 0 10*3/uL (ref 0.0–0.1)
Immature Granulocytes: 0 %
Lymphocytes Absolute: 1.7 10*3/uL (ref 0.7–3.1)
Lymphs: 22 %
MCH: 27.3 pg (ref 26.6–33.0)
MCHC: 33.1 g/dL (ref 31.5–35.7)
MCV: 83 fL (ref 79–97)
Monocytes Absolute: 0.5 10*3/uL (ref 0.1–0.9)
Monocytes: 6 %
Neutrophils Absolute: 5.1 10*3/uL (ref 1.4–7.0)
Neutrophils: 69 %
Platelets: 346 10*3/uL (ref 150–450)
RBC: 4.8 x10E6/uL (ref 3.77–5.28)
RDW: 12.7 % (ref 11.7–15.4)
WBC: 7.5 10*3/uL (ref 3.4–10.8)

## 2020-12-08 LAB — COMPREHENSIVE METABOLIC PANEL
ALT: 19 IU/L (ref 0–32)
AST: 12 IU/L (ref 0–40)
Albumin/Globulin Ratio: 1.3 (ref 1.2–2.2)
Albumin: 4.2 g/dL (ref 3.9–5.0)
Alkaline Phosphatase: 94 IU/L (ref 44–121)
BUN/Creatinine Ratio: 17 (ref 9–23)
BUN: 11 mg/dL (ref 6–20)
Bilirubin Total: 0.4 mg/dL (ref 0.0–1.2)
CO2: 24 mmol/L (ref 20–29)
Calcium: 9 mg/dL (ref 8.7–10.2)
Chloride: 104 mmol/L (ref 96–106)
Creatinine, Ser: 0.63 mg/dL (ref 0.57–1.00)
GFR calc Af Amer: 141 mL/min/{1.73_m2} (ref >59)
GFR calc non Af Amer: 122 mL/min/{1.73_m2} (ref >59)
Globulin, Total: 3.2 g/dL (ref 1.5–4.5)
Glucose: 77 mg/dL (ref 65–99)
Potassium: 4.4 mmol/L (ref 3.5–5.2)
Sodium: 141 mmol/L (ref 134–144)
Total Protein: 7.4 g/dL (ref 6.0–8.5)

## 2020-12-08 LAB — LIPID PANEL
Chol/HDL Ratio: 4.8 ratio — ABNORMAL HIGH (ref 0.0–4.4)
Cholesterol, Total: 222 mg/dL — ABNORMAL HIGH (ref 100–199)
HDL: 46 mg/dL (ref >39)
LDL Chol Calc (NIH): 148 mg/dL — ABNORMAL HIGH (ref 0–99)
Triglycerides: 156 mg/dL — ABNORMAL HIGH (ref 0–149)
VLDL Cholesterol Cal: 28 mg/dL (ref 5–40)

## 2020-12-08 NOTE — Assessment & Plan Note (Signed)
Patient is well controlled and no longer has to use Metformin.

## 2020-12-08 NOTE — Assessment & Plan Note (Signed)
Patient well managed on propranolol 20 mg tablet by mouth twice daily and not take 75 mg tablet by mouth as needed.

## 2020-12-08 NOTE — Assessment & Plan Note (Signed)
Started patient on hydroxyzine 10 mg tablet by mouth as needed.

## 2020-12-09 ENCOUNTER — Telehealth: Payer: Self-pay | Admitting: *Deleted

## 2020-12-09 ENCOUNTER — Ambulatory Visit: Payer: PRIVATE HEALTH INSURANCE | Admitting: Nurse Practitioner

## 2020-12-09 DIAGNOSIS — G44201 Tension-type headache, unspecified, intractable: Secondary | ICD-10-CM

## 2020-12-09 NOTE — Telephone Encounter (Signed)
PA REQUEST CAME IN ON PAPER FAX From [facility]  [facility] ID# [id number]  Form filled out and faxed - will place in PA box and wait for paper response.

## 2020-12-14 ENCOUNTER — Ambulatory Visit
Admission: RE | Admit: 2020-12-14 | Discharge: 2020-12-14 | Disposition: A | Discharge status: Home / Self Care | Payer: PRIVATE HEALTH INSURANCE | Source: Ambulatory Visit | Attending: Emergency Medicine | Admitting: Emergency Medicine

## 2020-12-14 ENCOUNTER — Other Ambulatory Visit: Payer: Self-pay

## 2020-12-14 VITALS — BP 117/83 | HR 91 | Temp 98.0°F | Resp 18

## 2020-12-14 DIAGNOSIS — G8929 Other chronic pain: Secondary | ICD-10-CM | POA: Diagnosis not present

## 2020-12-14 DIAGNOSIS — M25511 Pain in right shoulder: Secondary | ICD-10-CM | POA: Diagnosis not present

## 2020-12-14 MED ORDER — PREDNISONE 10 MG (21) PO TBPK: ORAL_TABLET | Freq: Every day | ORAL | 0 refills | Status: DC

## 2020-12-14 NOTE — ED Triage Notes (Signed)
Pt presents with right shoulder pain . Pt states has been hurting for few months. 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 Pain has gotten worse recently

## 2020-12-14 NOTE — Discharge Instructions (Addendum)
Prednisone was prescribed/take as directed Take OTC Tylenol 500 mg as needed for pain Follow RICE instruction that is attached Follow-up with PCP Return or go to ED if you develop any new or worsening of his symptoms

## 2020-12-14 NOTE — ED Provider Notes (Signed)
[facility]   [id number] 12/14/20 Arrival Time: [phone]   Chief Complaint  Patient presents with  . Shoulder Pain     SUBJECTIVE: History from: patient.  Veronica Waters is a 29 y.o. female presented to the urgent care with a complaint of acute on chronic right shoulder pain for the past 2 days.  Denies trauma or injury.  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  She localizes the pain to the right shoulder.  She describes the pain as constant and achy.  She has tried OTC medications without relief.  Her symptoms are made worse with ROM.  She denies similar symptoms in the past.  Denies chills, fever, nausea, vomiting, diarrhea  ROS: As per HPI.  All other pertinent ROS negative.      Past Medical History:  Diagnosis Date  . Asthma   . Hypertension   . Major depressive disorder    Past Surgical History:  Procedure Laterality Date  . kidney stone extraction  06/2019   Allergies  Allergen Reactions  . Aspirin Hives  . Cranberry Hives and Swelling  . Cranberry Extract Anaphylaxis  . Hydrocodone-Acetaminophen Hives  . Hydrocodone Hives  . Trazodone And Nefazodone Other (See Comments)    hallucinations   . Vicodin [Hydrocodone-Acetaminophen] Hives   No current facility-administered medications on file prior to encounter.   Current Outpatient Medications on File Prior to Encounter  Medication Sig Dispense Refill  . albuterol (PROVENTIL HFA;VENTOLIN HFA) 108 (90 Base) MCG/ACT inhaler Inhale 1-2 puffs into the lungs every 6 (six) hours as needed for wheezing or shortness of breath. 1 Inhaler 0  . Ascorbic Acid (VITAMIN C) 1000 MG tablet Take 2,000 mg by mouth daily.    . Cholecalciferol (VITAMIN D) 50 MCG (2000 UT) CAPS Take by mouth.    . fluticasone (FLONASE) 50 MCG/ACT nasal spray Place 2 sprays into both nostrils daily. 16 g 6  . hydrOXYzine (ATARAX/VISTARIL) 10 MG tablet Take 1 tablet (10 mg total) by mouth 3 (three) times  daily as needed. 60 tablet 0  . Multiple Vitamins-Minerals (MULTIVITAMIN WOMEN PO) Take by mouth.    . propranolol (INDERAL) 20 MG tablet Take 1 tablet (20 mg total) by mouth 2 (two) times daily. 180 tablet 0  . Rimegepant Sulfate (NURTEC) 75 MG TBDP Take 75 mg by mouth once as needed for up to 1 dose. 30 tablet 0  . Zinc-Vitamin C (ZINC-A-COLD/VITAMIN C MT) Use as directed in the mouth or throat.     Social History   Socioeconomic History  . Marital status: Divorced    Spouse name: Not on file  . Number of children: Not on file  . Years of education: 74  . Highest education level: 12th grade  Occupational History  . Occupation: cna  Tobacco Use  . Smoking status: Former Games developer  . Smokeless tobacco: Never Used  Vaping Use  . Vaping Use: Never used  Substance and Sexual Activity  . Alcohol use: Not Currently  . 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 Sexual activity: Not Currently    Birth control/protection: Other-see comments    Comment: Rod in left arm for birth control  Other Topics Concern  . Not on file  Social History Narrative  . Not on file   Social Determinants of Health   Financial Resource Strain: Not on file  Food Insecurity: Not on file  Transportation Needs: Not on file  Physical Activity: Not on file  Stress: Not on file  Social Connections: Not on file  Intimate Partner Violence: Not on file   Family History  Problem Relation Age of Onset  . Alcohol abuse Father     OBJECTIVE:  Vitals:   12/14/20 0918  BP: 117/83  Pulse: 91  Resp: 18  Temp: 98 F (36.7 C)  SpO2: 98%     Physical Exam Vitals and nursing note reviewed.  Constitutional:      General: She is not in acute distress.    Appearance: Normal appearance. She is normal weight. She is not ill-appearing, toxic-appearing or diaphoretic.  HENT:     Head: Normocephalic.  Cardiovascular:     Rate and Rhythm: Normal rate and regular rhythm.     Pulses: Normal pulses.      Heart sounds: Normal heart sounds. No murmur heard. No friction rub. No gallop.   Pulmonary:     Effort: Pulmonary effort is normal. No respiratory distress.     Breath sounds: Normal breath sounds. No stridor. No wheezing, rhonchi or rales.  Chest:     Chest wall: No tenderness.  Musculoskeletal:        General: Tenderness present. No signs of injury.     Right shoulder: Tenderness present.     Left shoulder: Normal.     Comments: The right shoulder is without any obvious asymmetry or deformity when compared to the left shoulder.  There is no ecchymosis, open wound, lesion, swelling or warmth present.  Limited range of motion due to pain.  Neurovascular status intact.  Neurological:     Mental Status: She is alert and oriented to person, place, and time.     LABS:  No results found for this or any previous visit (from the past 24 hour(s)).   ASSESSMENT & PLAN:  1. Chronic right shoulder pain     Meds ordered this encounter  Medications  . predniSONE (STERAPRED UNI-PAK 21 TAB) 10 MG (21) TBPK tablet    Sig: Take by mouth daily. Take 6 tabs by mouth daily  for 1 days, then 5 tabs for 1 days, then 4 tabs for 1 days, then 3 tabs for 1 days, 2 tabs for 1 days, then 1 tab by mouth daily for 1 days    Dispense:  21 tablet    Refill:  0   Patient is stable at discharge.  Symptom is likely from tendinitis.  Prednisone was prescribed.  She was advised to follow up with PCP   Discharge instructions  Prednisone was prescribed/take as directed Take OTC Tylenol 500 mg as needed for pain Follow RICE instruction that is attached Follow-up with PCP Return or go to ED if you develop any new or worsening of his symptoms    Reviewed expectations re: course of current medical issues. Questions answered. Outlined signs and symptoms indicating need for more acute intervention. Patient verbalized understanding. After Visit Summary given.         Durward Parcel, FNP 12/14/20  [phone]

## 2020-12-22 NOTE — Telephone Encounter (Signed)
Nurtec was denied.

## 2020-12-22 NOTE — Telephone Encounter (Signed)
We can find her a different medication for Migraine that her insurance will approve, I will consult with Raynelle Fanning to see what she has available.

## 2020-12-24 ENCOUNTER — Telehealth: Payer: PRIVATE HEALTH INSURANCE | Admitting: Nurse Practitioner

## 2020-12-24 DIAGNOSIS — H6505 Acute serous otitis media, recurrent, left ear: Secondary | ICD-10-CM

## 2020-12-24 MED ORDER — AMOXICILLIN-POT CLAVULANATE 875-125 MG PO TABS: 1.0000 | ORAL_TABLET | Freq: Two times a day (BID) | ORAL | 0 refills | Status: DC

## 2020-12-24 NOTE — Progress Notes (Signed)
E Visit for Ear infestion  We are sorry that you are not feeling well. Here is how we plan to help!   Based on what you have told me you may have a bacterial infectio. I have prescribed an oral antibiotic: and Augmentin 625mg  one tablet by mouth twice a day for 10 days  In certain cases swimmer's ear may progress to a more serious bacterial infection of the middle or inner ear.  If you have a fever 102 and up and significantly worsening symptoms, this could indicate a more serious infection moving to the middle/inner and needs face to face evaluation in an office by a provider.  Your symptoms should improve over the next 3 days and should resolve in about 7 days.  HOME CARE:   Wash your hands frequently.  Do not place the tip of the bottle on your ear or touch it with your fingers.  You can take Acetominophen 650 mg every 4-6 hours as needed for pain.  If pain is severe or moderate, you can apply a heating pad (set on low) or hot water bottle (wrapped in a towel) to outer ear for 20 minutes.  This will also increase drainage.  Avoid ear plugs  Do not use Q-tips  After showers, help the water run out by tilting your head to one side.  GET HELP RIGHT AWAY IF:   Fever is over 102.2 degrees.  You develop progressive ear pain or hearing loss.  Ear symptoms persist longer than 3 days after treatment.  MAKE SURE YOU:   Understand these instructions.  Will watch your condition.  Will get help right away if you are not doing well or get worse.   Thank you for choosing an e-visit. Your e-visit answers were reviewed by a board certified advanced clinical practitioner to complete your personal care plan. Depending upon the condition, your plan could have included both over the counter or prescription medications. Please review your pharmacy choice. Be sure that the pharmacy you have chosen is open so that you can pick up your prescription now.  If there is a problem you may message  your provider in MyChart to have the prescription routed to another pharmacy. Your safety is important to . If you have drug allergies check your prescription carefully.  For the next 24 hours, you can use MyChart to ask questions about today's visit, request a non-urgent call back, or ask for a work or school excuse from your e-visit provider. You will get an email in the next two days asking about your experience. I hope that your e-visit has been valuable and will speed your recovery.   5-10 minutes spent reviewing and documenting in chart.

## 2020-12-28 ENCOUNTER — Ambulatory Visit: Payer: PRIVATE HEALTH INSURANCE | Admitting: Nurse Practitioner

## 2020-12-30 ENCOUNTER — Other Ambulatory Visit: Payer: Self-pay | Admitting: *Deleted

## 2020-12-30 MED ORDER — PROPRANOLOL HCL 20 MG PO TABS: 20.0000 mg | ORAL_TABLET | Freq: Two times a day (BID) | ORAL | 0 refills | Status: DC

## 2021-01-05 MED ORDER — NURTEC 75 MG PO TBDP: ORAL_TABLET | ORAL | 3 refills | Status: DC

## 2021-01-05 NOTE — Telephone Encounter (Signed)
She will likely have to try and fail and triptan for abortive therapy (unless contraindication) Has she tried anything in the past for abortive? It appears she is on propranolol potentially for migraine prophlyaxis?   I did not work on original PA, so i'm not sure why they denied?  I will resubmit RX to [facility] to work on PA (this is a [facility] officer order pharmacy that deals with Nurtec specifically)

## 2021-01-16 ENCOUNTER — Telehealth: Payer: Self-pay

## 2021-01-16 DIAGNOSIS — G44201 Tension-type headache, unspecified, intractable: Secondary | ICD-10-CM

## 2021-01-16 NOTE — Telephone Encounter (Signed)
Pharmacy will fax form to fill out.

## 2021-01-19 NOTE — Telephone Encounter (Signed)
Form received late 01/18/21 - form filed out this morning and sent to provider to sign. Once back - we will fax

## 2021-02-06 NOTE — Telephone Encounter (Signed)
Faxed back to plan 01/20/21 - still waiting for response (02/06/21)

## 2021-02-20 ENCOUNTER — Ambulatory Visit: Payer: Self-pay

## 2021-02-22 NOTE — Telephone Encounter (Signed)
PA denied.

## 2021-03-01 ENCOUNTER — Encounter: Payer: Self-pay | Admitting: Family Medicine

## 2021-03-01 ENCOUNTER — Other Ambulatory Visit: Payer: Self-pay

## 2021-03-01 ENCOUNTER — Ambulatory Visit: Payer: PRIVATE HEALTH INSURANCE | Admitting: Family Medicine

## 2021-03-01 VITALS — BP 128/88 | HR 73 | Temp 96.9°F | Ht 67.0 in | Wt 295.0 lb

## 2021-03-01 DIAGNOSIS — G5601 Carpal tunnel syndrome, right upper limb: Secondary | ICD-10-CM

## 2021-03-01 NOTE — Patient Instructions (Signed)
Carpal Tunnel Syndrome  Carpal tunnel syndrome is a condition that causes pain, weakness, and numbness in your hand and arm. Numbness is when you cannot feel an area in your body. The carpal tunnel is a narrow area that is on the palm side of your wrist. Repeated wrist motion or certain diseases may cause swelling in the tunnel. This swelling can pinch the main nerve in the wrist. This nerve is called the median nerve. What are the causes? This condition may be caused by:  Moving your hand and wrist over and over again while doing a task.  Injury to the wrist.  Arthritis.  A sac of fluid (cyst) or abnormal growth (tumor) in the carpal tunnel.  Fluid buildup during pregnancy.  Use of tools that vibrate. Sometimes the cause is not known. What increases the risk? The following factors may make you more likely to have this condition:  Having a job that makes you do these things: ? Move your hand over and over again. ? Work with tools that vibrate, such as drills or sanders.  Being a woman.  Having diabetes, obesity, thyroid problems, or kidney failure. What are the signs or symptoms? Symptoms of this condition include:  A tingling feeling in your fingers.  Tingling or loss of feeling in your hand.  Pain in your entire arm. This pain may get worse when you bend your wrist and elbow for a long time.  Pain in your wrist that goes up your arm to your shoulder.  Pain that goes down into your palm or fingers.  Weakness in your hands. You may find it hard to grab and hold items. You may feel worse at night. How is this treated? This condition may be treated with:  Lifestyle changes. You will be asked to stop or change the activity that caused your problem.  Doing exercises and activities that make bones, muscles, and tendons stronger (physical therapy).  Learning how to use your hand again (occupational therapy).  Medicines for pain and swelling. You may have injections in  your wrist.  A wrist splint or brace.  Surgery. Follow these instructions at home: If you have a splint or brace:  Wear the splint or brace as told by your doctor. Take it off only as told by your doctor.  Loosen the splint if your fingers: ? Tingle. ? Become numb. ? Turn cold and blue.  Keep the splint or brace clean.  If the splint or brace is not waterproof: ? Do not let it get wet. ? Cover it with a watertight covering when you take a bath or a shower. Managing pain, stiffness, and swelling If told, put ice on the painful area:  If you have a removable splint or brace, remove it as told by your doctor.  Put ice in a plastic bag.  Place a towel between your skin and the bag.  Leave the ice on for 20 minutes, 2-3 times per day. Do not fall asleep with the cold pack on your skin.  Take off the ice if your skin turns bright red. This is very important. If you cannot feel pain, heat, or cold, you have a greater risk of damage to the area. Move your fingers often to reduce stiffness and swelling.   General instructions  Take over-the-counter and prescription medicines only as told by your doctor.  Rest your wrist from any activity that may cause pain. If needed, talk with your boss at work about changes that can   help your wrist heal.  Do exercises as told by your doctor, physical therapist, or occupational therapist.  Keep all follow-up visits. Contact a doctor if:  You have new symptoms.  Medicine does not help your pain.  Your symptoms get worse. Get help right away if:  You have very bad numbness or tingling in your wrist or hand. Summary  Carpal tunnel syndrome is a condition that causes pain in your hand and arm.  It is often caused by repeated wrist motions.  Lifestyle changes and medicines are used to treat this problem. Surgery may help in very bad cases.  Follow your doctor's instructions about wearing a splint, resting your wrist, keeping follow-up  visits, and calling for help. This information is not intended to replace advice given to you by your health care provider. Make sure you discuss any questions you have with your health care provider. Document Revised: 04/21/2020 Document Reviewed: 04/21/2020 Elsevier Patient Education  2021 [employer]  

## 2021-03-01 NOTE — Progress Notes (Signed)
Assessment & Plan:  1. Carpal tunnel syndrome on right Education provided on carpal tunnel.  Encourage patient to wear a brace.  Tylenol/ibuprofen for pain. - Ambulatory referral to Orthopedic Surgery   Follow up plan: Return if symptoms worsen or fail to improve.  Deliah Boston, MSN, APRN, FNP-C [facility]  Subjective:   Patient ID: Veronica Waters, female    DOB: 04-18-1991, 30 y.o.   MRN: [id number]  HPI: Veronica Waters is a 30 y.o. female presenting on 03/01/2021 for Carpal Tunnel (Right. Went to [facility] on 2/28 and states no better- needs referral )  Patient went to urgent care 9 days ago due to right hand pain that started two days prior. She was diagnosed with carpal tunnel and treated with Prednisone 80 mg daily x7 days. She reports today there has been no improvement in symptoms. 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 She is having more numbness that occurs in her thumb and first two fingers. She has been wearing a brace at night which is helpful during the night but is unable to wear the brace during the day at work.    ROS: Negative unless specifically indicated above in HPI.   Relevant past medical history reviewed and updated as indicated.   Allergies and medications reviewed and updated.   Current Outpatient Medications:  .  albuterol (PROVENTIL HFA;VENTOLIN HFA) 108 (90 Base) MCG/ACT inhaler, Inhale 1-2 puffs into the lungs every 6 (six) hours as needed for wheezing or shortness of breath., Disp: 1 Inhaler, Rfl: 0 .  Ascorbic Acid (VITAMIN C) 1000 MG tablet, Take 2,000 mg by mouth daily., Disp: , Rfl:  .  Cholecalciferol (VITAMIN D) 50 MCG (2000 UT) CAPS, Take by mouth., Disp: , Rfl:  .  Multiple Vitamins-Minerals (MULTIVITAMIN WOMEN PO), Take by mouth., Disp: , Rfl:  .  propranolol (INDERAL) 20 MG tablet, Take 1 tablet (20 mg total) by mouth 2 (two) times daily., Disp: 180 tablet, Rfl: 0 .  Rimegepant Sulfate  (NURTEC) 75 MG TBDP, Take 1 tablet (75 mg) orally once per 24 hours placed on or under the tongue., Disp: 8 tablet, Rfl: 3 .  Zinc-Vitamin C (ZINC-A-COLD/VITAMIN C MT), Use as directed in the mouth or throat., Disp: , Rfl:   Allergies  Allergen Reactions  . Aspirin Hives  . Cranberry Hives and Swelling  . Cranberry Extract Anaphylaxis  . Hydrocodone-Acetaminophen Hives  . Hydrocodone Hives  . Trazodone And Nefazodone Other (See Comments)    hallucinations   . Vicodin [Hydrocodone-Acetaminophen] Hives    Objective:   BP 128/88   Pulse 73   Temp (!) 96.9 F (36.1 C) (Temporal)   Ht 5\' 7"  (1.702 m)   Wt 295 lb (133.8 kg)   SpO2 99%   BMI 46.20 kg/m    Physical Exam Vitals reviewed.  Constitutional:      General: She is not in acute distress.    Appearance: Normal appearance. She is not ill-appearing, toxic-appearing or diaphoretic.  HENT:     Head: Normocephalic and atraumatic.  Eyes:     General: No scleral icterus.       Right eye: No discharge.        Left eye: No discharge.     Conjunctiva/sclera: Conjunctivae normal.  Cardiovascular:     Rate and Rhythm: Normal rate.  Pulmonary:     Effort: Pulmonary effort is normal. No respiratory distress.  Musculoskeletal:  General: Normal range of motion.     Cervical back: Normal range of motion.     Comments: Positive Tinel's sign.   Skin:    General: Skin is warm and dry.     Capillary Refill: Capillary refill takes less than 2 seconds.  Neurological:     General: No focal deficit present.     Mental Status: She is alert and oriented to person, place, and time. Mental status is at baseline.  Psychiatric:        Mood and Affect: Mood normal.        Behavior: Behavior normal.        Thought Content: Thought content normal.        Judgment: Judgment normal.

## 2021-03-07 ENCOUNTER — Ambulatory Visit: Payer: PRIVATE HEALTH INSURANCE | Admitting: Nurse Practitioner

## 2021-03-08 ENCOUNTER — Ambulatory Visit: Payer: PRIVATE HEALTH INSURANCE | Admitting: Nurse Practitioner

## 2021-03-10 ENCOUNTER — Telehealth: Payer: PRIVATE HEALTH INSURANCE | Admitting: Nurse Practitioner

## 2021-03-10 DIAGNOSIS — H6690 Otitis media, unspecified, unspecified ear: Secondary | ICD-10-CM

## 2021-03-10 MED ORDER — AMOXICILLIN-POT CLAVULANATE 875-125 MG PO TABS: 1.0000 | ORAL_TABLET | Freq: Two times a day (BID) | ORAL | 0 refills | Status: DC

## 2021-03-10 NOTE — Progress Notes (Signed)
E Visit for Swimmer's Ear * Contacted patient by phon to verify exactly what was going on with her.  We are sorry that you are not feeling well. Here is how we plan to help!  It sounds like otis media  Augmentin 625mg  one tablet by mouth twice a day for 10 days  In certain cases otitis externa may progress to a more serious bacterial infection of the middle or inner ear.  If you have a fever 102 and up and significantly worsening symptoms, this could indicate a more serious infection moving to the middle/inner and needs face to face evaluation in an office by a provider.  Your symptoms should improve over the next 3 days and should resolve in about 7 days.  HOME CARE:   Wash your hands frequently.  Do not place the tip of the bottle on your ear or touch it with your fingers.  You can take Acetominophen 650 mg every 4-6 hours as needed for pain.  If pain is severe or moderate, you can apply a heating pad (set on low) or hot water bottle (wrapped in a towel) to outer ear for 20 minutes.  This will also increase drainage.  Avoid ear plugs  Do not use Q-tips  After showers, help the water run out by tilting your head to one side.  GET HELP RIGHT AWAY IF:   Fever is over 102.2 degrees.  You develop progressive ear pain or hearing loss.  Ear symptoms persist longer than 3 days after treatment.  MAKE SURE YOU:   Understand these instructions.  Will watch your condition.  Will get help right away if you are not doing well or get worse.  TO PREVENT SWIMMER'S EAR:  Use a bathing cap or custom fitted swim molds to keep your ears dry.  Towel off after swimming to dry your ears.  Tilt your head or pull your earlobes to allow the water to escape your ear canal.  If there is still water in your ears, consider using a hairdryer on the lowest setting.  Thank you for choosing an e-visit. Your e-visit answers were reviewed by a board certified advanced clinical practitioner to  complete your personal care plan. Depending upon the condition, your plan could have included both over the counter or prescription medications. Please review your pharmacy choice. Be sure that the pharmacy you have chosen is open so that you can pick up your prescription now.  If there is a problem you may message your provider in MyChart to have the prescription routed to another pharmacy. Your safety is important to . If you have drug allergies check your prescription carefully.  For the next 24 hours, you can use MyChart to ask questions about today's visit, request a non-urgent call back, or ask for a work or school excuse from your e-visit provider. You will get an email in the next two days asking about your experience. I hope that your e-visit has been valuable and will speed your recovery.   5-10 minutes spent reviewing and documenting in chart.

## 2021-03-30 ENCOUNTER — Telehealth: Payer: PRIVATE HEALTH INSURANCE | Admitting: Orthopedic Surgery

## 2021-03-30 DIAGNOSIS — M545 Low back pain, unspecified: Secondary | ICD-10-CM | POA: Diagnosis not present

## 2021-03-30 MED ORDER — NAPROXEN 500 MG PO TABS: 500.0000 mg | ORAL_TABLET | Freq: Two times a day (BID) | ORAL | 0 refills | Status: DC

## 2021-03-30 MED ORDER — TIZANIDINE HCL 2 MG PO TABS: 2.0000 mg | ORAL_TABLET | Freq: Three times a day (TID) | ORAL | 0 refills | Status: DC | PRN

## 2021-03-30 NOTE — Progress Notes (Signed)
We are sorry that you are not feeling well.  Here is how we plan to help!  Based on what you have shared with me it looks like you mostly have acute back pain.  Acute back pain is defined as musculoskeletal pain that can resolve in 1-3 weeks with conservative treatment.  I suspect that with the medications you'll be able to continue to work but if you're not feeling better in a couple of days it may be useful to take some time off.  I have prescribed Naprosyn 500 mg take one by mouth twice a day non-steroid anti-inflammatory (NSAID) as well as Tizanidine 2 mg every eight hours as needed which is a muscle relaxer  Some patients experience stomach irritation or in increased heartburn with anti-inflammatory drugs.  Please keep in mind that muscle relaxer's can cause fatigue and should not be taken while at work or driving.  Back pain is very common.  The pain often gets better over time.  The cause of back pain is usually not dangerous.  Most people can learn to manage their back pain on their own.  Home Care  Stay active.  Start with short walks on flat ground if you can.  Try to walk farther each day.  Do not sit, drive or stand in one place for more than 30 minutes.  Do not stay in bed.  Do not avoid exercise or work.  Activity can help your back heal faster.  Be careful when you bend or lift an object.  Bend at your knees, keep the object close to you, and do not twist.  Sleep on a firm mattress.  Lie on your side, and bend your knees.  If you lie on your back, put a pillow under your knees.  Only take medicines as told by your doctor.  Put ice on the injured area.  Put ice in a plastic bag  Place a towel between your skin and the bag  Leave the ice on for 15-20 minutes, 3-4 times a day for the first 2-3 days. 210 After that, you can switch between ice and heat packs.  Ask your doctor about back exercises or massage.  Avoid feeling anxious or stressed.  Find good ways to deal with  stress, such as exercise.  Get Help Right Way If:  Your pain does not go away with rest or medicine.  Your pain does not go away in 1 week.  You have new problems.  You do not feel well.  The pain spreads into your legs.  You cannot control when you poop (bowel movement) or pee (urinate)  You feel sick to your stomach (nauseous) or throw up (vomit)  You have belly (abdominal) pain.  You feel like you may pass out (faint).  If you develop a fever.  Make Sure you:  Understand these instructions.  Will watch your condition  Will get help right away if you are not doing well or get worse.  Your e-visit answers were reviewed by a board certified advanced clinical practitioner to complete your personal care plan.  Depending on the condition, your plan could have included both over the counter or prescription medications.  If there is a problem please reply  once you have received a response from your provider.  Your safety is important to [place].  If you have drug allergies check your prescription carefully.    You can use MyChart to ask questions about today's visit, request a non-urgent call back, or  ask for a work or school excuse for 24 hours related to this e-Visit. If it has been greater than 24 hours you will need to follow up with your provider, or enter a new e-Visit to address those concerns.  You will get an e-mail in the next two days asking about your experience.  I hope that your e-visit has been valuable and will speed your recovery. Thank you for using e-visits.  Greater than 5 minutes, yet less than 10 minutes of time have been spent researching, coordinating and implementing care for this patient today.

## 2021-04-26 ENCOUNTER — Telehealth: Payer: PRIVATE HEALTH INSURANCE | Admitting: Nurse Practitioner

## 2021-04-26 DIAGNOSIS — R059 Cough, unspecified: Secondary | ICD-10-CM

## 2021-04-26 MED ORDER — PREDNISONE 20 MG PO TABS: 40.0000 mg | ORAL_TABLET | Freq: Every day | ORAL | 0 refills | Status: AC

## 2021-04-26 MED ORDER — BENZONATATE 100 MG PO CAPS: 100.0000 mg | ORAL_CAPSULE | Freq: Three times a day (TID) | ORAL | 0 refills | Status: DC | PRN

## 2021-04-26 MED ORDER — ALBUTEROL SULFATE HFA 108 (90 BASE) MCG/ACT IN AERS: 2.0000 | INHALATION_SPRAY | Freq: Four times a day (QID) | RESPIRATORY_TRACT | 0 refills | Status: AC | PRN

## 2021-04-26 NOTE — Progress Notes (Signed)
We are sorry that you are not feeling well.  Here is how we plan to help!  Based on your presentation I believe you most likely have A cough due to a virus.  This is called viral bronchitis and is best treated by rest, plenty of fluids and control of the cough.  You may use Ibuprofen or Tylenol as directed to help your symptoms.     In addition you may use A prescription cough medication called Tessalon Perles 100mg . You may take 1-2 capsules every 8 hours as needed for your cough.  Prednisone 31m 2 po at sametime daily for 5days and albuterol inhaler 2 puffs q 4 hours prn.  From your responses in the eVisit questionnaire you describe inflammation in the upper respiratory tract which is causing a significant cough.  This is commonly called Bronchitis and has four common causes:    Allergies  Viral Infections  Acid Reflux  Bacterial Infection Allergies, viruses and acid reflux are treated by controlling symptoms or eliminating the cause. An example might be a cough caused by taking certain blood pressure medications. You stop the cough by changing the medication. Another example might be a cough caused by acid reflux. Controlling the reflux helps control the cough.  USE OF BRONCHODILATOR ("RESCUE") INHALERS: There is a risk from using your bronchodilator too frequently.  The risk is that over-reliance on a medication which only relaxes the muscles surrounding the breathing tubes can reduce the effectiveness of medications prescribed to reduce swelling and congestion of the tubes themselves.  Although you feel brief relief from the bronchodilator inhaler, your asthma may actually be worsening with the tubes becoming more swollen and filled with mucus.  This can delay other crucial treatments, such as oral steroid medications. If you need to use a bronchodilator inhaler daily, several times per day, you should discuss this with your provider.  There are probably better treatments that could be used  to keep your asthma under control.     HOME CARE . Only take medications as instructed by your medical team. . Complete the entire course of an antibiotic. . Drink plenty of fluids and get plenty of rest. . Avoid close contacts especially the very young and the elderly . Cover your mouth if you cough or cough into your sleeve. . Always remember to wash your hands . A steam or ultrasonic humidifier can help congestion.   GET HELP RIGHT AWAY IF: . You develop worsening fever. . You become short of breath . You cough up blood. . Your symptoms persist after you have completed your treatment plan MAKE SURE YOU   Understand these instructions.  Will watch your condition.  Will get help right away if you are not doing well or get worse.  Your e-visit answers were reviewed by a board certified advanced clinical practitioner to complete your personal care plan.  Depending on the condition, your plan could have included both over the counter or prescription medications. If there is a problem please reply  once you have received a response from your provider. Your safety is important to 30m.  If you have drug allergies check your prescription carefully.    You can use MyChart to ask questions about today's visit, request a non-urgent call back, or ask for a work or school excuse for 24 hours related to this e-Visit. If it has been greater than 24 hours you will need to follow up with your provider, or enter a new e-Visit to address  those concerns. You will get an e-mail in the next two days asking about your experience.  I hope that your e-visit has been valuable and will speed your recovery. Thank you for using e-visits.  5-10 minutes spent reviewing and documenting in chart.

## 2021-07-04 ENCOUNTER — Telehealth: Payer: PRIVATE HEALTH INSURANCE | Admitting: Nurse Practitioner

## 2021-07-04 DIAGNOSIS — H9201 Otalgia, right ear: Secondary | ICD-10-CM

## 2021-07-04 MED ORDER — AMOXICILLIN-POT CLAVULANATE 875-125 MG PO TABS: 1.0000 | ORAL_TABLET | Freq: Two times a day (BID) | ORAL | 0 refills | Status: DC

## 2021-07-04 NOTE — Progress Notes (Signed)
E Visit for Swimmer's Ear  We are sorry that you are not feeling well. Here is how we plan to help!  Ear infection  Augmentin 625mg  one tablet by mouth twice a day for 7 days  In certain cases swimmer's ear may progress to a more serious bacterial infection of the middle or inner ear.  If you have a fever 102 and up and significantly worsening symptoms, this could indicate a more serious infection moving to the middle/inner and needs face to face evaluation in an office by a provider.  Your symptoms should improve over the next 3 days and should resolve in about 7 days.  HOME CARE:  Wash your hands frequently. Do not place the tip of the bottle on your ear or touch it with your fingers. You can take Acetominophen 650 mg every 4-6 hours as needed for pain.  If pain is severe or moderate, you can apply a heating pad (set on low) or hot water bottle (wrapped in a towel) to outer ear for 20 minutes.  This will also increase drainage. Avoid ear plugs Do not use Q-tips After showers, help the water run out by tilting your head to one side.  GET HELP RIGHT AWAY IF:  Fever is over 102.2 degrees. You develop progressive ear pain or hearing loss. Ear symptoms persist longer than 3 days after treatment.  MAKE SURE YOU:  Understand these instructions. Will watch your condition. Will get help right away if you are not doing well or get worse.  TO PREVENT SWIMMER'S EAR: Use a bathing cap or custom fitted swim molds to keep your ears dry. Towel off after swimming to dry your ears. Tilt your head or pull your earlobes to allow the water to escape your ear canal. If there is still water in your ears, consider using a hairdryer on the lowest setting.   Thank you for choosing an e-visit.  Your e-visit answers were reviewed by a board certified advanced clinical practitioner to complete your personal care plan. Depending upon the condition, your plan could have included both over the counter or  prescription medications.  Please review your pharmacy choice. Make sure the pharmacy is open so you can pick up prescription now. If there is a problem, you may contact your provider through and have the prescription routed to another pharmacy.  Your safety is important to [employer]. If you have drug allergies check your prescription carefully.   For the next 24 hours you can use MyChart to ask questions about today's visit, request a non-urgent call back, or ask for a work or school excuse. You will get an email in the next two days asking about your experience. I hope that your e-visit has been valuable and will speed your recovery.   5-10 minutes spent reviewing and documenting in chart.

## 2021-12-15 ENCOUNTER — Telehealth: Payer: PRIVATE HEALTH INSURANCE | Admitting: Nurse Practitioner

## 2021-12-15 DIAGNOSIS — J00 Acute nasopharyngitis [common cold]: Secondary | ICD-10-CM | POA: Diagnosis not present

## 2021-12-15 DIAGNOSIS — H65111 Acute and subacute allergic otitis media (mucoid) (sanguinous) (serous), right ear: Secondary | ICD-10-CM | POA: Diagnosis not present

## 2021-12-15 MED ORDER — AMOXICILLIN-POT CLAVULANATE 875-125 MG PO TABS: 1.0000 | ORAL_TABLET | Freq: Two times a day (BID) | ORAL | 0 refills | Status: DC

## 2021-12-15 MED ORDER — BENZONATATE 100 MG PO CAPS: 100.0000 mg | ORAL_CAPSULE | Freq: Three times a day (TID) | ORAL | 0 refills | Status: DC | PRN

## 2021-12-15 MED ORDER — FLUTICASONE PROPIONATE 50 MCG/ACT NA SUSP: 2.0000 | Freq: Every day | NASAL | 6 refills | Status: AC

## 2021-12-15 NOTE — Progress Notes (Signed)

## 2022-02-23 ENCOUNTER — Telehealth: Payer: PRIVATE HEALTH INSURANCE | Admitting: Physician Assistant

## 2022-02-23 DIAGNOSIS — H60333 Swimmer's ear, bilateral: Secondary | ICD-10-CM

## 2022-02-23 MED ORDER — AMOXICILLIN-POT CLAVULANATE 875-125 MG PO TABS: 1.0000 | ORAL_TABLET | Freq: Two times a day (BID) | ORAL | 0 refills | Status: DC

## 2022-02-23 MED ORDER — NEOMYCIN-POLYMYXIN-HC 3.5-10000-1 OT SOLN
3.0000 [drp] | Freq: Four times a day (QID) | OTIC | 0 refills | Status: DC
Start: 2022-02-23 — End: 2022-06-27

## 2022-02-23 NOTE — Progress Notes (Signed)
E Visit for Swimmer's Ear ? ?We are sorry that you are not feeling well. Here is how we plan to help! ? ?Based on what you have shared with me it looks like you have swimmers ear. Swimmer's ear is a redness or swelling, irritation, or infection of your outer ear canal.  These symptoms usually occur within a few days of swimming.  Your ear canal is a tube that goes from the opening of the ear to the eardrum.  When water stays in your ear canal, germs can grow.  This is a painful condition that often happens to children and swimmers of all ages.  It is not contagious and oral antibiotics are not required to treat uncomplicated swimmer's ear.  The usual symptoms include: Itching inside the ear, Redness or a sense of swelling in the ear, Pain when the ear is tugged on when pressure is placed on the ear, Pus draining from the infected ear. and I have prescribed: Neomycin 0.35%, polymyxin B 10,000 units/mL, and hydrocortisone 0,5% otic solution 4 drops in affected ears four times a day for 7 days ? ?Based on what you have told me you may have a bacterial infection. In addition to the ear drops I have prescribed an oral antibiotic: and Augmentin 625mg  one tablet by mouth twice a day for 10 days ? ?In certain cases swimmer's ear may progress to a more serious bacterial infection of the middle or inner ear.  If you have a fever 102 and up and significantly worsening symptoms, this could indicate a more serious infection moving to the middle/inner and needs face to face evaluation in an office by a provider. ? ?Your symptoms should improve over the next 3 days and should resolve in about 7 days. ? ?HOME CARE: ? ?Wash your hands frequently. ?Do not place the tip of the bottle on your ear or touch it with your fingers. ?You can take Acetominophen 650 mg every 4-6 hours as needed for pain.  If pain is severe or moderate, you can apply a heating pad (set on low) or hot water bottle (wrapped in a towel) to outer ear for 20  minutes.  This will also increase drainage. ?Avoid ear plugs ?Do not use Q-tips ?After showers, help the water run out by tilting your head to one side. ? ?GET HELP RIGHT AWAY IF: ? ?Fever is over 102.2 degrees. ?You develop progressive ear pain or hearing loss. ?Ear symptoms persist longer than 3 days after treatment. ? ?MAKE SURE YOU: ? ?Understand these instructions. ?Will watch your condition. ?Will get help right away if you are not doing well or get worse. ? ?TO PREVENT SWIMMER'S EAR: ?Use a bathing cap or custom fitted swim molds to keep your ears dry. ?Towel off after swimming to dry your ears. ?Tilt your head or pull your earlobes to allow the water to escape your ear canal. ?If there is still water in your ears, consider using a hairdryer on the lowest setting. ? ? ?Thank you for choosing an e-visit. ? ?Your e-visit answers were reviewed by a board certified advanced clinical practitioner to complete your personal care plan. Depending upon the condition, your plan could have included both over the counter or prescription medications. ? ?Please review your pharmacy choice. Make sure the pharmacy is open so you can pick up prescription now. If there is a problem, you may contact your provider through [employer] and have the prescription routed to another pharmacy.  Your safety is important  to [place]. If you have drug allergies check your prescription carefully.  ? ?For the next 24 hours you can use MyChart to ask questions about today's visit, request a non-urgent call back, or ask for a work or school excuse. ?You will get an email in the next two days asking about your experience. I hope that your e-visit has been valuable and will speed your recovery. ? ? ? ?

## 2022-02-23 NOTE — Progress Notes (Signed)
I have spent 5 minutes in review of e-visit questionnaire, review and updating patient chart, medical decision making and response to patient.   Darrel Gloss Cody Karen Kinnard, PA-C    

## 2022-04-18 ENCOUNTER — Telehealth: Payer: PRIVATE HEALTH INSURANCE | Admitting: Physician Assistant

## 2022-04-18 DIAGNOSIS — H66006 Acute suppurative otitis media without spontaneous rupture of ear drum, recurrent, bilateral: Secondary | ICD-10-CM

## 2022-04-18 MED ORDER — AMOXICILLIN-POT CLAVULANATE 875-125 MG PO TABS: 1.0000 | ORAL_TABLET | Freq: Two times a day (BID) | ORAL | 0 refills | Status: DC

## 2022-04-18 NOTE — Progress Notes (Signed)
E Visit for Swimmer's Ear ? ?We are sorry that you are not feeling well. Here is how we plan to help! ? ?Based on what you have told me you may have a bacterial infection. In addition to the ear drops I have prescribed an oral antibiotic: and Augmentin 875-125mg one tablet by mouth twice a day for 10 days ? ?In certain cases swimmer's ear may progress to a more serious bacterial infection of the middle or inner ear.  If you have a fever 102 and up and significantly worsening symptoms, this could indicate a more serious infection moving to the middle/inner and needs face to face evaluation in an office by a provider. ? ?Your symptoms should improve over the next 3 days and should resolve in about 7 days. ? ?HOME CARE: ? ?Wash your hands frequently. ?Do not place the tip of the bottle on your ear or touch it with your fingers. ?You can take Acetominophen 650 mg every 4-6 hours as needed for pain.  If pain is severe or moderate, you can apply a heating pad (set on low) or hot water bottle (wrapped in a towel) to outer ear for 20 minutes.  This will also increase drainage. ?Avoid ear plugs ?Do not use Q-tips ?After showers, help the water run out by tilting your head to one side. ? ?GET HELP RIGHT AWAY IF: ? ?Fever is over 102.2 degrees. ?You develop progressive ear pain or hearing loss. ?Ear symptoms persist longer than 3 days after treatment. ? ?MAKE SURE YOU: ? ?Understand these instructions. ?Will watch your condition. ?Will get help right away if you are not doing well or get worse. ? ?TO PREVENT SWIMMER'S EAR: ?Use a bathing cap or custom fitted swim molds to keep your ears dry. ?Towel off after swimming to dry your ears. ?Tilt your head or pull your earlobes to allow the water to escape your ear canal. ?If there is still water in your ears, consider using a hairdryer on the lowest setting. ? ? ?Thank you for choosing an e-visit. ? ?Your e-visit answers were reviewed by a board certified advanced clinical  practitioner to complete your personal care plan. Depending upon the condition, your plan could have included both over the counter or prescription medications. ? ?Please review your pharmacy choice. Make sure the pharmacy is open so you can pick up prescription now. If there is a problem, you may contact your provider through MyChart messaging and have the prescription routed to another pharmacy.  Your safety is important to us. If you have drug allergies check your prescription carefully.  ? ?For the next 24 hours you can use MyChart to ask questions about today's visit, request a non-urgent call back, or ask for a work or school excuse. ?You will get an email in the next two days asking about your experience. I hope that your e-visit has been valuable and will speed your recovery. ? ? ?I provided 5 minutes of non face-to-face time during this encounter for chart review and documentation.  ? ?

## 2022-05-22 ENCOUNTER — Telehealth: Payer: PRIVATE HEALTH INSURANCE | Admitting: Family Medicine

## 2022-05-22 DIAGNOSIS — H9201 Otalgia, right ear: Secondary | ICD-10-CM

## 2022-05-22 NOTE — Progress Notes (Signed)
[place]  Needs in person assessment. She reports frequent ear infections. Has had two rounds of Augmentin since March and ear drops as well during one of those infections. Would benefit from someone looking in ear and possible ENT referral.  Message detail of this sent on this EV.

## 2022-06-27 ENCOUNTER — Telehealth: Payer: PRIVATE HEALTH INSURANCE | Admitting: Physician Assistant

## 2022-06-27 DIAGNOSIS — H60391 Other infective otitis externa, right ear: Secondary | ICD-10-CM

## 2022-06-27 MED ORDER — CIPROFLOXACIN-DEXAMETHASONE 0.3-0.1 % OT SUSP: 4.0000 [drp] | Freq: Two times a day (BID) | OTIC | 0 refills | Status: DC

## 2022-06-27 NOTE — Progress Notes (Signed)
E Visit for Swimmer's Ear  We are sorry that you are not feeling well. Here is how we plan to help!  Based on what you have shared with me it looks like you have swimmers ear. Swimmer's ear is a redness or swelling, irritation, or infection of your outer ear canal.  These symptoms usually occur within a few days of swimming.  Your ear canal is a tube that goes from the opening of the ear to the eardrum.  When water stays in your ear canal, germs can grow.  This is a painful condition that often happens to children and swimmers of all ages.  It is not contagious and oral antibiotics are not required to treat uncomplicated swimmer's ear.  The usual symptoms include: Itching inside the ear, Redness or a sense of swelling in the ear, Pain when the ear is tugged on when pressure is placed on the ear, Pus draining from the infected ear.  I have prescribed Ciprodex ear drops. Place 4 drops in the right ear twice daily for 7 days.   In certain cases swimmer's ear may progress to a more serious bacterial infection of the middle or inner ear.  If you have a fever 102 and up and significantly worsening symptoms, this could indicate a more serious infection moving to the middle/inner and needs face to face evaluation in an office by a provider.  Your symptoms should improve over the next 3 days and should resolve in about 7 days.  HOME CARE:  Wash your hands frequently. Do not place the tip of the bottle on your ear or touch it with your fingers. You can take Acetominophen 650 mg every 4-6 hours as needed for pain.  If pain is severe or moderate, you can apply a heating pad (set on low) or hot water bottle (wrapped in a towel) to outer ear for 20 minutes.  This will also increase drainage. Avoid ear plugs Do not use Q-tips After showers, help the water run out by tilting your head to one side.  GET HELP RIGHT AWAY IF:  Fever is over 102.2 degrees. You develop progressive ear pain or hearing loss. Ear  symptoms persist longer than 3 days after treatment.  MAKE SURE YOU:  Understand these instructions. Will watch your condition. Will get help right away if you are not doing well or get worse.  TO PREVENT SWIMMER'S EAR: Use a bathing cap or custom fitted swim molds to keep your ears dry. Towel off after swimming to dry your ears. Tilt your head or pull your earlobes to allow the water to escape your ear canal. If there is still water in your ears, consider using a hairdryer on the lowest setting.   Thank you for choosing an e-visit.  Your e-visit answers were reviewed by a board certified advanced clinical practitioner to complete your personal care plan. Depending upon the condition, your plan could have included both over the counter or prescription medications.  Please review your pharmacy choice. Make sure the pharmacy is open so you can pick up prescription now. If there is a problem, you may contact your provider through [employer] and have the prescription routed to another pharmacy.  Your safety is important to [place]. If you have drug allergies check your prescription carefully.   For the next 24 hours you can use MyChart to ask questions about today's visit, request a non-urgent call back, or ask for a work or school excuse. You will get an email  in the next two days asking about your experience. I hope that your e-visit has been valuable and will speed your recovery.   I provided 5 minutes of non face-to-face time during this encounter for chart review and documentation.

## 2022-08-26 ENCOUNTER — Telehealth: Payer: PRIVATE HEALTH INSURANCE | Admitting: Physician Assistant

## 2022-08-26 DIAGNOSIS — H60393 Other infective otitis externa, bilateral: Secondary | ICD-10-CM | POA: Diagnosis not present

## 2022-08-27 MED ORDER — CIPROFLOXACIN-DEXAMETHASONE 0.3-0.1 % OT SUSP: 4.0000 [drp] | Freq: Two times a day (BID) | OTIC | 0 refills | Status: DC

## 2022-08-27 NOTE — Progress Notes (Signed)
E Visit for Swimmer's Ear  We are sorry that you are not feeling well. Here is how we plan to help!  Based on what you have shared with me it looks like you have swimmers ear. Swimmer's ear is a redness or swelling, irritation, or infection of your outer ear canal.  These symptoms usually occur within a few days of swimming.  Your ear canal is a tube that goes from the opening of the ear to the eardrum.  When water stays in your ear canal, germs can grow.  This is a painful condition that often happens to children and swimmers of all ages.  It is not contagious and oral antibiotics are not required to treat uncomplicated swimmer's ear.  The usual symptoms include: Itching inside the ear, Redness or a sense of swelling in the ear, Pain when the ear is tugged on when pressure is placed on the ear, Pus draining from the infected ear. and I have prescribed: Ciprofloxin 0.3% and dexamethasone 0.1% otic suspension 4 drops in affected ears twice daily for 7 days.  In certain cases swimmer's ear may progress to a more serious bacterial infection of the middle or inner ear.  If you have a fever 102 and up and significantly worsening symptoms, this could indicate a more serious infection moving to the middle/inner and needs face to face evaluation in an office by a provider.  Your symptoms should improve over the next 3 days and should resolve in about 7 days.  HOME CARE:  Wash your hands frequently. Do not place the tip of the bottle on your ear or touch it with your fingers. You can take Acetominophen 650 mg every 4-6 hours as needed for pain.  If pain is severe or moderate, you can apply a heating pad (set on low) or hot water bottle (wrapped in a towel) to outer ear for 20 minutes.  This will also increase drainage. Avoid ear plugs Do not use Q-tips After showers, help the water run out by tilting your head to one side.  GET HELP RIGHT AWAY IF:  Fever is over 102.2 degrees. You develop progressive  ear pain or hearing loss. Ear symptoms persist longer than 3 days after treatment.  MAKE SURE YOU:  Understand these instructions. Will watch your condition. Will get help right away if you are not doing well or get worse.  TO PREVENT SWIMMER'S EAR: Use a bathing cap or custom fitted swim molds to keep your ears dry. Towel off after swimming to dry your ears. Tilt your head or pull your earlobes to allow the water to escape your ear canal. If there is still water in your ears, consider using a hairdryer on the lowest setting.   Thank you for choosing an e-visit.  Your e-visit answers were reviewed by a board certified advanced clinical practitioner to complete your personal care plan. Depending upon the condition, your plan could have included both over the counter or prescription medications.  Please review your pharmacy choice. Make sure the pharmacy is open so you can pick up prescription now. If there is a problem, you may contact your provider through [employer] and have the prescription routed to another pharmacy.  Your safety is important to [place]. If you have drug allergies check your prescription carefully.   For the next 24 hours you can use MyChart to ask questions about today's visit, request a non-urgent call back, or ask for a work or school excuse. You will get an  email in the next two days asking about your experience. I hope that your e-visit has been valuable and will speed your recovery.   I provided 5 minutes of non face-to-face time during this encounter for chart review and documentation.

## 2023-01-13 ENCOUNTER — Telehealth: Payer: Self-pay | Admitting: Nurse Practitioner

## 2023-01-13 DIAGNOSIS — U071 COVID-19: Secondary | ICD-10-CM

## 2023-01-13 MED ORDER — NIRMATRELVIR/RITONAVIR (PAXLOVID)TABLET: 3.0000 | ORAL_TABLET | Freq: Two times a day (BID) | ORAL | 0 refills | Status: AC

## 2023-01-13 NOTE — Progress Notes (Signed)
Virtual Visit Consent   [name], you are scheduled for a virtual visit with a [facility] provider today. Just as with appointments in the office, your consent must be obtained to participate. Your consent will be active for this visit and any virtual visit you may have with one of our providers in the next 365 days. If you have a MyChart account, a copy of this consent can be sent to you electronically.  As this is a virtual visit, video technology does not allow for your provider to perform a traditional examination. This may limit your provider's ability to fully assess your condition. If your provider identifies any concerns that need to be evaluated in person or the need to arrange testing (such as labs, EKG, etc.), we will make arrangements to do so. Although advances in technology are sophisticated, we cannot ensure that it will always work on either your end or our end. If the connection with a video visit is poor, the visit may have to be switched to a telephone visit. With either a video or telephone visit, we are not always able to ensure that we have a secure connection.  By engaging in this virtual visit, you consent to the provision of healthcare and authorize for your insurance to be billed (if applicable) for the services provided during this visit. Depending on your insurance coverage, you may receive a charge related to this service.  I need to obtain your verbal consent now. Are you willing to proceed with your visit today? Aleesha Ringstad has provided verbal consent on 01/13/2023 for a virtual visit (video or telephone). Gildardo Pounds, NP  Date: 01/13/2023 12:45 PM  Virtual Visit via Video Note   I, Gildardo Pounds, connected with  Veronica Waters  ([id number], 10-25-1991) on 01/13/23 at 12:45 PM EST by a video-enabled telemedicine application and verified that I am speaking with the correct person using two identifiers.  Location: Patient: Virtual Visit Location Patient:  Home Provider: Virtual Visit Location Provider: Home Office   I discussed the limitations of evaluation and management by telemedicine and the availability of in person appointments. The patient expressed understanding and agreed to proceed.    History of Present Illness: Veronica Waters is a 32 y.o. who identifies as a female who was assigned female at birth, and is being seen today for [address].  Veronica Waters tested positive for COVID last night. She endorses the following symptoms with one day onset: productive cough, sore throat, chest congestion and fever with last recorded 99.1  Problems:  Patient Active Problem List   Diagnosis Date Noted   Anxiety 12/07/2020   Annual physical exam 09/05/2020   BMI 40.0-44.9, adult ([place]) 09/05/2020   Acute intractable tension-type headache 09/05/2020   Bipolar affective disorder ([place]) 07/17/2018   Overdose 07/14/2018   Suicide attempt ([place])    Endotracheally intubated    Posttraumatic stress disorder    Borderline personality disorder ([place])    Diabetes mellitus type 2 in obese ([place])    Bipolar 1 disorder, mixed, severe ([facility]) 06/28/2018   Tachycardia 01/18/2018   Severe recurrent major depression without psychotic features ([place]) 01/05/2018   MDD (major depressive disorder), recurrent severe, 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 (Take nirmatrelvir 150 mg two tablets twice daily for 5 days and ritonavir 100 mg one tablet twice daily for 5 days) Patient GFR is 123, Disp: 30 tablet, Rfl:  0   albuterol (VENTOLIN HFA) 108 (90 Base) MCG/ACT inhaler, Inhale 2 puffs into the lungs every 6 (six) hours as needed for wheezing or shortness of breath., Disp: 8 g, Rfl: 0   Ascorbic Acid (VITAMIN C) 1000 MG tablet, Take 2,000 mg by mouth daily., Disp: , Rfl:    Cholecalciferol (VITAMIN D) 50 MCG (2000 UT) CAPS, Take by mouth., Disp: , Rfl:    ciprofloxacin-dexamethasone (CIPRODEX) OTIC suspension, Place 4 drops into the right ear 2 (two) times daily. X 7 days, Disp: 7.5 mL, Rfl: 0   fluticasone (FLONASE) 50 MCG/ACT nasal spray, Place 2 sprays into both nostrils daily., Disp: 16 g, Rfl: 6   Multiple Vitamins-Minerals (MULTIVITAMIN WOMEN PO), Take by mouth., Disp: , Rfl:    naproxen (NAPROSYN) 500 MG tablet, Take 1 tablet (500 mg total) by mouth 2 (two) times daily with a meal., Disp: 60 tablet, Rfl: 0   propranolol (INDERAL) 20 MG tablet, Take 1 tablet (20 mg total) by mouth 2 (two) times daily., Disp: 180 tablet, Rfl: 0   Rimegepant Sulfate (NURTEC) 75 MG TBDP, Take 1 tablet (75 mg) orally once per 24 hours placed on or under the tongue., Disp: 8 tablet, Rfl: 3   tiZANidine (ZANAFLEX) 2 MG tablet, Take 1 tablet (2 mg total) by mouth every 8 (eight) hours as needed for muscle spasms., Disp: 20 tablet, Rfl: 0   Zinc-Vitamin C (ZINC-A-COLD/VITAMIN C MT), Use as directed in the mouth or throat., Disp: , Rfl:   Observations/Objective: Patient is well-developed, well-nourished in no acute distress.  Resting comfortably at home.  Head is normocephalic, atraumatic.  No labored breathing.  Speech is clear and coherent with logical content.  Patient is alert and oriented at baseline.    Assessment and Plan: 1. Positive self-administered antigen test for COVID-19 - nirmatrelvir/ritonavir (PAXLOVID) 20 x 150 MG & 10 x 100MG  TABS; Take 3 tablets by mouth 2 (two) times daily for 5 days. (Take nirmatrelvir 150 mg two tablets twice daily for 5 days and ritonavir 100 mg one tablet twice daily for 5 days)  Patient GFR is 123  Dispense: 30 tablet; Refill: 0   Please keep well-hydrated and get plenty of rest. Start a saline nasal rinse to flush out your nasal passages. You can use plain Mucinex to help thin congestion. If you have a humidifier, you can use this daily as needed.    You are to wear a mask for 5 days from onset of your symptoms.  After day 5, if you have had no fever and you are feeling better with NO symptoms, you can end masking. Keep in mind you can be contagious 10 days from the onset of symptoms  After day 5 if you have a fever or are having significant symptoms, please wear your mask for full 10 days.   If you note any worsening of symptoms, any significant shortness of breath or any chest pain, please seek ER evaluation ASAP.  Please do not delay care!    If you note any worsening of symptoms, any significant shortness of breath or any chest pain, please  seek ER evaluation ASAP.  Please do not delay care!   Follow Up Instructions: I discussed the assessment and treatment plan with the patient. The patient was provided an opportunity to ask questions and all were answered. The patient agreed with the plan and demonstrated an understanding of the instructions.  A copy of instructions were sent to the patient via MyChart unless otherwise noted below.    The patient was advised to call back or seek an in-person evaluation if the symptoms worsen or if the condition fails to improve as anticipated.  Time:  I spent 11 minutes with the patient via telehealth technology discussing the above problems/concerns.    Claiborne Rigg, NP

## 2023-01-13 NOTE — Patient Instructions (Signed)
Terrill Mohr, thank you for joining Gildardo Pounds, NP for today's virtual visit.  While this provider is not your primary care provider (PCP), if your PCP is located in our provider database this encounter information will be shared with them immediately following your visit.   [facility] account gives you access to today's visit and all your visits, tests, and labs performed at [facility] " click here if you don't have a [facility] account or go to [email or web address]  Consent: (Patient) Veronica Waters provided verbal consent for this virtual visit at the beginning of the encounter.  Current Medications:  Current Outpatient Medications:    nirmatrelvir/ritonavir (PAXLOVID) 20 x 150 MG & 10 x 100MG  TABS, Take 3 tablets by mouth 2 (two) times daily for 5 days. (Take nirmatrelvir 150 mg two tablets twice daily for 5 days and ritonavir 100 mg one tablet twice daily for 5 days) Patient GFR is 123, Disp: 30 tablet, Rfl: 0   albuterol (VENTOLIN HFA) 108 (90 Base) MCG/ACT inhaler, Inhale 2 puffs into the lungs every 6 (six) hours as needed for wheezing or shortness of breath., Disp: 8 g, Rfl: 0   Ascorbic Acid (VITAMIN C) 1000 MG tablet, Take 2,000 mg by mouth daily., Disp: , Rfl:    Cholecalciferol (VITAMIN D) 50 MCG (2000 UT) CAPS, Take by mouth., Disp: , Rfl:    ciprofloxacin-dexamethasone (CIPRODEX) OTIC suspension, Place 4 drops into the right ear 2 (two) times daily. X 7 days, Disp: 7.5 mL, Rfl: 0   fluticasone (FLONASE) 50 MCG/ACT nasal spray, Place 2 sprays into both nostrils daily., Disp: 16 g, Rfl: 6   Multiple Vitamins-Minerals (MULTIVITAMIN WOMEN PO), Take by mouth., Disp: , Rfl:    naproxen (NAPROSYN) 500 MG tablet, Take 1 tablet (500 mg total) by mouth 2 (two) times daily with a meal., Disp: 60 tablet, Rfl: 0   propranolol (INDERAL) 20 MG tablet, Take 1 tablet (20 mg total) by mouth 2 (two) times daily., Disp: 180 tablet, Rfl: 0   Rimegepant Sulfate  (NURTEC) 75 MG TBDP, Take 1 tablet (75 mg) orally once per 24 hours placed on or under the tongue., Disp: 8 tablet, Rfl: 3   tiZANidine (ZANAFLEX) 2 MG tablet, Take 1 tablet (2 mg total) by mouth every 8 (eight) hours as needed for muscle spasms., Disp: 20 tablet, Rfl: 0   Zinc-Vitamin C (ZINC-A-COLD/VITAMIN C MT), Use as directed in the mouth or throat., Disp: , Rfl:    Medications ordered in this encounter:  Meds ordered this encounter  Medications   nirmatrelvir/ritonavir (PAXLOVID) 20 x 150 MG & 10 x 100MG  TABS    Sig: Take 3 tablets by mouth 2 (two) times daily for 5 days. (Take nirmatrelvir 150 mg two tablets twice daily for 5 days and ritonavir 100 mg one tablet twice daily for 5 days) Patient GFR is 123    Dispense:  30 tablet    Refill:  0    Order Specific Question:   Supervising Provider    Answer:   Chase Picket [id number]     *If you need refills on other medications prior to your next appointment, please contact your pharmacy*  Follow-Up: Call back or seek an in-person evaluation if the symptoms worsen or if the condition fails to improve as anticipated.  [place] [phone]  Other Instructions  Please keep well-hydrated and get plenty of rest. Start a saline nasal rinse to flush out your nasal passages. You  can use plain Mucinex to help thin congestion. If you have a humidifier, you can use this daily as needed.    You are to wear a mask for 5 days from onset of your symptoms.  After day 5, if you have had no fever and you are feeling better with NO symptoms, you can end masking. Keep in mind you can be contagious 10 days from the onset of symptoms  After day 5 if you have a fever or are having significant symptoms, please wear your mask for full 10 days.   If you note any worsening of symptoms, any significant shortness of breath or any chest pain, please seek ER evaluation ASAP.  Please do not delay care!    If you note any worsening of  symptoms, any significant shortness of breath or any chest pain, please seek ER evaluation ASAP.  Please do not delay care!    If you have been instructed to have an in-person evaluation today at a local Urgent Care facility, please use the link below. It will take you to a list of all of our available [facility], including address, phone number and hours of operation. Please do not delay care.  [facility]  If you or a family member do not have a primary care provider, use the link below to schedule a visit and establish care. When you choose a [facility] primary care physician or advanced practice provider, you gain a long-term partner in health. Find a Primary Care Provider  Learn more about 's in-office and virtual care options: [facility]

## 2023-05-18 ENCOUNTER — Telehealth: Payer: Self-pay | Admitting: Family

## 2023-05-18 DIAGNOSIS — H65191 Other acute nonsuppurative otitis media, right ear: Secondary | ICD-10-CM

## 2023-05-18 MED ORDER — AMOXICILLIN-POT CLAVULANATE 875-125 MG PO TABS: 1.0000 | ORAL_TABLET | Freq: Two times a day (BID) | ORAL | 0 refills | Status: DC

## 2023-05-18 NOTE — Progress Notes (Signed)
E Visit for Ear Pain - Swimmer's Ear  We are sorry that you are not feeling well. Here is how we plan to help!  Based on what you have shared with me it looks like you have ear infection.    I have prescribed Augmentin 875-125 mg one tablet by mouth twice a day for 10 days   Your symptoms should improve over the next 3 days and should resolve in about 7 days.  Be sure to complete ALL of your prescription.  HOME CARE: Wash your hands frequently. If you are prescribed an ear drop, do not place the tip of the bottle on your ear or touch it with your fingers. You can take Acetaminophen 650 mg every 4-6 hours as needed for pain.  If pain is severe or moderate, you can apply a heating pad (set on low) or hot water bottle (wrapped in a towel) to outer ear for 20 minutes.  This will also increase drainage. Avoid ear plugs Do not go swimming until the symptoms are gone Do not use Q-tips After showers, help the water run out by tilting your head to one side.   GET HELP RIGHT AWAY IF: Fever is over 102.2 degrees. You develop progressive ear pain or hearing loss. Ear symptoms persist longer than 3 days after treatment.  MAKE SURE YOU: Understand these instructions. Will watch your condition. Will get help right away if you are not doing well or get worse.  TO PREVENT SWIMMER'S EAR: Use a bathing cap or custom fitted swim molds to keep your ears dry. Towel off after swimming to dry your ears. Tilt your head or pull your earlobes to allow the water to escape your ear canal. If there is still water in your ears, consider using a hairdryer on the lowest setting.  Thank you for choosing an e-visit.  Your e-visit answers were reviewed by a board certified advanced clinical practitioner to complete your personal care plan. Depending upon the condition, your plan could have included both over the counter or prescription medications.  Please review your pharmacy choice. Make sure the pharmacy is  open so you can pick up the prescription now. If there is a problem, you may contact your provider through [employer] and have the prescription routed to another pharmacy.  Your safety is important to [place]. If you have drug allergies check your prescription carefully.   For the next 24 hours you can use MyChart to ask questions about today's visit, request a non-urgent call back, or ask for a work or school excuse. You will get an email with a survey after your eVisit asking about your experience. We would appreciate your feedback. I hope that your e-visit has been valuable and will aid in your recovery.   Approximately 5 minutes was spent documenting and reviewing patient's chart.

## 2024-02-20 ENCOUNTER — Telehealth: Payer: Self-pay | Admitting: Family Medicine

## 2024-02-20 DIAGNOSIS — M545 Low back pain, unspecified: Secondary | ICD-10-CM

## 2024-02-20 NOTE — Progress Notes (Signed)
  Because this is related to a fall we need to have you seen in person your condition warrants further evaluation and I recommend that you be seen in a face-to-face visit.   NOTE: There will be NO CHARGE for this E-Visit   If you are having a true medical emergency, please call 911.

## 2024-05-09 ENCOUNTER — Telehealth: Payer: No Typology Code available for payment source | Admitting: Nurse Practitioner

## 2024-05-09 DIAGNOSIS — K089 Disorder of teeth and supporting structures, unspecified: Secondary | ICD-10-CM | POA: Diagnosis not present

## 2024-05-09 MED ORDER — NAPROXEN 500 MG PO TABS: 500.0000 mg | ORAL_TABLET | Freq: Two times a day (BID) | ORAL | 0 refills | Status: AC

## 2024-05-09 MED ORDER — PENICILLIN V POTASSIUM 500 MG PO TABS: 500.0000 mg | ORAL_TABLET | Freq: Three times a day (TID) | ORAL | 0 refills | Status: AC

## 2024-05-09 NOTE — Progress Notes (Signed)
 E-Visit for Dental Pain  We are sorry that you are not feeling well.  Here is how we plan to help!  Based on what you have shared with me in the questionnaire, it sounds like you have nerve pain from a fractured tooth  Pen VK 500mg  3 times a day for 7 days and Naprosyn  500mg  2 times a day for 7 days for discomfort a It is imperative that you see a dentist within 10 days of this eVisit to determine the cause of the dental pain and be sure it is adequately treated  A toothache or tooth pain is caused when the nerve in the root of a tooth or surrounding a tooth is irritated. Dental (tooth) infection, decay, injury, or loss of a tooth are the most common causes of dental pain. Pain may also occur after an extraction (tooth is pulled out). Pain sometimes originates from other areas and radiates to the jaw, thus appearing to be tooth pain.Bacteria growing inside your mouth can contribute to gum disease and dental decay, both of which can cause pain. A toothache occurs from inflammation of the central portion of the tooth called pulp. The pulp contains nerve endings that are very sensitive to pain. Inflammation to the pulp or pulpitis may be caused by dental cavities, trauma, and infection.    HOME CARE:   For toothaches: Over-the-counter pain medications such as acetaminophen  or ibuprofen may be used. Take these as directed on the package while you arrange for a dental appointment. Avoid very cold or hot foods, because they may make the pain worse. You may get relief from biting on a cotton ball soaked in oil of cloves. You can get oil of cloves at most drug stores.  For jaw pain:  Aspirin may be helpful for problems in the joint of the jaw in adults. If pain happens every time you open your mouth widely, the temporomandibular joint (TMJ) may be the source of the pain. Yawning or taking a large bite of food may worsen the pain. An appointment with your doctor or dentist will help you find the  cause.     GET HELP RIGHT AWAY IF:  You have a high fever or chills If you have had a recent head or face injury and develop headache, light headedness, nausea, vomiting, or other symptoms that concern you after an injury to your face or mouth, you could have a more serious injury in addition to your dental injury. A facial rash associated with a toothache: This condition may improve with medication. Contact your doctor for them to decide what is appropriate. Any jaw pain occurring with chest pain: Although jaw pain is most commonly caused by dental disease, it is sometimes referred pain from other areas. People with heart disease, especially people who have had stents placed, people with diabetes, or those who have had heart surgery may have jaw pain as a symptom of heart attack or angina. If your jaw or tooth pain is associated with lightheadedness, sweating, or shortness of breath, you should see a doctor as soon as possible. Trouble swallowing or excessive pain or bleeding from gums: If you have a history of a weakened immune system, diabetes, or steroid use, you may be more susceptible to infections. Infections can often be more severe and extensive or caused by unusual organisms. Dental and gum infections in people with these conditions may require more aggressive treatment. An abscess may need draining or IV antibiotics, for example.  MAKE SURE YOU  Understand these instructions. Will watch your condition. Will get help right away if you are not doing well or get worse.  Thank you for choosing an e-visit.  Your e-visit answers were reviewed by a board certified advanced clinical practitioner to complete your personal care plan. Depending upon the condition, your plan could have included both over the counter or prescription medications.  Please review your pharmacy choice. Make sure the pharmacy is open so you can pick up prescription now. If there is a problem, you may contact your  provider through [employer] and have the prescription routed to another pharmacy.  Your safety is important to us . If you have drug allergies check your prescription carefully.   For the next 24 hours you can use MyChart to ask questions about today's visit, request a non-urgent call back, or ask for a work or school excuse. You will get an email in the next two days asking about your experience. I hope that your e-visit has been valuable and will speed your recovery.

## 2024-05-09 NOTE — Progress Notes (Signed)
 I have spent 5 minutes in review of e-visit questionnaire, review and updating patient chart, medical decision making and response to patient.   Claiborne Rigg, NP

## 2024-06-15 ENCOUNTER — Telehealth: Admitting: Physician Assistant

## 2024-06-15 DIAGNOSIS — K089 Disorder of teeth and supporting structures, unspecified: Secondary | ICD-10-CM

## 2024-06-15 MED ORDER — CLINDAMYCIN HCL 300 MG PO CAPS: 300.0000 mg | ORAL_CAPSULE | Freq: Three times a day (TID) | ORAL | 0 refills | Status: AC

## 2024-06-15 NOTE — Progress Notes (Signed)
 E-Visit for Dental Pain  We are sorry that you are not feeling well.  Here is how we plan to help!  Based on what you have shared with me in the questionnaire, it sounds like you have a possible dental infection.   Clindamycin 300mg  3 times a day for 7 days  Can use Dentemp product OTC, a dental putty, to cover the broken tooth and cover any exposed nerve root.   It is imperative that you see a dentist within 10 days of this eVisit to determine the cause of the dental pain and be sure it is adequately treated  A toothache or tooth pain is caused when the nerve in the root of a tooth or surrounding a tooth is irritated. Dental (tooth) infection, decay, injury, or loss of a tooth are the most common causes of dental pain. Pain may also occur after an extraction (tooth is pulled out). Pain sometimes originates from other areas and radiates to the jaw, thus appearing to be tooth pain.Bacteria growing inside your mouth can contribute to gum disease and dental decay, both of which can cause pain. A toothache occurs from inflammation of the central portion of the tooth called pulp. The pulp contains nerve endings that are very sensitive to pain. Inflammation to the pulp or pulpitis may be caused by dental cavities, trauma, and infection.    HOME CARE:   For toothaches: Over-the-counter pain medications such as acetaminophen  or ibuprofen may be used. Take these as directed on the package while you arrange for a dental appointment. Avoid very cold or hot foods, because they may make the pain worse. You may get relief from biting on a cotton ball soaked in oil of cloves. You can get oil of cloves at most drug stores.  For jaw pain:  Aspirin may be helpful for problems in the joint of the jaw in adults. If pain happens every time you open your mouth widely, the temporomandibular joint (TMJ) may be the source of the pain. Yawning or taking a large bite of food may worsen the pain. An appointment with  your doctor or dentist will help you find the cause.     GET HELP RIGHT AWAY IF:  You have a high fever or chills If you have had a recent head or face injury and develop headache, light headedness, nausea, vomiting, or other symptoms that concern you after an injury to your face or mouth, you could have a more serious injury in addition to your dental injury. A facial rash associated with a toothache: This condition may improve with medication. Contact your doctor for them to decide what is appropriate. Any jaw pain occurring with chest pain: Although jaw pain is most commonly caused by dental disease, it is sometimes referred pain from other areas. People with heart disease, especially people who have had stents placed, people with diabetes, or those who have had heart surgery may have jaw pain as a symptom of heart attack or angina. If your jaw or tooth pain is associated with lightheadedness, sweating, or shortness of breath, you should see a doctor as soon as possible. Trouble swallowing or excessive pain or bleeding from gums: If you have a history of a weakened immune system, diabetes, or steroid use, you may be more susceptible to infections. Infections can often be more severe and extensive or caused by unusual organisms. Dental and gum infections in people with these conditions may require more aggressive treatment. An abscess may need draining or IV  antibiotics, for example.  MAKE SURE YOU   Understand these instructions. Will watch your condition. Will get help right away if you are not doing well or get worse.  Thank you for choosing an e-visit.  Your e-visit answers were reviewed by a board certified advanced clinical practitioner to complete your personal care plan. Depending upon the condition, your plan could have included both over the counter or prescription medications.  Please review your pharmacy choice. Make sure the pharmacy is open so you can pick up prescription now.  If there is a problem, you may contact your provider through [employer] and have the prescription routed to another pharmacy.  Your safety is important to us . If you have drug allergies check your prescription carefully.   For the next 24 hours you can use MyChart to ask questions about today's visit, request a non-urgent call back, or ask for a work or school excuse. You will get an email in the next two days asking about your experience. I hope that your e-visit has been valuable and will speed your recovery.   I have spent 5 minutes in review of e-visit questionnaire, review and updating patient chart, medical decision making and response to patient.   Delon CHRISTELLA Dickinson, PA-C
